# Patient Record
Sex: Female | Born: 1965
Health system: Southern US, Community
[De-identification: ages and names within clinical notes are randomized; demographics above are authoritative.]

## PROBLEM LIST (undated history)

## (undated) DIAGNOSIS — R0902 Hypoxemia: Secondary | ICD-10-CM

## (undated) DIAGNOSIS — J449 Chronic obstructive pulmonary disease, unspecified: Secondary | ICD-10-CM

## (undated) DIAGNOSIS — I10 Essential (primary) hypertension: Secondary | ICD-10-CM

## (undated) DIAGNOSIS — F419 Anxiety disorder, unspecified: Secondary | ICD-10-CM

## (undated) DIAGNOSIS — F329 Major depressive disorder, single episode, unspecified: Secondary | ICD-10-CM

## (undated) DIAGNOSIS — F1021 Alcohol dependence, in remission: Secondary | ICD-10-CM

## (undated) DIAGNOSIS — F32A Depression, unspecified: Secondary | ICD-10-CM

## (undated) DIAGNOSIS — K219 Gastro-esophageal reflux disease without esophagitis: Secondary | ICD-10-CM

## (undated) HISTORY — PX: HERNIA REPAIR: SHX51

## (undated) HISTORY — DX: Hypoxemia: R09.02

## (undated) HISTORY — PX: CHOLECYSTECTOMY: SHX55

## (undated) HISTORY — DX: Essential (primary) hypertension: I10

## (undated) HISTORY — DX: Depression, unspecified: F32.A

## (undated) HISTORY — DX: Anxiety disorder, unspecified: F41.9

## (undated) HISTORY — DX: Alcohol dependence, in remission: F10.21

## (undated) HISTORY — DX: Major depressive disorder, single episode, unspecified: F32.9

## (undated) HISTORY — PX: OTHER SURGICAL HISTORY: SHX169

## (undated) HISTORY — DX: Chronic obstructive pulmonary disease, unspecified: J44.9

## (undated) HISTORY — DX: Gastro-esophageal reflux disease without esophagitis: K21.9

## (undated) HISTORY — PX: APPENDECTOMY: SHX54

## (undated) HISTORY — PX: MINOR CARPAL TUNNEL: SHX6472

---

## 2003-02-22 ENCOUNTER — Emergency Department (HOSPITAL_COMMUNITY): Admission: EM | Admit: 2003-02-22 | Discharge: 2003-02-22 | Payer: Self-pay | Admitting: Emergency Medicine

## 2003-03-13 ENCOUNTER — Encounter: Admission: RE | Admit: 2003-03-13 | Discharge: 2003-03-13 | Payer: Self-pay | Admitting: Family Medicine

## 2003-05-18 ENCOUNTER — Encounter: Admission: RE | Admit: 2003-05-18 | Discharge: 2003-05-18 | Payer: Self-pay | Admitting: Family Medicine

## 2003-06-20 ENCOUNTER — Encounter: Admission: RE | Admit: 2003-06-20 | Discharge: 2003-06-20 | Payer: Self-pay | Admitting: Sports Medicine

## 2003-06-29 ENCOUNTER — Encounter: Admission: RE | Admit: 2003-06-29 | Discharge: 2003-06-29 | Payer: Self-pay | Admitting: Family Medicine

## 2003-07-07 ENCOUNTER — Encounter: Admission: RE | Admit: 2003-07-07 | Discharge: 2003-07-07 | Payer: Self-pay | Admitting: Sports Medicine

## 2003-08-02 ENCOUNTER — Encounter: Admission: RE | Admit: 2003-08-02 | Discharge: 2003-08-02 | Payer: Self-pay | Admitting: Family Medicine

## 2003-08-02 ENCOUNTER — Ambulatory Visit (HOSPITAL_COMMUNITY): Admission: RE | Admit: 2003-08-02 | Discharge: 2003-08-02 | Payer: Self-pay | Admitting: Family Medicine

## 2003-08-30 ENCOUNTER — Encounter: Admission: RE | Admit: 2003-08-30 | Discharge: 2003-08-30 | Payer: Self-pay | Admitting: Family Medicine

## 2003-09-05 ENCOUNTER — Encounter: Admission: RE | Admit: 2003-09-05 | Discharge: 2003-09-05 | Payer: Self-pay | Admitting: Family Medicine

## 2003-10-03 ENCOUNTER — Encounter: Admission: RE | Admit: 2003-10-03 | Discharge: 2003-10-03 | Payer: Self-pay | Admitting: Sports Medicine

## 2003-10-17 ENCOUNTER — Encounter: Admission: RE | Admit: 2003-10-17 | Discharge: 2003-10-17 | Payer: Self-pay | Admitting: Family Medicine

## 2003-11-01 ENCOUNTER — Encounter: Admission: RE | Admit: 2003-11-01 | Discharge: 2003-11-01 | Payer: Self-pay | Admitting: Family Medicine

## 2003-11-01 ENCOUNTER — Encounter
Admission: RE | Admit: 2003-11-01 | Discharge: 2003-12-05 | Payer: Self-pay | Admitting: Physical Medicine and Rehabilitation

## 2003-12-05 ENCOUNTER — Encounter
Admission: RE | Admit: 2003-12-05 | Discharge: 2004-03-04 | Payer: Self-pay | Admitting: Physical Medicine and Rehabilitation

## 2003-12-06 ENCOUNTER — Ambulatory Visit: Payer: Self-pay | Admitting: Physical Medicine and Rehabilitation

## 2003-12-13 ENCOUNTER — Ambulatory Visit: Payer: Self-pay | Admitting: Family Medicine

## 2003-12-28 ENCOUNTER — Ambulatory Visit: Payer: Self-pay | Admitting: Family Medicine

## 2004-01-03 ENCOUNTER — Ambulatory Visit: Payer: Self-pay | Admitting: Sports Medicine

## 2004-01-28 ENCOUNTER — Emergency Department (HOSPITAL_COMMUNITY): Admission: EM | Admit: 2004-01-28 | Discharge: 2004-01-28 | Payer: Self-pay | Admitting: *Deleted

## 2004-02-07 ENCOUNTER — Ambulatory Visit: Payer: Self-pay | Admitting: Family Medicine

## 2004-03-08 ENCOUNTER — Ambulatory Visit: Payer: Self-pay | Admitting: Family Medicine

## 2004-03-12 ENCOUNTER — Encounter: Admission: RE | Admit: 2004-03-12 | Discharge: 2004-04-22 | Payer: Self-pay | Admitting: Sports Medicine

## 2004-03-13 ENCOUNTER — Ambulatory Visit: Payer: Self-pay | Admitting: Internal Medicine

## 2004-03-27 ENCOUNTER — Encounter
Admission: RE | Admit: 2004-03-27 | Discharge: 2004-06-20 | Payer: Self-pay | Admitting: Physical Medicine and Rehabilitation

## 2004-03-29 ENCOUNTER — Ambulatory Visit: Payer: Self-pay | Admitting: Physical Medicine and Rehabilitation

## 2004-04-01 ENCOUNTER — Ambulatory Visit: Payer: Self-pay | Admitting: Internal Medicine

## 2004-04-09 ENCOUNTER — Ambulatory Visit: Payer: Self-pay | Admitting: Family Medicine

## 2004-04-16 ENCOUNTER — Ambulatory Visit: Payer: Self-pay | Admitting: Family Medicine

## 2004-04-19 ENCOUNTER — Ambulatory Visit: Payer: Self-pay | Admitting: Family Medicine

## 2004-04-24 ENCOUNTER — Ambulatory Visit: Payer: Self-pay | Admitting: Internal Medicine

## 2004-05-08 ENCOUNTER — Ambulatory Visit: Payer: Self-pay | Admitting: Family Medicine

## 2004-05-27 ENCOUNTER — Ambulatory Visit (HOSPITAL_COMMUNITY): Admission: RE | Admit: 2004-05-27 | Discharge: 2004-05-27 | Payer: Self-pay | Admitting: Obstetrics and Gynecology

## 2004-05-28 ENCOUNTER — Ambulatory Visit: Payer: Self-pay | Admitting: Family Medicine

## 2004-05-29 ENCOUNTER — Ambulatory Visit: Payer: Self-pay | Admitting: Physical Medicine and Rehabilitation

## 2004-05-29 ENCOUNTER — Encounter: Admission: RE | Admit: 2004-05-29 | Discharge: 2004-05-29 | Payer: Self-pay | Admitting: Obstetrics and Gynecology

## 2004-06-11 ENCOUNTER — Ambulatory Visit: Payer: Self-pay | Admitting: Family Medicine

## 2004-06-17 ENCOUNTER — Ambulatory Visit: Payer: Self-pay | Admitting: Sports Medicine

## 2004-06-20 ENCOUNTER — Encounter
Admission: RE | Admit: 2004-06-20 | Discharge: 2004-09-18 | Payer: Self-pay | Admitting: Physical Medicine and Rehabilitation

## 2004-06-25 ENCOUNTER — Ambulatory Visit (HOSPITAL_COMMUNITY): Admission: RE | Admit: 2004-06-25 | Discharge: 2004-06-25 | Payer: Self-pay | Admitting: Obstetrics and Gynecology

## 2004-07-11 ENCOUNTER — Encounter: Admission: RE | Admit: 2004-07-11 | Discharge: 2004-07-11 | Payer: Self-pay | Admitting: Obstetrics and Gynecology

## 2004-07-14 ENCOUNTER — Encounter: Admission: RE | Admit: 2004-07-14 | Discharge: 2004-07-14 | Payer: Self-pay | Admitting: Diagnostic Radiology

## 2004-07-17 ENCOUNTER — Ambulatory Visit: Payer: Self-pay | Admitting: Family Medicine

## 2004-07-26 ENCOUNTER — Ambulatory Visit: Payer: Self-pay | Admitting: Internal Medicine

## 2004-08-08 ENCOUNTER — Ambulatory Visit: Payer: Self-pay | Admitting: Family Medicine

## 2004-08-20 ENCOUNTER — Ambulatory Visit: Payer: Self-pay | Admitting: Sports Medicine

## 2004-09-02 ENCOUNTER — Ambulatory Visit (HOSPITAL_COMMUNITY): Admission: RE | Admit: 2004-09-02 | Discharge: 2004-09-02 | Payer: Self-pay | Admitting: General Surgery

## 2004-09-23 ENCOUNTER — Ambulatory Visit: Payer: Self-pay | Admitting: Sports Medicine

## 2004-09-24 ENCOUNTER — Ambulatory Visit (HOSPITAL_COMMUNITY): Admission: RE | Admit: 2004-09-24 | Discharge: 2004-09-24 | Payer: Self-pay | Admitting: Family Medicine

## 2004-10-22 ENCOUNTER — Ambulatory Visit: Payer: Self-pay | Admitting: Family Medicine

## 2004-10-29 ENCOUNTER — Ambulatory Visit: Payer: Self-pay | Admitting: Physical Medicine and Rehabilitation

## 2004-10-29 ENCOUNTER — Encounter
Admission: RE | Admit: 2004-10-29 | Discharge: 2005-01-27 | Payer: Self-pay | Admitting: Physical Medicine and Rehabilitation

## 2004-11-12 ENCOUNTER — Encounter
Admission: RE | Admit: 2004-11-12 | Discharge: 2005-02-10 | Payer: Self-pay | Admitting: Physical Medicine and Rehabilitation

## 2004-11-12 ENCOUNTER — Ambulatory Visit: Payer: Self-pay | Admitting: Psychology

## 2004-11-13 ENCOUNTER — Ambulatory Visit: Payer: Self-pay | Admitting: Internal Medicine

## 2004-11-14 ENCOUNTER — Encounter: Admission: RE | Admit: 2004-11-14 | Discharge: 2004-11-14 | Payer: Self-pay | Admitting: Surgery

## 2004-11-21 ENCOUNTER — Ambulatory Visit: Payer: Self-pay | Admitting: Family Medicine

## 2004-12-26 ENCOUNTER — Ambulatory Visit: Payer: Self-pay | Admitting: Family Medicine

## 2004-12-26 ENCOUNTER — Encounter: Admission: RE | Admit: 2004-12-26 | Discharge: 2004-12-26 | Payer: Self-pay | Admitting: Surgery

## 2005-01-16 ENCOUNTER — Ambulatory Visit (HOSPITAL_BASED_OUTPATIENT_CLINIC_OR_DEPARTMENT_OTHER): Admission: RE | Admit: 2005-01-16 | Discharge: 2005-01-16 | Payer: Self-pay | Admitting: Sports Medicine

## 2005-01-19 ENCOUNTER — Ambulatory Visit: Payer: Self-pay | Admitting: Internal Medicine

## 2005-01-27 ENCOUNTER — Ambulatory Visit: Payer: Self-pay | Admitting: Family Medicine

## 2005-02-07 ENCOUNTER — Encounter
Admission: RE | Admit: 2005-02-07 | Discharge: 2005-05-08 | Payer: Self-pay | Admitting: Physical Medicine and Rehabilitation

## 2005-02-21 ENCOUNTER — Ambulatory Visit: Payer: Self-pay | Admitting: Physical Medicine and Rehabilitation

## 2005-02-24 ENCOUNTER — Ambulatory Visit: Payer: Self-pay | Admitting: Family Medicine

## 2005-02-24 ENCOUNTER — Other Ambulatory Visit: Admission: RE | Admit: 2005-02-24 | Discharge: 2005-02-24 | Payer: Self-pay | Admitting: Family Medicine

## 2005-03-24 ENCOUNTER — Ambulatory Visit: Payer: Self-pay | Admitting: Internal Medicine

## 2005-03-25 ENCOUNTER — Ambulatory Visit: Payer: Self-pay | Admitting: Family Medicine

## 2005-04-11 ENCOUNTER — Ambulatory Visit: Payer: Self-pay | Admitting: Physical Medicine and Rehabilitation

## 2005-04-15 ENCOUNTER — Ambulatory Visit (HOSPITAL_COMMUNITY)
Admission: RE | Admit: 2005-04-15 | Discharge: 2005-04-15 | Payer: Self-pay | Admitting: Physical Medicine and Rehabilitation

## 2005-04-30 ENCOUNTER — Ambulatory Visit: Payer: Self-pay | Admitting: Family Medicine

## 2005-05-09 ENCOUNTER — Ambulatory Visit: Payer: Self-pay | Admitting: Physical Medicine and Rehabilitation

## 2005-05-09 ENCOUNTER — Encounter
Admission: RE | Admit: 2005-05-09 | Discharge: 2005-08-07 | Payer: Self-pay | Admitting: Physical Medicine and Rehabilitation

## 2005-05-30 ENCOUNTER — Ambulatory Visit: Payer: Self-pay | Admitting: Family Medicine

## 2005-06-15 ENCOUNTER — Emergency Department (HOSPITAL_COMMUNITY): Admission: AD | Admit: 2005-06-15 | Discharge: 2005-06-15 | Payer: Self-pay | Admitting: Family Medicine

## 2005-06-30 ENCOUNTER — Ambulatory Visit: Payer: Self-pay | Admitting: Family Medicine

## 2005-07-30 ENCOUNTER — Ambulatory Visit: Payer: Self-pay | Admitting: Family Medicine

## 2005-09-01 ENCOUNTER — Ambulatory Visit: Payer: Self-pay | Admitting: Family Medicine

## 2005-09-18 ENCOUNTER — Ambulatory Visit: Payer: Self-pay | Admitting: Internal Medicine

## 2005-10-31 ENCOUNTER — Ambulatory Visit: Payer: Self-pay | Admitting: Family Medicine

## 2005-11-01 ENCOUNTER — Encounter: Admission: RE | Admit: 2005-11-01 | Discharge: 2005-11-01 | Payer: Self-pay | Admitting: Sports Medicine

## 2005-11-28 ENCOUNTER — Ambulatory Visit: Payer: Self-pay | Admitting: Family Medicine

## 2005-12-05 ENCOUNTER — Encounter: Admission: RE | Admit: 2005-12-05 | Discharge: 2005-12-05 | Payer: Self-pay | Admitting: Family Medicine

## 2006-01-14 ENCOUNTER — Ambulatory Visit: Payer: Self-pay | Admitting: Family Medicine

## 2006-02-03 ENCOUNTER — Ambulatory Visit: Payer: Self-pay | Admitting: Family Medicine

## 2006-02-18 ENCOUNTER — Ambulatory Visit (HOSPITAL_COMMUNITY): Admission: RE | Admit: 2006-02-18 | Discharge: 2006-02-18 | Payer: Self-pay | Admitting: Family Medicine

## 2006-02-18 ENCOUNTER — Ambulatory Visit: Payer: Self-pay | Admitting: Sports Medicine

## 2006-02-25 ENCOUNTER — Inpatient Hospital Stay (HOSPITAL_COMMUNITY): Admission: AD | Admit: 2006-02-25 | Discharge: 2006-02-27 | Payer: Self-pay

## 2006-03-12 ENCOUNTER — Encounter: Admission: RE | Admit: 2006-03-12 | Discharge: 2006-03-12 | Payer: Self-pay | Admitting: Surgery

## 2006-04-23 ENCOUNTER — Encounter (HOSPITAL_BASED_OUTPATIENT_CLINIC_OR_DEPARTMENT_OTHER): Admission: RE | Admit: 2006-04-23 | Discharge: 2006-07-22 | Payer: Self-pay | Admitting: Surgery

## 2006-04-24 ENCOUNTER — Ambulatory Visit: Payer: Self-pay | Admitting: Family Medicine

## 2006-06-03 ENCOUNTER — Ambulatory Visit: Payer: Self-pay | Admitting: Sports Medicine

## 2006-06-03 ENCOUNTER — Other Ambulatory Visit: Admission: RE | Admit: 2006-06-03 | Discharge: 2006-06-03 | Payer: Self-pay | Admitting: Family Medicine

## 2006-06-03 ENCOUNTER — Encounter: Payer: Self-pay | Admitting: Family Medicine

## 2006-06-09 ENCOUNTER — Telehealth: Payer: Self-pay | Admitting: Family Medicine

## 2006-07-01 ENCOUNTER — Ambulatory Visit: Payer: Self-pay | Admitting: Family Medicine

## 2006-07-01 DIAGNOSIS — F339 Major depressive disorder, recurrent, unspecified: Secondary | ICD-10-CM | POA: Insufficient documentation

## 2006-07-01 DIAGNOSIS — F411 Generalized anxiety disorder: Secondary | ICD-10-CM

## 2006-07-01 DIAGNOSIS — I10 Essential (primary) hypertension: Secondary | ICD-10-CM | POA: Insufficient documentation

## 2006-07-01 DIAGNOSIS — Z720 Tobacco use: Secondary | ICD-10-CM

## 2006-07-07 ENCOUNTER — Telehealth: Payer: Self-pay | Admitting: Family Medicine

## 2006-07-17 ENCOUNTER — Ambulatory Visit: Payer: Self-pay | Admitting: Internal Medicine

## 2006-07-20 ENCOUNTER — Telehealth: Payer: Self-pay | Admitting: *Deleted

## 2006-08-11 ENCOUNTER — Ambulatory Visit: Payer: Self-pay | Admitting: Family Medicine

## 2006-08-25 ENCOUNTER — Ambulatory Visit: Payer: Self-pay | Admitting: Internal Medicine

## 2006-09-08 ENCOUNTER — Telehealth: Payer: Self-pay | Admitting: Family Medicine

## 2006-10-14 ENCOUNTER — Telehealth: Payer: Self-pay | Admitting: *Deleted

## 2006-10-14 ENCOUNTER — Ambulatory Visit: Payer: Self-pay | Admitting: Family Medicine

## 2006-10-20 ENCOUNTER — Telehealth (INDEPENDENT_AMBULATORY_CARE_PROVIDER_SITE_OTHER): Payer: Self-pay | Admitting: *Deleted

## 2006-11-04 ENCOUNTER — Encounter: Payer: Self-pay | Admitting: *Deleted

## 2006-12-21 ENCOUNTER — Telehealth: Payer: Self-pay | Admitting: Family Medicine

## 2006-12-28 ENCOUNTER — Ambulatory Visit: Payer: Self-pay | Admitting: Family Medicine

## 2006-12-28 ENCOUNTER — Encounter: Payer: Self-pay | Admitting: Family Medicine

## 2007-01-26 ENCOUNTER — Ambulatory Visit: Payer: Self-pay | Admitting: Family Medicine

## 2007-02-09 ENCOUNTER — Ambulatory Visit: Payer: Self-pay | Admitting: Family Medicine

## 2007-02-09 ENCOUNTER — Telehealth: Payer: Self-pay | Admitting: *Deleted

## 2007-02-09 ENCOUNTER — Telehealth: Payer: Self-pay | Admitting: Family Medicine

## 2007-02-24 ENCOUNTER — Ambulatory Visit: Payer: Self-pay | Admitting: Sports Medicine

## 2007-02-24 DIAGNOSIS — J449 Chronic obstructive pulmonary disease, unspecified: Secondary | ICD-10-CM | POA: Insufficient documentation

## 2007-04-01 ENCOUNTER — Ambulatory Visit: Payer: Self-pay | Admitting: Family Medicine

## 2007-04-02 ENCOUNTER — Encounter: Payer: Self-pay | Admitting: Family Medicine

## 2007-04-15 ENCOUNTER — Telehealth: Payer: Self-pay | Admitting: Family Medicine

## 2007-04-16 ENCOUNTER — Ambulatory Visit: Payer: Self-pay | Admitting: Family Medicine

## 2007-04-16 ENCOUNTER — Encounter: Payer: Self-pay | Admitting: Family Medicine

## 2007-04-19 ENCOUNTER — Ambulatory Visit: Payer: Self-pay | Admitting: Internal Medicine

## 2007-05-07 ENCOUNTER — Ambulatory Visit: Payer: Self-pay | Admitting: Family Medicine

## 2007-05-13 ENCOUNTER — Telehealth: Payer: Self-pay | Admitting: *Deleted

## 2007-05-17 ENCOUNTER — Telehealth: Payer: Self-pay | Admitting: Family Medicine

## 2007-05-18 ENCOUNTER — Encounter: Payer: Self-pay | Admitting: Family Medicine

## 2007-05-21 ENCOUNTER — Telehealth: Payer: Self-pay | Admitting: Family Medicine

## 2007-06-01 ENCOUNTER — Telehealth: Payer: Self-pay | Admitting: Family Medicine

## 2007-06-04 ENCOUNTER — Telehealth: Payer: Self-pay | Admitting: Internal Medicine

## 2007-06-04 ENCOUNTER — Telehealth: Payer: Self-pay | Admitting: *Deleted

## 2007-06-25 ENCOUNTER — Ambulatory Visit: Payer: Self-pay | Admitting: Family Medicine

## 2007-07-21 ENCOUNTER — Ambulatory Visit: Payer: Self-pay | Admitting: Family Medicine

## 2007-07-23 ENCOUNTER — Encounter: Admission: RE | Admit: 2007-07-23 | Discharge: 2007-07-23 | Payer: Self-pay | Admitting: Family Medicine

## 2007-08-03 ENCOUNTER — Telehealth: Payer: Self-pay | Admitting: *Deleted

## 2007-08-04 ENCOUNTER — Ambulatory Visit: Payer: Self-pay | Admitting: Family Medicine

## 2007-08-04 ENCOUNTER — Inpatient Hospital Stay (HOSPITAL_COMMUNITY): Admission: EM | Admit: 2007-08-04 | Discharge: 2007-08-07 | Payer: Self-pay | Admitting: Emergency Medicine

## 2007-08-05 ENCOUNTER — Telehealth: Payer: Self-pay | Admitting: *Deleted

## 2007-08-11 ENCOUNTER — Telehealth: Payer: Self-pay | Admitting: *Deleted

## 2007-08-13 ENCOUNTER — Encounter: Payer: Self-pay | Admitting: Family Medicine

## 2007-08-18 ENCOUNTER — Encounter: Admission: RE | Admit: 2007-08-18 | Discharge: 2007-08-18 | Payer: Self-pay | Admitting: Surgery

## 2007-08-19 ENCOUNTER — Encounter: Payer: Self-pay | Admitting: Family Medicine

## 2007-08-23 DIAGNOSIS — K219 Gastro-esophageal reflux disease without esophagitis: Secondary | ICD-10-CM

## 2007-08-24 ENCOUNTER — Ambulatory Visit: Payer: Self-pay | Admitting: Internal Medicine

## 2007-08-25 ENCOUNTER — Ambulatory Visit: Payer: Self-pay | Admitting: Family Medicine

## 2007-08-27 ENCOUNTER — Telehealth (INDEPENDENT_AMBULATORY_CARE_PROVIDER_SITE_OTHER): Payer: Self-pay | Admitting: Family Medicine

## 2007-09-01 ENCOUNTER — Ambulatory Visit: Payer: Self-pay | Admitting: Family Medicine

## 2007-10-01 ENCOUNTER — Encounter: Payer: Self-pay | Admitting: Family Medicine

## 2007-10-01 ENCOUNTER — Ambulatory Visit: Payer: Self-pay | Admitting: Family Medicine

## 2007-10-01 LAB — CONVERTED CEMR LAB
Cholesterol: 169 mg/dL (ref 0–200)
LDL Cholesterol: 106 mg/dL — ABNORMAL HIGH (ref 0–99)
Triglycerides: 118 mg/dL (ref <150)

## 2007-10-12 ENCOUNTER — Encounter: Payer: Self-pay | Admitting: Family Medicine

## 2007-11-03 ENCOUNTER — Ambulatory Visit: Payer: Self-pay | Admitting: Family Medicine

## 2007-11-19 ENCOUNTER — Ambulatory Visit: Payer: Self-pay | Admitting: Family Medicine

## 2007-11-19 ENCOUNTER — Inpatient Hospital Stay (HOSPITAL_COMMUNITY): Admission: AD | Admit: 2007-11-19 | Discharge: 2007-11-22 | Payer: Self-pay | Admitting: Emergency Medicine

## 2007-11-23 ENCOUNTER — Telehealth: Payer: Self-pay | Admitting: Family Medicine

## 2007-12-22 ENCOUNTER — Telehealth: Payer: Self-pay | Admitting: *Deleted

## 2007-12-22 ENCOUNTER — Ambulatory Visit (HOSPITAL_COMMUNITY): Admission: RE | Admit: 2007-12-22 | Discharge: 2007-12-22 | Payer: Self-pay | Admitting: Anesthesiology

## 2007-12-24 ENCOUNTER — Ambulatory Visit: Payer: Self-pay | Admitting: Family Medicine

## 2008-01-07 ENCOUNTER — Ambulatory Visit (HOSPITAL_COMMUNITY): Admission: RE | Admit: 2008-01-07 | Discharge: 2008-01-07 | Payer: Self-pay | Admitting: Emergency Medicine

## 2008-01-19 ENCOUNTER — Telehealth: Payer: Self-pay | Admitting: Family Medicine

## 2008-01-19 ENCOUNTER — Ambulatory Visit: Payer: Self-pay | Admitting: Family Medicine

## 2008-01-26 ENCOUNTER — Telehealth: Payer: Self-pay | Admitting: *Deleted

## 2008-02-15 ENCOUNTER — Encounter: Admission: RE | Admit: 2008-02-15 | Discharge: 2008-02-15 | Payer: Self-pay | Admitting: Family Medicine

## 2008-02-15 ENCOUNTER — Ambulatory Visit: Payer: Self-pay | Admitting: Family Medicine

## 2008-02-15 DIAGNOSIS — M25569 Pain in unspecified knee: Secondary | ICD-10-CM | POA: Insufficient documentation

## 2008-02-24 ENCOUNTER — Encounter: Admission: RE | Admit: 2008-02-24 | Discharge: 2008-02-24 | Payer: Self-pay | Admitting: Anesthesiology

## 2008-03-06 ENCOUNTER — Encounter: Payer: Self-pay | Admitting: Family Medicine

## 2008-03-06 ENCOUNTER — Ambulatory Visit: Payer: Self-pay | Admitting: Family Medicine

## 2008-03-06 LAB — CONVERTED CEMR LAB
ALT: 11 units/L (ref 0–35)
Albumin: 4 g/dL (ref 3.5–5.2)
BUN: 13 mg/dL (ref 6–23)
CO2: 29 meq/L (ref 19–32)
Calcium: 9.1 mg/dL (ref 8.4–10.5)
Chloride: 99 meq/L (ref 96–112)
Creatinine, Ser: 0.77 mg/dL (ref 0.40–1.20)
Potassium: 4.5 meq/L (ref 3.5–5.3)

## 2008-03-14 ENCOUNTER — Ambulatory Visit: Payer: Self-pay | Admitting: Internal Medicine

## 2008-03-15 ENCOUNTER — Ambulatory Visit: Payer: Self-pay | Admitting: Internal Medicine

## 2008-03-15 ENCOUNTER — Encounter: Admission: RE | Admit: 2008-03-15 | Discharge: 2008-03-15 | Payer: Self-pay | Admitting: Orthopedic Surgery

## 2008-03-16 ENCOUNTER — Telehealth: Payer: Self-pay | Admitting: Internal Medicine

## 2008-03-21 ENCOUNTER — Inpatient Hospital Stay (HOSPITAL_COMMUNITY): Admission: RE | Admit: 2008-03-21 | Discharge: 2008-03-22 | Payer: Self-pay | Admitting: Orthopedic Surgery

## 2008-03-21 ENCOUNTER — Encounter (INDEPENDENT_AMBULATORY_CARE_PROVIDER_SITE_OTHER): Payer: Self-pay | Admitting: Orthopedic Surgery

## 2008-03-27 ENCOUNTER — Ambulatory Visit: Payer: Self-pay | Admitting: Family Medicine

## 2008-03-29 ENCOUNTER — Encounter: Admission: RE | Admit: 2008-03-29 | Discharge: 2008-03-29 | Payer: Self-pay | Admitting: Orthopedic Surgery

## 2008-04-13 ENCOUNTER — Encounter: Payer: Self-pay | Admitting: Family Medicine

## 2008-05-24 ENCOUNTER — Ambulatory Visit: Payer: Self-pay | Admitting: Family Medicine

## 2008-05-25 ENCOUNTER — Encounter: Admission: RE | Admit: 2008-05-25 | Discharge: 2008-05-25 | Payer: Self-pay | Admitting: Orthopedic Surgery

## 2008-05-25 ENCOUNTER — Telehealth: Payer: Self-pay | Admitting: Family Medicine

## 2008-06-06 ENCOUNTER — Encounter: Payer: Self-pay | Admitting: Family Medicine

## 2008-06-07 ENCOUNTER — Encounter: Admission: RE | Admit: 2008-06-07 | Discharge: 2008-06-07 | Payer: Self-pay | Admitting: Surgery

## 2008-06-27 ENCOUNTER — Ambulatory Visit: Payer: Self-pay | Admitting: Family Medicine

## 2008-08-02 ENCOUNTER — Encounter: Payer: Self-pay | Admitting: Family Medicine

## 2008-08-04 ENCOUNTER — Telehealth: Payer: Self-pay | Admitting: Family Medicine

## 2008-08-08 ENCOUNTER — Telehealth: Payer: Self-pay | Admitting: *Deleted

## 2008-08-18 ENCOUNTER — Ambulatory Visit: Payer: Self-pay | Admitting: Family Medicine

## 2008-08-18 ENCOUNTER — Encounter: Payer: Self-pay | Admitting: Family Medicine

## 2008-08-23 ENCOUNTER — Encounter: Payer: Self-pay | Admitting: Family Medicine

## 2008-08-25 ENCOUNTER — Inpatient Hospital Stay (HOSPITAL_COMMUNITY): Admission: RE | Admit: 2008-08-25 | Discharge: 2008-08-30 | Payer: Self-pay | Admitting: Surgery

## 2008-08-25 ENCOUNTER — Ambulatory Visit: Payer: Self-pay | Admitting: Internal Medicine

## 2008-08-26 ENCOUNTER — Ambulatory Visit: Payer: Self-pay | Admitting: Internal Medicine

## 2008-08-28 ENCOUNTER — Encounter: Payer: Self-pay | Admitting: Internal Medicine

## 2008-09-22 ENCOUNTER — Ambulatory Visit: Payer: Self-pay | Admitting: Family Medicine

## 2008-09-26 ENCOUNTER — Telehealth: Payer: Self-pay | Admitting: Family Medicine

## 2008-09-28 ENCOUNTER — Encounter: Payer: Self-pay | Admitting: Family Medicine

## 2008-09-28 ENCOUNTER — Ambulatory Visit: Payer: Self-pay | Admitting: Family Medicine

## 2008-10-03 ENCOUNTER — Encounter: Payer: Self-pay | Admitting: Family Medicine

## 2008-10-03 ENCOUNTER — Encounter: Payer: Self-pay | Admitting: Internal Medicine

## 2008-10-03 LAB — CONVERTED CEMR LAB
ALT: 18 units/L (ref 0–35)
AST: 20 units/L (ref 0–37)
CO2: 26 meq/L (ref 19–32)
Creatinine, Ser: 0.7 mg/dL (ref 0.40–1.20)
Direct LDL: 126 mg/dL — ABNORMAL HIGH
Sodium: 140 meq/L (ref 135–145)
Total Bilirubin: 0.4 mg/dL (ref 0.3–1.2)
Total Protein: 6.2 g/dL (ref 6.0–8.3)

## 2008-10-09 ENCOUNTER — Ambulatory Visit: Payer: Self-pay | Admitting: Internal Medicine

## 2008-10-10 ENCOUNTER — Telehealth (INDEPENDENT_AMBULATORY_CARE_PROVIDER_SITE_OTHER): Payer: Self-pay | Admitting: *Deleted

## 2008-10-11 ENCOUNTER — Ambulatory Visit: Payer: Self-pay | Admitting: Internal Medicine

## 2008-11-03 ENCOUNTER — Ambulatory Visit: Payer: Self-pay | Admitting: Family Medicine

## 2008-11-17 ENCOUNTER — Encounter: Admission: RE | Admit: 2008-11-17 | Discharge: 2008-11-17 | Payer: Self-pay | Admitting: Anesthesiology

## 2008-12-04 ENCOUNTER — Encounter: Payer: Self-pay | Admitting: Family Medicine

## 2008-12-04 ENCOUNTER — Encounter: Payer: Self-pay | Admitting: Internal Medicine

## 2009-01-09 ENCOUNTER — Encounter: Payer: Self-pay | Admitting: Family Medicine

## 2009-01-22 ENCOUNTER — Encounter: Admission: RE | Admit: 2009-01-22 | Discharge: 2009-01-22 | Payer: Self-pay | Admitting: Anesthesiology

## 2009-01-22 ENCOUNTER — Ambulatory Visit: Payer: Self-pay | Admitting: Family Medicine

## 2009-01-25 ENCOUNTER — Encounter: Payer: Self-pay | Admitting: Family Medicine

## 2009-03-14 ENCOUNTER — Encounter: Payer: Self-pay | Admitting: Family Medicine

## 2009-03-17 ENCOUNTER — Telehealth: Payer: Self-pay | Admitting: Family Medicine

## 2009-03-29 ENCOUNTER — Telehealth: Payer: Self-pay | Admitting: Family Medicine

## 2009-06-01 ENCOUNTER — Ambulatory Visit: Payer: Self-pay | Admitting: Family Medicine

## 2009-06-08 ENCOUNTER — Encounter: Admission: RE | Admit: 2009-06-08 | Discharge: 2009-06-08 | Payer: Self-pay | Admitting: Anesthesiology

## 2009-06-12 ENCOUNTER — Ambulatory Visit: Payer: Self-pay | Admitting: Family Medicine

## 2009-06-18 ENCOUNTER — Encounter: Payer: Self-pay | Admitting: Family Medicine

## 2009-06-22 ENCOUNTER — Telehealth: Payer: Self-pay | Admitting: Family Medicine

## 2009-06-28 ENCOUNTER — Ambulatory Visit: Payer: Self-pay | Admitting: Family Medicine

## 2009-06-28 IMAGING — CT CT CERVICAL SPINE W/O CM
3 of 4 series · 16 of 28 positions shown, 18 images · non-contrast
Comparison: Technically limited MRI 02/24/2008.

CLINICAL DATA: Neck pain.  Bilateral shoulder pain.  History of
fusion in  4446.

CT CERVICAL SPINE WITHOUT CONTRAST
TECHNIQUE: Multidetector CT imaging of the cervical spine was
performed. Multiplanar CT image reconstructions were also
generated.

[Series 2: cervical spine · axial · 0.23mm/px · z∈[+90,+200]mm · 5 of 132 slices shown, 7 images]
[im 22/132  soft-tissue]
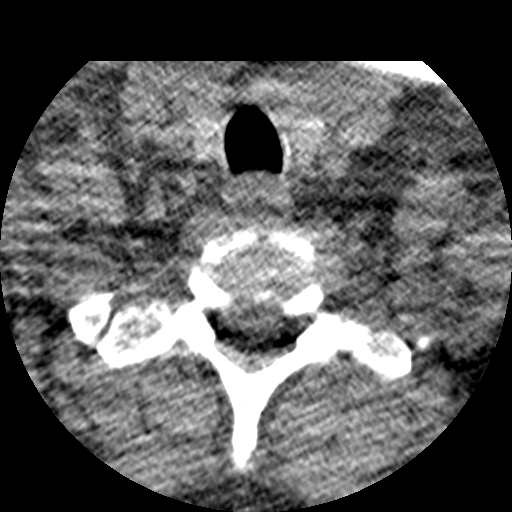
[im 22/132  bone]
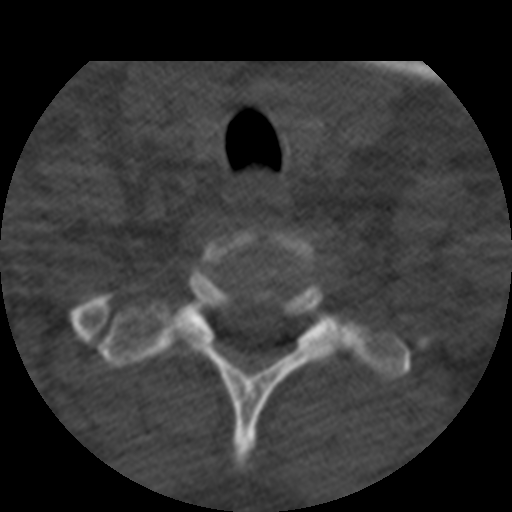
[im 44/132  bone]
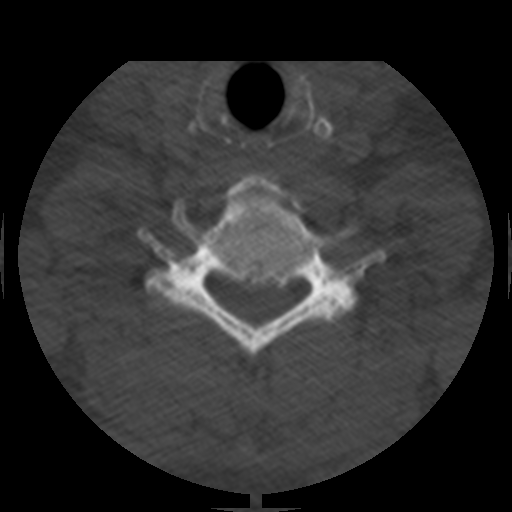
[im 66/132  bone]
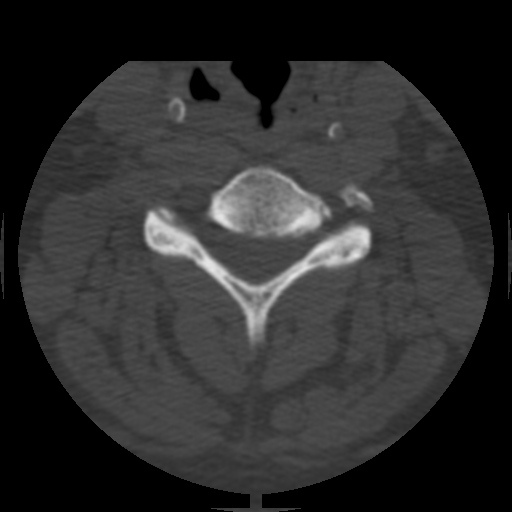
[im 88/132  bone]
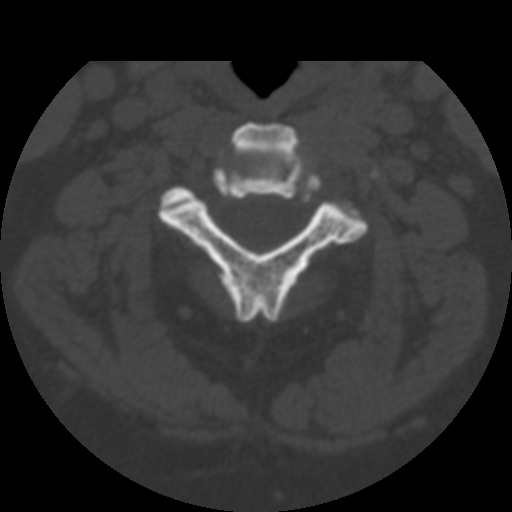
[im 110/132  soft-tissue]
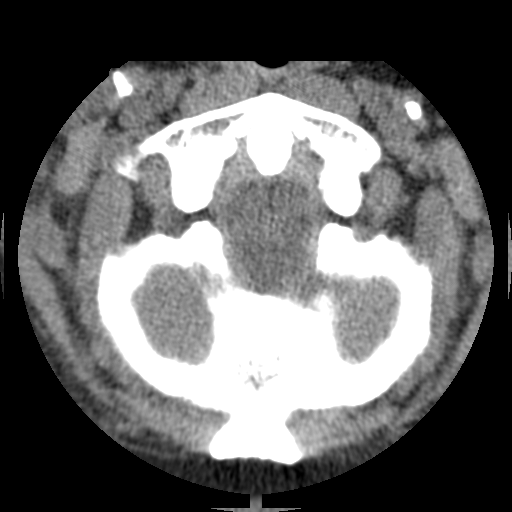
[im 110/132  bone]
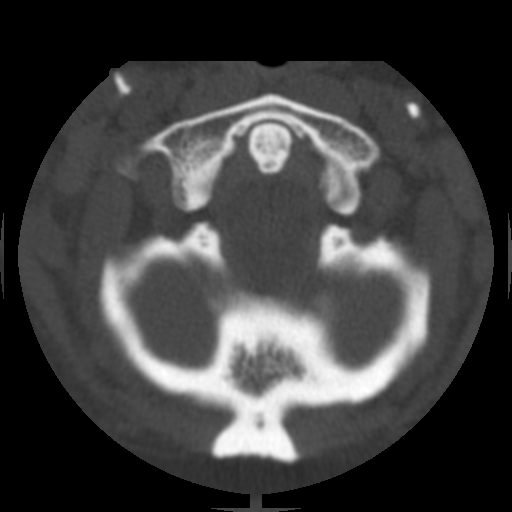

[Series 3: bone windows · axial · 0.23mm/px · z∈[+90,+200]mm · 5 of 132 slices shown]
[im 22/132  bone]
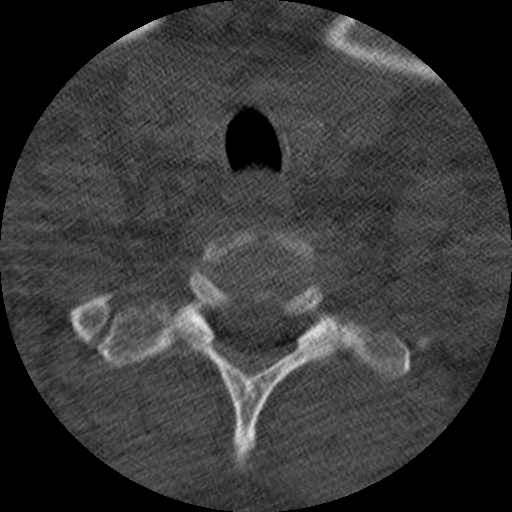
[im 44/132  bone]
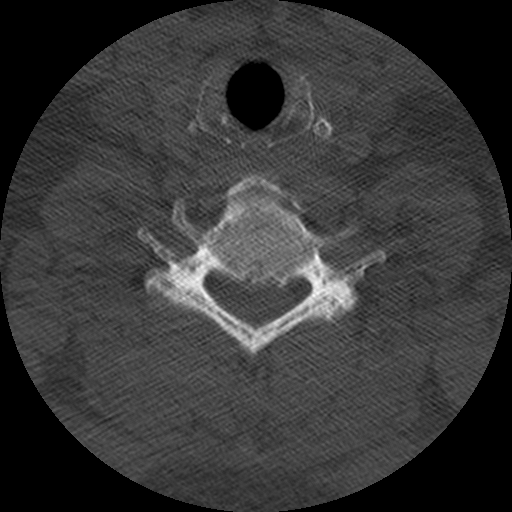
[im 66/132  bone]
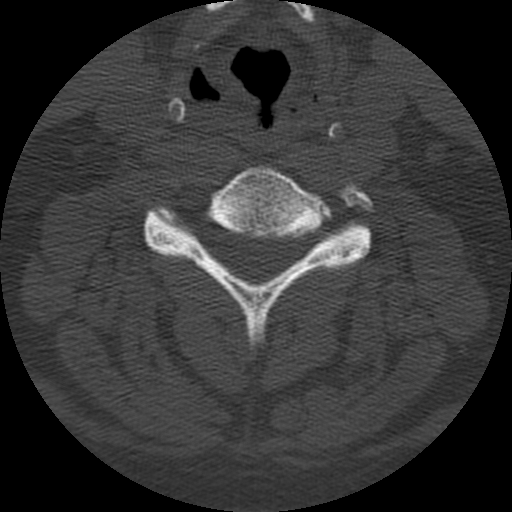
[im 88/132  bone]
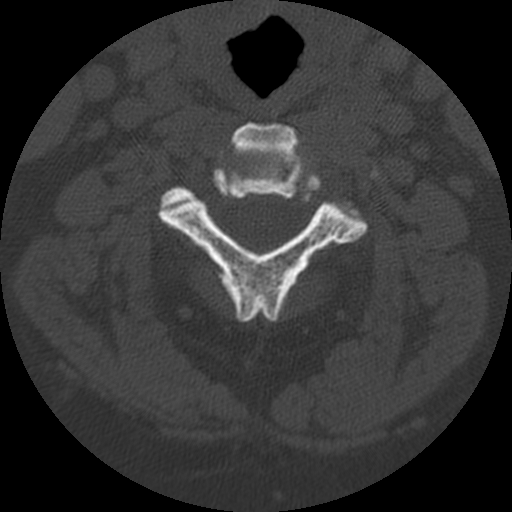
[im 110/132  bone]
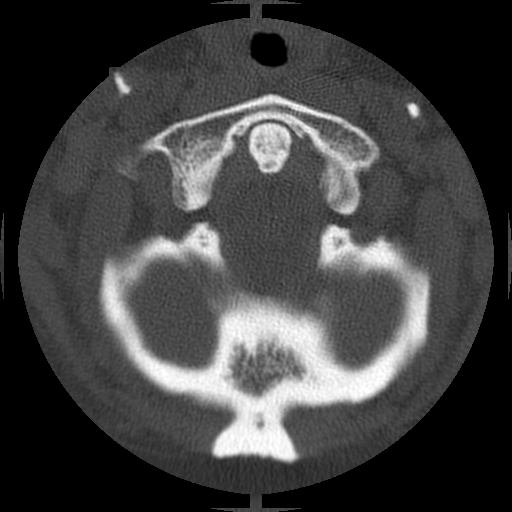

[Series 400: coronal · coronal · 0.33mm/px · 6 of 40 slices shown]
[im 2/40  soft-tissue]
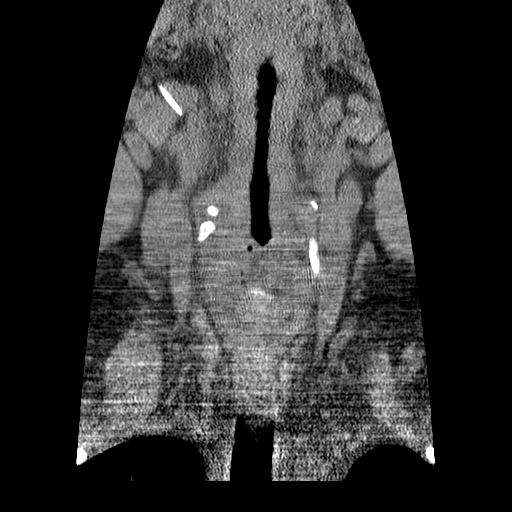
[im 7/40  bone]
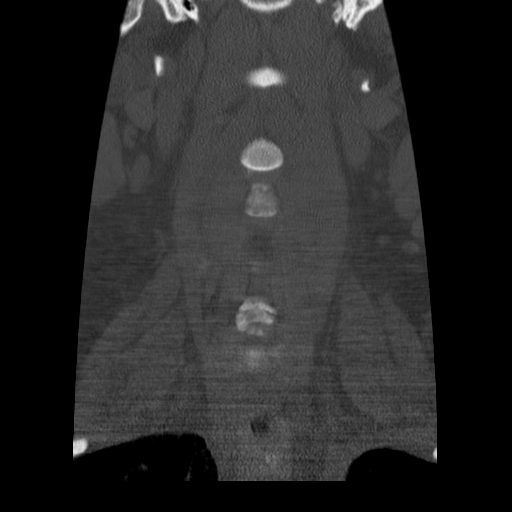
[im 14/40  bone]
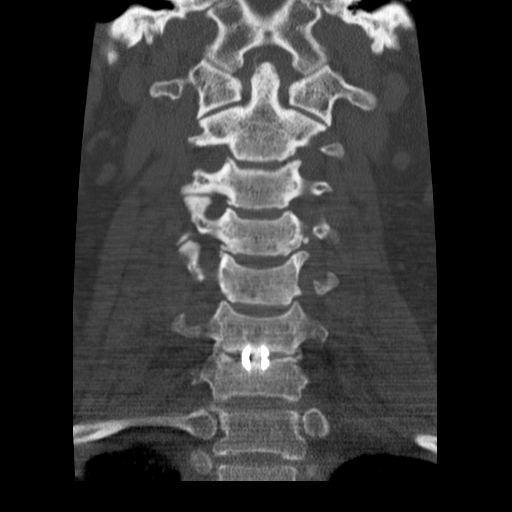
[im 20/40  bone]
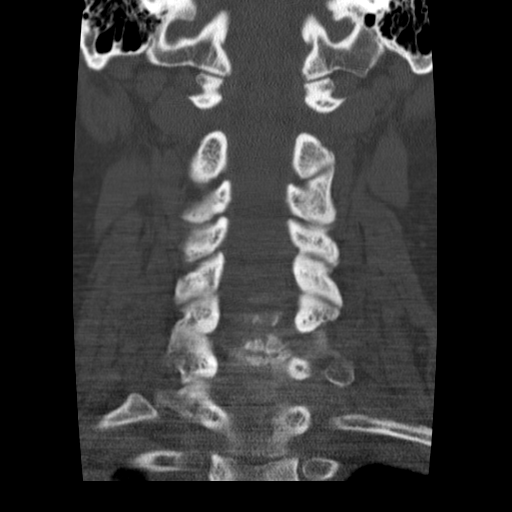
[im 27/40  bone]
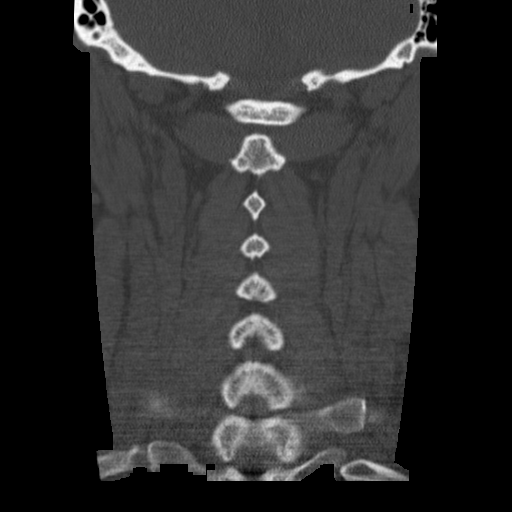
[im 33/40  bone]
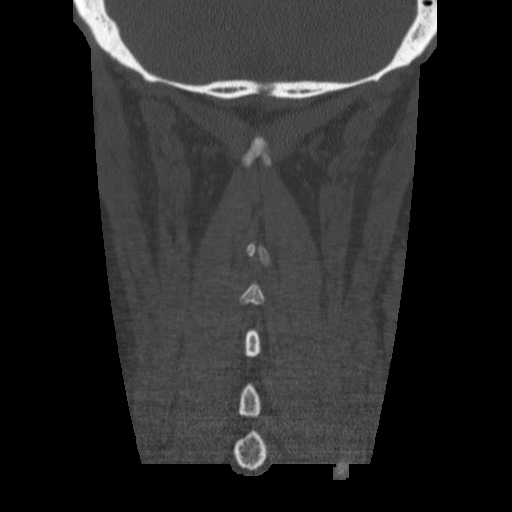

[16 of 28 positions shown; findings below may reference images not displayed]

FINDINGS: This exam has scatter artifact in the lower cervical
spine associated with the patient's body habitus.  There is
straightening of the normal cervical lordosis with slight reversal
centered from C5-C7.  Alignment in the AP plane is anatomic.
Atlantodental degenerative disease is present.  Craniocervical
alignment is normal.  Grossly, the noncontrast appearance of
posterior fossa structures is within normal limits. The AP diameter
of the central canal appears congenitally narrow, especially when
correlated with prior MRI.

C2-C3:  Left uncovertebral spur results in left foraminal
narrowing.  Mild right uncovertebral spurring is present without
significant stenosis.  Facet and central canal show no significant
bony stenosis.

C3-C4:  Mild bilateral facet degeneration.  No bony central or
foraminal stenosis.  Mild right uncovertebral spurring.

C4-C5:  Mild bilateral uncovertebral spurring without significant
foraminal stenosis identified.  No bony central stenosis.  Small
endplate osteophytes contribute to a mild bony central stenosis.

C5-C6:  Degenerated disc.  Facets appear within normal limits.
Neural foramina patent bilaterally.  Posterior ligamentum flavum
redundancy is present there is a shallow disc osteophyte complex.

C6-C7:  Discectomy with ray cage fusion.  Bridging bone is present
through the cage graft consistent with completed fusion.  Facets
are fused posteriorly.  Osteophytic spurring that has residual
spurring is present at the endplates but no definite bony central
stenosis.  Neural foramina appear patent.

C7-T1:  Negative.
IMPRESSION: 1.  Completed fusion at C6-C7 with bridging bone across ray cage
graft. Residual osteophytic spurring produces a mild bony stenosis
of the central canal.
2.  Left C2-C3 uncovertebral spur with left foraminal narrowing.
3.  Congenitally narrow AP diameter of the central canal.
4. C5-C6 spondylosis with disc space loss and shallow broad-based
posterior disc osteophyte complex.
5.  Small C4-C5 disc osteophyte complex posteriorly.

## 2009-07-19 ENCOUNTER — Telehealth: Payer: Self-pay | Admitting: Family Medicine

## 2009-07-23 ENCOUNTER — Ambulatory Visit: Payer: Self-pay | Admitting: Family Medicine

## 2009-08-23 ENCOUNTER — Encounter: Payer: Self-pay | Admitting: Family Medicine

## 2009-09-25 ENCOUNTER — Encounter: Admission: RE | Admit: 2009-09-25 | Discharge: 2009-09-25 | Payer: Self-pay | Admitting: Anesthesiology

## 2009-11-07 ENCOUNTER — Encounter: Payer: Self-pay | Admitting: Family Medicine

## 2009-12-06 ENCOUNTER — Ambulatory Visit: Payer: Self-pay | Admitting: Family Medicine

## 2009-12-06 DIAGNOSIS — M549 Dorsalgia, unspecified: Secondary | ICD-10-CM | POA: Insufficient documentation

## 2009-12-12 ENCOUNTER — Ambulatory Visit: Payer: Self-pay | Admitting: Family Medicine

## 2009-12-19 ENCOUNTER — Ambulatory Visit: Payer: Self-pay | Admitting: Family Medicine

## 2010-01-03 ENCOUNTER — Telehealth (INDEPENDENT_AMBULATORY_CARE_PROVIDER_SITE_OTHER): Payer: Self-pay | Admitting: *Deleted

## 2010-01-11 ENCOUNTER — Encounter: Payer: Self-pay | Admitting: Family Medicine

## 2010-01-15 ENCOUNTER — Telehealth: Payer: Self-pay | Admitting: Family Medicine

## 2010-01-23 ENCOUNTER — Encounter: Payer: Self-pay | Admitting: Family Medicine

## 2010-01-23 ENCOUNTER — Ambulatory Visit: Payer: Self-pay | Admitting: Family Medicine

## 2010-01-23 DIAGNOSIS — B372 Candidiasis of skin and nail: Secondary | ICD-10-CM

## 2010-01-23 DIAGNOSIS — J441 Chronic obstructive pulmonary disease with (acute) exacerbation: Secondary | ICD-10-CM | POA: Insufficient documentation

## 2010-02-13 ENCOUNTER — Telehealth: Payer: Self-pay | Admitting: Family Medicine

## 2010-02-22 ENCOUNTER — Ambulatory Visit: Payer: Self-pay | Admitting: Family Medicine

## 2010-02-22 DIAGNOSIS — S99929A Unspecified injury of unspecified foot, initial encounter: Secondary | ICD-10-CM

## 2010-02-22 DIAGNOSIS — G4700 Insomnia, unspecified: Secondary | ICD-10-CM

## 2010-02-22 DIAGNOSIS — S8990XA Unspecified injury of unspecified lower leg, initial encounter: Secondary | ICD-10-CM

## 2010-02-22 DIAGNOSIS — S99919A Unspecified injury of unspecified ankle, initial encounter: Secondary | ICD-10-CM

## 2010-03-31 ENCOUNTER — Encounter: Payer: Self-pay | Admitting: Surgery

## 2010-03-31 ENCOUNTER — Encounter: Payer: Self-pay | Admitting: Family Medicine

## 2010-04-08 ENCOUNTER — Telehealth: Payer: Self-pay | Admitting: Family Medicine

## 2010-04-09 NOTE — Progress Notes (Signed)
Summary: Puritic Rash after taking Levaquin.   Medications Added HYDROXYZINE PAMOATE 25 MG CAPS (HYDROXYZINE PAMOATE) take 1 pill every 6 hours as needed for itching.  May make you sleepy, do not drive while taking med.            New/Updated Medications: HYDROXYZINE PAMOATE 25 MG CAPS (HYDROXYZINE PAMOATE) take 1 pill every 6 hours as needed for itching.  May make you sleepy, do not drive while taking med. Prescriptions: HYDROXYZINE PAMOATE 25 MG CAPS (HYDROXYZINE PAMOATE) take 1 pill every 6 hours as needed for itching.  May make you sleepy, do not drive while taking med.  #30 x 0   Entered and Authorized by:   Jamie Brookes MD   Signed by:   Jamie Brookes MD on 03/17/2009   Method used:   Electronically to        [facility]. [phone]* (retail)       [address]       [place], [place]  [address]       Ph: [phone] or [phone]       Fax: [phone]   RxID:   [id number]   Admitted to [facility] on 03-03-09 - 03-06-09 for PNA. Put on Levaquin. Broke out on 03-12-09 with a rash after 3 days. Is a puritic rash on head, neck and chest. Still has rash. Took some Benadryl but not helping. no throat swelling, no other new meds, no new foods. is eating and drinking well. No difficulty swallowing. Pt will call ot make appt to be seen on monday or will await call whichever is first. I sent in Rx for Atarax to decrease itching and gave instructions to stop stratching the rash.
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
## 2010-04-09 NOTE — Progress Notes (Signed)
Summary: Rx Req   Phone Note Call from Patient Call back at [facility] Phone [phone]   Caller: Patient Summary of Call: Wants rx for Xanax.  Says that her psychartist will not write it. Initial call taken by: Clydell Hakim,  Jul 19, 2009 10:47 AM  Follow-up for Phone Call        States that the psyciatrist will not prescribe Xanax or Ativan.  She is about to go into a nervous breakdown and needs something.  Will call in prescription for her. Follow-up by: Angelena Sole MD,  Jul 19, 2009 11:50 AM    Prescriptions: ALPRAZOLAM 1 MG TABS (ALPRAZOLAM) 1 tablet every 6- 8 hours for anxiety.  #120 x 0   Entered and Authorized by:   Angelena Sole MD   Signed by:   Angelena Sole MD on 07/19/2009   Method used:   Telephoned to ...       [facility]. [phone]* (retail)       [address]       [place], [place]  [address]       Ph: [phone] or [phone]       Fax: [phone]   RxID:   [id number]
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
## 2010-04-09 NOTE — Miscellaneous (Signed)
Summary: alprazolam   Clinical Lists Changes pharmacist called from [facility] asking if the md wantes the new rx for alprazolam filled. she just picked up #60 on 08/21/09 writtenn by Dr. Lelon Perla.  told pharmacist to hold it as this was likely an oversight. will send to md for confirmation...Marland KitchenMarland KitchenGolden Circle RN  August 23, 2009 2:55 PM     Agree with action taken.  Paula Compton MD  August 24, 2009 9:26 AM

## 2010-04-09 NOTE — Progress Notes (Signed)
Summary: results  Medications Added OXYCODONE HCL 5 MG TABS (OXYCODONE HCL) 1 tablet every 6 hours if needed for pain       Phone Note Call from Patient Call back at Home Phone [phone]   Caller: Patient Summary of Call: wants to know results of xray of hip Initial call taken by: De Nurse,  June 22, 2009 11:21 AM  Follow-up for Phone Call        told her nothing acute, no fx. she has narcotic meds she is taking. made appt for f/u with pcp next Thursday Follow-up by: Golden Circle RN,  June 22, 2009 11:24 AM  Additional Follow-up for Phone Call Additional follow up Details #1::        Pt came in today with son, wants to know if she can have a reill on her pain med to last until her appt with PCP. Additional Follow-up by: Garen Grams LPN,  June 22, 2009 1:57 PM    Additional Follow-up for Phone Call Additional follow up Details #2::    Patient given handwritten Rx for Oxycodone 5 mg tab, 1 tab every 6 hours as needed. disp: 12 tab.  Any further refills must come from patient's primary MD.   Follow-up by: Tawanna Cooler McDiarmid MD,  June 22, 2009 2:11 PM  New/Updated Medications: OXYCODONE HCL 5 MG TABS (OXYCODONE HCL) 1 tablet every 6 hours if needed for pain Prescriptions: OXYCODONE HCL 5 MG TABS (OXYCODONE HCL) 1 tablet every 6 hours if needed for pain  #12 x 0   Entered by:   Tawanna Cooler McDiarmid MD   Authorized by:   Angelena Sole MD   Signed by:   Tawanna Cooler McDiarmid MD on 06/22/2009   Method used:   Handwritten   RxID:   [id number]
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
## 2010-04-09 NOTE — Progress Notes (Signed)
Summary: refill   Phone Note Refill Request Call back at Home Phone [phone] Message from:  Patient  Refills Requested: Medication #1:  ALPRAZOLAM 1 MG TABS 1 tablet every 6- 8 hours for anxiety. Initial call taken by: De Nurse,  February 13, 2010 8:56 AM    Prescriptions: ALPRAZOLAM 1 MG TABS (ALPRAZOLAM) 1 tablet every 6- 8 hours for anxiety.  #90 x 0   Entered and Authorized by:   Angelena Sole MD   Signed by:   Angelena Sole MD on 02/13/2010   Method used:   Print then Give to Patient   RxID:   [id number]  rx called to pharmacy at patient's request . printed rx torn up and placed in shred box. Theresia Lo RN  February 14, 2010 10:36 AM

## 2010-04-09 NOTE — Miscellaneous (Signed)
Summary: Injection Orders / [facility]    Injection Orders / [facility]    Imported By: Lennie Odor 08/07/2009 15:35:37  _____________________________________________________________________  External Attachment:    Type:   Image     Comment:   External Document

## 2010-04-09 NOTE — Progress Notes (Signed)
Summary: Rx Req   Phone Note Refill Request Call back at Work Phone [phone] Message from:  Patient  Refills Requested: Medication #1:  ALPRAZOLAM 1 MG TABS 1 tablet every 6- 8 hours for anxiety. PT USES [facility] IN [address]  Initial call taken by: Clydell Hakim,  March 29, 2009 1:36 PM  Follow-up for Phone Call        will forward to MD. Follow-up by: Theresia Lo RN,  March 29, 2009 2:24 PM  Additional Follow-up for Phone Call Additional follow up Details #1::        Refill was already sent a couple of weeks ago Additional Follow-up by: Angelena Sole MD,  March 30, 2009 10:09 AM

## 2010-04-09 NOTE — Miscellaneous (Signed)
   Clinical Lists Changes  Problems: Removed problem of CONJUNCTIVITIS, ACUTE, BILATERAL (ICD-372.00) Removed problem of HYPOTENSION (ICD-458.9) Removed problem of DRY SKIN (ICD-701.1) Removed problem of SCREENING FOR MALIGNANT NEOPLASM OF THE CERVIX (ICD-V76.2) Removed problem of ABDOMINAL PAIN, CHRONIC (ICD-789.00) Removed problem of ASTHMA, PERSISTENT (ICD-493.90) Removed problem of COUGH (ICD-786.2) Removed problem of INSOMNIA (ICD-780.52)

## 2010-04-09 NOTE — Assessment & Plan Note (Signed)
Summary: f/u   Vital Signs:  Patient profile:   45 year old female Height:      61 inches Weight:      249.5 pounds BMI:     47.31 Temp:     98.1 degrees F oral Pulse rate:   73 / minute BP sitting:   138 / 89  (left arm) Cuff size:   regular  Vitals Entered By: Garen Grams LPN (January 23, 2010 10:49 AM) CC: f/u Is Patient Diabetic? No Pain Assessment Patient in pain? yes        Primary Care Provider:  Bobby Rumpf  MD  CC:  f/u.  History of Present Illness: 1. Bronchitis:  Pt has frequent episodes of bronchitis.  She is a long term smoker with O2 dependent COPD.  She frequently gets infections when the weather changes.  She has had increased cough, sputum production for the past week.  She has also had some left lung pain.  She thinks that she is fighting a pneumonia.    ROS: endorses low grade fever, denies chest pain, LE swelling  2. Skin yeast infection:  Pt has had some skin yeast infections before.  She used to take some cream and that helped.  The infection in under her skin folds under her breast.  3. Obesity:  She has been trying to lose some weight.  She is watching what she eats.  Has lost 10 lbs in the past month.  4. Tobacco use:  she has been trying to cut down on her cigarettes.  Is getting to the point where she is thinking about quiting.  Habits & Providers  Alcohol-Tobacco-Diet     Tobacco Status: current     Tobacco Counseling: to quit use of tobacco products     Cigarette Packs/Day: 0.25     Year Started: 1982     Year Quit: 5/09     Pack years: 27  Current Medications (verified): 1)  Advair Diskus 500-50 Mcg/dose Misc (Fluticasone-Salmeterol) .... Inhale 1 Puff As Directed Twice A Day 2)  Proair Hfa 108 (90 Base) Mcg/act Aers (Albuterol Sulfate) .... 2 Puffs 4 Times A Day 3)  Albuterol Sulfate (2.5 Mg/9ml) 0.083% Nebu (Albuterol Sulfate) .... Inhale 1 Vial Using Nebulizer Every Four Hours 4)  Singulair 10 Mg Tabs (Montelukast Sodium) ....  Take 1 Tablet By Mouth Once A Day 5)  Oxygen 2l/m Advanced 6)  Epipen 0.3 Mg/0.73ml (1:1000)  Devi (Epinephrine Hcl (Anaphylaxis)) .... For Severe Allergic Reaction 7)  Flonase 50 Mcg/act  Susp (Fluticasone Propionate) .Marland Kitchen.. 1-2 Sprays in Each Nostril Daily 8)  Alprazolam 1 Mg Tabs (Alprazolam) .Marland Kitchen.. 1 Tablet Every 6- 8 Hours For Anxiety. 9)  Prilosec Otc 20 Mg Tbec (Omeprazole Magnesium) .... Take 1 Tablet By Mouth As Directed 10)  Budeprion Xl 150 Mg  Tb24 (Bupropion Hcl) .... 3 Tablets Daily 11)  Lamotrigine 100 Mg  Tabs (Lamotrigine) .Marland Kitchen.. 1 Tablet Twice Daily 12)  Hydrochlorothiazide 25 Mg Tabs (Hydrochlorothiazide) .Marland Kitchen.. 1 Tab By Mouth Daily. 13)  Spiriva Handihaler 18 Mcg Caps (Tiotropium Bromide Monohydrate) .... Inhale Contents of One Capsule By Mouth Every Day. 14)  Lisinopril 20 Mg Tabs (Lisinopril) .... Take 1 Tablet By Mouth Once A Day 15)  Allergy Vaccine  1:10 G.o. .... Once A Week 16)  Systane 0.4-0.3 % Soln (Polyethyl Glycol-Propyl Glycol) .Marland Kitchen.. 1 Drop Each Eye Once Daily 17)  Geri-Hydrolac 12 12 % Crea (Ammonium Lactate) .... Apply 3 Times A Day Qs: For 1 Month 18)  Roxicodone 15 Mg Tabs (Oxycodone Hcl) .Marland Kitchen.. 1 Tab Q 4hrs As Needed For Pain 19)  Doxycycline Hyclate 100 Mg Tabs (Doxycycline Hyclate) .Marland Kitchen.. 1 Tab By Mouth Twice A Day For 5 Days 20)  Ambien 5 Mg Tabs (Zolpidem Tartrate) .Marland Kitchen.. 1 Tab By Mouth At Night At Bedtime For Insomnia 21)  Prednisone 20 Mg Tabs (Prednisone) .... 2 Tabs By Mouth Daily For 5 Days 22)  Miconazole Nitrate 2 % Crea (Miconazole Nitrate) .... Apply Twice A Day Until Rash Resolves  Allergies: 1)  ! Vicodin  Physical Exam  General:  obese. vitals reviewed.  no acute distress  Head:  normocephalic and atraumatic.   Eyes:  Normal appearing conjunctiva.  PERRL.  No redness or swelling Mouth:  pharynx pink and moist.   Neck:  supple, full ROM, and no masses.   Lungs:  slightly increased respiratory effort, no intercostal retractions. Bilateral wheezes, no  crackles  poor air movement at bases.  No definite consolidation. Heart:  Distant but RRR without gallop, murmur, click, rub or other extra sounds. Abdomen:  obese, S/NT/ND Extremities:  1+ LEE   Impression & Recommendations:  Problem # 1:  CHRONIC OBSTRUCTIVE PULMONARY DISEASE, ACUTE EXACERBATION (ICD-491.21) Assessment New  Pt with frequent exacerbations.  Has required hospitalization before.  Will treat with Doxy and Prednisone.    Orders: [facility]- Est  Level 4 (99214)  Problem # 2:  CANDIDIASIS, SKIN (ICD-112.3) Assessment: New  Will treat with Miconazole cream.  Orders: [facility]- Est  Level 4 (86578)  Problem # 3:  OBESITY NOS (ICD-278.00) Assessment: Improved  Encouraged her to continue making the lifestyle changes necessary for weight loss.  Orders: [facility]- Est  Level 4 (46962)  Problem # 4:  TOBACCO ABUSE (ICD-305.1) Assessment: Improved  Encouraged her to continue to try and cut down on the cigarettes.  She was not interested in any medicines to help.  Orders: [facility]- Est  Level 4 (99214)  Complete Medication List: 1)  Advair Diskus 500-50 Mcg/dose Misc (Fluticasone-salmeterol) .... Inhale 1 puff as directed twice a day 2)  Proair Hfa 108 (90 Base) Mcg/act Aers (Albuterol sulfate) .... 2 puffs 4 times a day 3)  Albuterol Sulfate (2.5 Mg/74ml) 0.083% Nebu (Albuterol sulfate) .... Inhale 1 vial using nebulizer every four hours 4)  Singulair 10 Mg Tabs (Montelukast sodium) .... Take 1 tablet by mouth once a day 5)  Oxygen 2l/m Advanced  6)  Epipen 0.3 Mg/0.88ml (1:1000) Devi (Epinephrine hcl (anaphylaxis)) .... For severe allergic reaction 7)  Flonase 50 Mcg/act Susp (Fluticasone propionate) .Marland Kitchen.. 1-2 sprays in each nostril daily 8)  Alprazolam 1 Mg Tabs (Alprazolam) .Marland Kitchen.. 1 tablet every 6- 8 hours for anxiety. 9)  Prilosec Otc 20 Mg Tbec (Omeprazole magnesium) .... Take 1 tablet by mouth as directed 10)  Budeprion Xl 150 Mg Tb24 (Bupropion hcl) .... 3 tablets daily 11)   Lamotrigine 100 Mg Tabs (Lamotrigine) .Marland Kitchen.. 1 tablet twice daily 12)  Hydrochlorothiazide 25 Mg Tabs (Hydrochlorothiazide) .Marland Kitchen.. 1 tab by mouth daily. 13)  Spiriva Handihaler 18 Mcg Caps (Tiotropium bromide monohydrate) .... Inhale contents of one capsule by mouth every day. 14)  Lisinopril 20 Mg Tabs (Lisinopril) .... Take 1 tablet by mouth once a day 15)  Allergy Vaccine 1:10 G.o.  .... Once a week 16)  Systane 0.4-0.3 % Soln (Polyethyl glycol-propyl glycol) .Marland Kitchen.. 1 drop each eye once daily 17)  Geri-hydrolac 12 12 % Crea (Ammonium lactate) .... Apply 3 times a day qs: for 1 month 18)  Roxicodone  15 Mg Tabs (Oxycodone hcl) .Marland Kitchen.. 1 tab q 4hrs as needed for pain 19)  Doxycycline Hyclate 100 Mg Tabs (Doxycycline hyclate) .Marland Kitchen.. 1 tab by mouth twice a day for 5 days 20)  Ambien 5 Mg Tabs (Zolpidem tartrate) .Marland Kitchen.. 1 tab by mouth at night at bedtime for insomnia 21)  Prednisone 20 Mg Tabs (Prednisone) .... 2 tabs by mouth daily for 5 days 22)  Miconazole Nitrate 2 % Crea (Miconazole nitrate) .... Apply twice a day until rash resolves  Patient Instructions: 1)  We will treat your respiratory infection with Doxy and Prednisone 2)  We will treat the skin yeast infection with Miconazole 3)  If your breathing gets worse then please return to clinic or go to the ED. Prescriptions: SINGULAIR 10 MG TABS (MONTELUKAST SODIUM) Take 1 tablet by mouth once a day  #30 x 6   Entered and Authorized by:   Angelena Sole MD   Signed by:   Angelena Sole MD on 01/23/2010   Method used:   Print then Give to Patient   RxID:   [id number] MICONAZOLE NITRATE 2 % CREA (MICONAZOLE NITRATE) Apply twice a day until rash resolves  #1 x 1   Entered and Authorized by:   Angelena Sole MD   Signed by:   Angelena Sole MD on 01/23/2010   Method used:   Print then Give to Patient   RxID:   [id number] PREDNISONE 20 MG TABS (PREDNISONE) 2 tabs by mouth daily for 5 days  #10 x 0   Entered and Authorized by:   Angelena Sole MD   Signed by:   Angelena Sole MD on 01/23/2010   Method used:   Print then Give to Patient   RxID:   [id number] DOXYCYCLINE HYCLATE 100 MG TABS (DOXYCYCLINE HYCLATE) 1 tab by mouth twice a day for 5 days  #10 x 0   Entered and Authorized by:   Angelena Sole MD   Signed by:   Angelena Sole MD on 01/23/2010   Method used:   Print then Give to Patient   RxID:   [id number]    Orders Added: 1)  [facility]- Est  Level 4 [41660]

## 2010-04-09 NOTE — Progress Notes (Signed)
Summary: triage   Phone Note Call from Patient Call back at Home Phone [phone]   Caller: Patient Summary of Call: COPD is acting up b/c of weather change - wants to know if she can get some prednisone called in  coughing up green and yellow mucus Initial call taken by: De Nurse,  January 03, 2010 1:56 PM  Follow-up for Phone Call        called patient and  [name] that due to symptoms she is having she really needs to be seen to be evaluated. she sounds short of breath over the phone. states she cannot come in because she has to get her kids from school.  she only wants prednisone called in. paged Dr. Lelon Perla and he advises patient has to be seen in order to prscribe prednisone. patient notified. advised if she can't come here today she needs to go to urgent care to be checked.  she doesn't know how she can do this. strongly encouraged her to be seen today and offer appointment but she declines.. Follow-up by: Theresia Lo RN,  January 03, 2010 2:41 PM

## 2010-04-09 NOTE — Miscellaneous (Signed)
  Clinical Lists Changes  Problems: Changed problem from ASTHMA (ICD-493.90) to ASTHMA, PERSISTENT (ICD-493.90) 

## 2010-04-09 NOTE — Progress Notes (Signed)
  Medications Added ALPRAZOLAM 1 MG TABS (ALPRAZOLAM) 1 tablet every 6- 8 hours for anxiety.       Phone Note Refill Request Call back at [phone]   Refills Requested: Medication #1:  ALPRAZOLAM 1 MG TABS 1 tab every 8 hours as needed for anxiety. Do not fill prior to 12/27/09 Need authorization from physician to fill rx .  [facility] is the place order need to go to .  [phone]  Initial call taken by: Abundio Miu,  January 15, 2010 4:23 PM    New/Updated Medications: ALPRAZOLAM 1 MG TABS (ALPRAZOLAM) 1 tablet every 6- 8 hours for anxiety. Prescriptions: ALPRAZOLAM 1 MG TABS (ALPRAZOLAM) 1 tablet every 6- 8 hours for anxiety.  #90 x 0   Entered and Authorized by:   Angelena Sole MD   Signed by:   Angelena Sole MD on 01/17/2010   Method used:   Print then Give to Patient   RxID:   [id number]
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
## 2010-04-17 NOTE — Progress Notes (Signed)
Summary: refill   Phone Note Refill Request Call back at Home Phone [phone] Message from:  Patient  Refills Requested: Medication #1:  ALPRAZOLAM 1 MG TABS 1 tablet every 6- 8 hours for anxiety. [facility] [place]  pt completely out and needs asap   Initial call taken by: De Nurse,  April 08, 2010 9:05 AM    Prescriptions: ALPRAZOLAM 1 MG TABS (ALPRAZOLAM) 1 tablet every 6- 8 hours for anxiety.  #90 x 0   Entered and Authorized by:   Angelena Sole MD   Signed by:   Angelena Sole MD on 04/08/2010   Method used:   Print then Give to Patient   RxID:   [id number]

## 2010-04-25 ENCOUNTER — Ambulatory Visit (INDEPENDENT_AMBULATORY_CARE_PROVIDER_SITE_OTHER): Payer: Medicare Other | Admitting: Family Medicine

## 2010-04-25 ENCOUNTER — Encounter: Payer: Self-pay | Admitting: Family Medicine

## 2010-04-25 DIAGNOSIS — J441 Chronic obstructive pulmonary disease with (acute) exacerbation: Secondary | ICD-10-CM

## 2010-04-25 DIAGNOSIS — E669 Obesity, unspecified: Secondary | ICD-10-CM

## 2010-04-25 DIAGNOSIS — F411 Generalized anxiety disorder: Secondary | ICD-10-CM

## 2010-04-25 MED ORDER — LORAZEPAM 0.5 MG PO TABS: 0.5000 mg | ORAL_TABLET | Freq: Three times a day (TID) | ORAL | Status: DC | PRN

## 2010-04-25 MED ORDER — PREDNISONE 20 MG PO TABS
40.0000 mg | ORAL_TABLET | Freq: Every day | ORAL | Status: DC
Start: 2010-04-25 — End: 2010-06-28

## 2010-04-25 NOTE — Assessment & Plan Note (Signed)
No acute injuries.  Negative studies per patient in ED.  Pain management per pain doctor.

## 2010-04-25 NOTE — Patient Instructions (Signed)
I have sent in a prescription for Prednisone for the shortness of breath We will try Ativan instead of Xanax Please schedule a follow up appointment in 1 month to see how the Ativan is working

## 2010-04-25 NOTE — Assessment & Plan Note (Signed)
Pt with frequent COPD exacerbations requiring hospitalization.  Have had to treat with Prednisone in order to keep her out of the hospital.
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
## 2010-04-25 NOTE — Assessment & Plan Note (Signed)
Trying to lose weight.  Has lost 75 lbs from heaviest

## 2010-04-25 NOTE — Assessment & Plan Note (Signed)
Unchanged with Xanax.  She would like to switch to Ativan to see if that is better.  Will switch and have her come back in 1 month to check on anxiety

## 2010-05-21 ENCOUNTER — Encounter: Payer: Self-pay | Admitting: Family Medicine

## 2010-05-21 ENCOUNTER — Ambulatory Visit (INDEPENDENT_AMBULATORY_CARE_PROVIDER_SITE_OTHER): Payer: Medicare Other | Admitting: Family Medicine

## 2010-05-21 DIAGNOSIS — J441 Chronic obstructive pulmonary disease with (acute) exacerbation: Secondary | ICD-10-CM

## 2010-05-21 DIAGNOSIS — F411 Generalized anxiety disorder: Secondary | ICD-10-CM

## 2010-05-21 MED ORDER — ALPRAZOLAM 1 MG PO TABS: 1.0000 mg | ORAL_TABLET | Freq: Three times a day (TID) | ORAL | Status: DC | PRN

## 2010-05-21 MED ORDER — ALBUTEROL SULFATE (2.5 MG/3ML) 0.083% IN NEBU: 2.5000 mg | INHALATION_SOLUTION | RESPIRATORY_TRACT | Status: DC | PRN

## 2010-05-21 MED ORDER — DOXYCYCLINE HYCLATE 50 MG PO CAPS: 50.0000 mg | ORAL_CAPSULE | Freq: Two times a day (BID) | ORAL | Status: AC

## 2010-05-21 MED ORDER — FLUTICASONE-SALMETEROL 500-50 MCG/DOSE IN AEPB: 1.0000 | INHALATION_SPRAY | Freq: Two times a day (BID) | RESPIRATORY_TRACT | Status: DC

## 2010-05-21 MED ORDER — OMEPRAZOLE 20 MG PO CPDR: 20.0000 mg | DELAYED_RELEASE_CAPSULE | Freq: Every day | ORAL | Status: DC

## 2010-05-21 NOTE — Patient Instructions (Signed)
I am going to treat you with an antibiotic for your fever.  It may be an early COPD exacerbation. We will switch you back to Xanax Please schedule a follow up appointment in the next couple of days to check on your breathing

## 2010-05-21 NOTE — Progress Notes (Signed)
  Subjective:    Patient ID: [name], female    DOB: April 24, 1965, 45 y.o.   MRN: [id number]  HPI 1. Fever:  She has had a fever for the past 2 days.  Fever up to 102F at home.  Here it is better.  Overall she is feeling about the same.  She hasn't been taking anything for it.  She has had a productive cough but no shortness of breath.  No other symptoms.  See ROS.  2. Anxiety:  Pt was switched from Xanax to Ativan last visit to see if that would help with her anxiety.  It hasn't helped and she would like to go back on the [place]  3. COPD:  She is back on her home O2.  Her breathing has been a little bit worse over the past couple of days.  She has been taking her medicines and inhalers as prescribed.  4. Obesity:  She continues to lose weight   Review of Systems  Constitutional: Positive for fever and fatigue. Negative for chills.  HENT: Negative for ear pain, congestion, facial swelling, neck pain and postnasal drip.   Eyes: Negative for pain and discharge.  Respiratory: Positive for cough and wheezing. Negative for choking, shortness of breath and stridor.   Cardiovascular: Negative for chest pain and leg swelling.  Gastrointestinal: Negative for abdominal distention.  Genitourinary: Negative for dysuria, flank pain, difficulty urinating and pelvic pain.  Musculoskeletal: Negative for arthralgias.  Psychiatric/Behavioral: Negative for hallucinations, behavioral problems and agitation.       Objective:   Physical Exam  Constitutional: She is oriented to person, place, and time. She appears well-developed. No distress.  Eyes: Conjunctivae are normal.  Neck: Normal range of motion. Neck supple.  Cardiovascular: Normal rate and regular rhythm.   Pulmonary/Chest: Effort normal. No respiratory distress. She has wheezes. She has no rales. She exhibits no tenderness.       Decreased breath sounds throughout.  Poor air movement.  Abdominal: Soft. Bowel sounds are normal.  Neurological:  She is alert and oriented to person, place, and time. No cranial nerve deficit. Coordination normal.  Skin: Skin is warm and dry. She is not diaphoretic.  Psychiatric: She has a normal mood and affect. Her behavior is normal.          Assessment & Plan:

## 2010-05-21 NOTE — Assessment & Plan Note (Signed)
Not improved with Ativan.  Will switch back to Xanax.

## 2010-05-21 NOTE — Assessment & Plan Note (Signed)
She is having a low grade fever and productive cough.  She has required frequent hospitalization for this.  We have done a good job keeping her out of the hospital with liberal use of Prednisone and Doxycycline.  She just finished a course of Prednisone last month.  She is not more wheezy than usual.  Will hold off on Prednisone today.  Will start a course of Doxycycline.  F/U in a couple days to recheck.

## 2010-06-17 ENCOUNTER — Other Ambulatory Visit: Payer: Self-pay | Admitting: Family Medicine

## 2010-06-17 LAB — BASIC METABOLIC PANEL
BUN: 44 mg/dL — ABNORMAL HIGH (ref 6–23)
BUN: 12 mg/dL (ref 6–23)
CO2: 21 mEq/L (ref 19–32)
CO2: 24 mEq/L (ref 19–32)
Calcium: 7.9 mg/dL — ABNORMAL LOW (ref 8.4–10.5)
Calcium: 9.7 mg/dL (ref 8.4–10.5)
Chloride: 99 mEq/L (ref 96–112)
Chloride: 97 mEq/L (ref 96–112)
Creatinine, Ser: 2.53 mg/dL — ABNORMAL HIGH (ref 0.4–1.2)
Creatinine, Ser: 0.83 mg/dL (ref 0.4–1.2)
GFR calc Af Amer: 11 mL/min — ABNORMAL LOW (ref >60)
GFR calc Af Amer: 13 mL/min — ABNORMAL LOW (ref >60)
GFR calc Af Amer: 17 mL/min — ABNORMAL LOW (ref >60)
GFR calc Af Amer: 25 mL/min — ABNORMAL LOW (ref >60)
GFR calc Af Amer: >60 mL/min (ref >60)
GFR calc non Af Amer: 11 mL/min — ABNORMAL LOW (ref >60)
GFR calc non Af Amer: 14 mL/min — ABNORMAL LOW (ref >60)
GFR calc non Af Amer: 21 mL/min — ABNORMAL LOW (ref >60)
GFR calc non Af Amer: 9 mL/min — ABNORMAL LOW (ref >60)
Glucose, Bld: 104 mg/dL — ABNORMAL HIGH (ref 70–99)
Glucose, Bld: 146 mg/dL — ABNORMAL HIGH (ref 70–99)
Glucose, Bld: 861 mg/dL (ref 70–99)
Potassium: 3.4 mEq/L — ABNORMAL LOW (ref 3.5–5.1)
Potassium: 5.1 mEq/L (ref 3.5–5.1)
Potassium: 5.1 mEq/L (ref 3.5–5.1)
Potassium: 5.1 mEq/L (ref 3.5–5.1)
Sodium: 127 mEq/L — ABNORMAL LOW (ref 135–145)
Sodium: 128 mEq/L — ABNORMAL LOW (ref 135–145)
Sodium: 129 mEq/L — ABNORMAL LOW (ref 135–145)
Sodium: 133 mEq/L — ABNORMAL LOW (ref 135–145)
Sodium: 133 mEq/L — ABNORMAL LOW (ref 135–145)

## 2010-06-17 LAB — RENAL FUNCTION PANEL
Albumin: 2.6 g/dL — ABNORMAL LOW (ref 3.5–5.2)
Albumin: 2.8 g/dL — ABNORMAL LOW (ref 3.5–5.2)
BUN: 48 mg/dL — ABNORMAL HIGH (ref 6–23)
Calcium: 7.8 mg/dL — ABNORMAL LOW (ref 8.4–10.5)
Calcium: 8.5 mg/dL (ref 8.4–10.5)
GFR calc Af Amer: >60 mL/min (ref >60)
GFR calc non Af Amer: >60 mL/min (ref >60)
Glucose, Bld: 113 mg/dL — ABNORMAL HIGH (ref 70–99)
Phosphorus: 2.7 mg/dL (ref 2.3–4.6)
Phosphorus: 5 mg/dL — ABNORMAL HIGH (ref 2.3–4.6)
Potassium: 5.2 mEq/L — ABNORMAL HIGH (ref 3.5–5.1)
Potassium: 5.6 mEq/L — ABNORMAL HIGH (ref 3.5–5.1)
Sodium: 131 mEq/L — ABNORMAL LOW (ref 135–145)
Sodium: 138 mEq/L (ref 135–145)

## 2010-06-17 LAB — CBC
HCT: 30.6 % — ABNORMAL LOW (ref 36.0–46.0)
HCT: 33 % — ABNORMAL LOW (ref 36.0–46.0)
Hemoglobin: 10.4 g/dL — ABNORMAL LOW (ref 12.0–15.0)
Hemoglobin: 9.9 g/dL — ABNORMAL LOW (ref 12.0–15.0)
MCHC: 33.7 g/dL (ref 30.0–36.0)
MCHC: 34 g/dL (ref 30.0–36.0)
MCHC: 34.3 g/dL (ref 30.0–36.0)
Platelets: 304 10*3/uL (ref 150–400)
Platelets: 307 10*3/uL (ref 150–400)
Platelets: 348 10*3/uL (ref 150–400)
RBC: 3.53 MIL/uL — ABNORMAL LOW (ref 3.87–5.11)
RBC: 3.93 MIL/uL (ref 3.87–5.11)
RDW: 14 % (ref 11.5–15.5)
RDW: 14.5 % (ref 11.5–15.5)
RDW: 14.7 % (ref 11.5–15.5)
WBC: 15.2 10*3/uL — ABNORMAL HIGH (ref 4.0–10.5)
WBC: 18.4 10*3/uL — ABNORMAL HIGH (ref 4.0–10.5)

## 2010-06-17 LAB — BLOOD GAS, ARTERIAL
Bicarbonate: 20.1 mEq/L (ref 20.0–24.0)
TCO2: 19.1 mmol/L (ref 0–100)
pCO2 arterial: 45.8 mmHg — ABNORMAL HIGH (ref 35.0–45.0)
pH, Arterial: 7.265 — ABNORMAL LOW (ref 7.350–7.400)
pO2, Arterial: 83.6 mmHg (ref 80.0–100.0)

## 2010-06-17 LAB — CK TOTAL AND CKMB (NOT AT ARMC)
CK, MB: 120.9 ng/mL — ABNORMAL HIGH (ref 0.3–4.0)
Total CK: 4178 U/L — ABNORMAL HIGH (ref 7–177)

## 2010-06-17 LAB — URINE MICROSCOPIC-ADD ON

## 2010-06-17 LAB — URINALYSIS, ROUTINE W REFLEX MICROSCOPIC
Glucose, UA: NEGATIVE mg/dL
Ketones, ur: 40 mg/dL — AB
Protein, ur: 100 mg/dL — AB

## 2010-06-17 LAB — BODY FLUID CULTURE: Culture: NO GROWTH

## 2010-06-17 LAB — URINE CULTURE: Culture: NO GROWTH

## 2010-06-17 LAB — HEMOGLOBIN AND HEMATOCRIT, BLOOD
HCT: 41.6 % (ref 36.0–46.0)
Hemoglobin: 13.7 g/dL (ref 12.0–15.0)

## 2010-06-17 LAB — SODIUM, URINE, RANDOM: Sodium, Ur: 39 mEq/L

## 2010-06-17 LAB — TROPONIN I: Troponin I: 0.02 ng/mL (ref 0.00–0.06)

## 2010-06-17 LAB — CREATININE, SERUM
Creatinine, Ser: 0.75 mg/dL (ref 0.4–1.2)
GFR calc Af Amer: >60 mL/min (ref >60)
GFR calc non Af Amer: >60 mL/min (ref >60)

## 2010-06-17 LAB — DIFFERENTIAL
Eosinophils Relative: 1 % (ref 0–5)
Lymphocytes Relative: 12 % (ref 12–46)
Lymphs Abs: 2.1 10*3/uL (ref 0.7–4.0)

## 2010-06-17 LAB — POTASSIUM: Potassium: 3.7 mEq/L (ref 3.5–5.1)

## 2010-06-17 LAB — MAGNESIUM: Magnesium: 1.9 mg/dL (ref 1.5–2.5)

## 2010-06-17 LAB — GLUCOSE, CAPILLARY: Glucose-Capillary: 112 mg/dL — ABNORMAL HIGH (ref 70–99)

## 2010-06-17 LAB — PHOSPHORUS: Phosphorus: 4.7 mg/dL — ABNORMAL HIGH (ref 2.3–4.6)

## 2010-06-17 NOTE — Telephone Encounter (Signed)
Refill request

## 2010-06-24 LAB — DIFFERENTIAL
Lymphocytes Relative: 29 % (ref 12–46)
Lymphs Abs: 2.6 10*3/uL (ref 0.7–4.0)
Monocytes Relative: 5 % (ref 3–12)
Neutrophils Relative %: 64 % (ref 43–77)

## 2010-06-24 LAB — BASIC METABOLIC PANEL
BUN: 16 mg/dL (ref 6–23)
CO2: 26 mEq/L (ref 19–32)
Chloride: 102 mEq/L (ref 96–112)
Glucose, Bld: 114 mg/dL — ABNORMAL HIGH (ref 70–99)
Potassium: 4.4 mEq/L (ref 3.5–5.1)

## 2010-06-24 LAB — URINALYSIS, ROUTINE W REFLEX MICROSCOPIC
Ketones, ur: NEGATIVE mg/dL
Nitrite: NEGATIVE
Protein, ur: NEGATIVE mg/dL
Urobilinogen, UA: 1 mg/dL (ref 0.0–1.0)

## 2010-06-24 LAB — COMPREHENSIVE METABOLIC PANEL
AST: 18 U/L (ref 0–37)
CO2: 28 mEq/L (ref 19–32)
Calcium: 9.2 mg/dL (ref 8.4–10.5)
Creatinine, Ser: 0.77 mg/dL (ref 0.4–1.2)
GFR calc Af Amer: >60 mL/min (ref >60)
GFR calc non Af Amer: >60 mL/min (ref >60)
Glucose, Bld: 91 mg/dL (ref 70–99)
Total Protein: 7.3 g/dL (ref 6.0–8.3)

## 2010-06-24 LAB — CBC
HCT: 34.5 % — ABNORMAL LOW (ref 36.0–46.0)
MCHC: 33.8 g/dL (ref 30.0–36.0)
MCV: 80.5 fL (ref 78.0–100.0)
MCV: 81.8 fL (ref 78.0–100.0)
Platelets: 303 10*3/uL (ref 150–400)
RBC: 4.62 MIL/uL (ref 3.87–5.11)
RDW: 15.2 % (ref 11.5–15.5)
RDW: 15.5 % (ref 11.5–15.5)
WBC: 14.1 10*3/uL — ABNORMAL HIGH (ref 4.0–10.5)

## 2010-06-24 LAB — VITAMIN D 25 HYDROXY (VIT D DEFICIENCY, FRACTURES): Vit D, 25-Hydroxy: 26 ng/mL — ABNORMAL LOW (ref 30–89)

## 2010-06-24 LAB — PROTIME-INR
INR: 1 (ref 0.00–1.49)
Prothrombin Time: 13.1 seconds (ref 11.6–15.2)

## 2010-06-28 ENCOUNTER — Other Ambulatory Visit: Payer: Self-pay | Admitting: Family Medicine

## 2010-06-28 NOTE — Telephone Encounter (Signed)
Refill request

## 2010-07-11 ENCOUNTER — Ambulatory Visit: Payer: Medicare Other | Admitting: Family Medicine

## 2010-07-12 ENCOUNTER — Ambulatory Visit: Payer: Medicare Other

## 2010-07-18 ENCOUNTER — Encounter: Payer: Self-pay | Admitting: Family Medicine

## 2010-07-18 ENCOUNTER — Other Ambulatory Visit: Payer: Self-pay | Admitting: Family Medicine

## 2010-07-18 NOTE — Telephone Encounter (Signed)
Refill request

## 2010-07-23 ENCOUNTER — Other Ambulatory Visit: Payer: Self-pay | Admitting: Family Medicine

## 2010-07-23 NOTE — Op Note (Signed)
[name], APGAR                  ACCOUNT NO.:  [id number]   MEDICAL RECORD NO.:  [id number]          PATIENT TYPE:  OIB   LOCATION:  0098                         FACILITY:  [facility]   PHYSICIAN:  Thornton Park. Daphine Deutscher, MD  DATE OF BIRTH:  [birth year]   DATE OF PROCEDURE:  08/24/2008  DATE OF DISCHARGE:                               OPERATIVE REPORT   PREOPERATIVE DIAGNOSES:  Thrice recurrent ventral incisional hernia and  severe chronic obstructive pulmonary disease.   PROCEDURE:  Laparoscopic takedown of incarcerated of ventral incisional  hernia and repair of multiple anterior abdominal herniae with 20 x 25 cm  Parietex mesh.   Date: 08/24/2008   SURGEON:  Thornton Park. Daphine Deutscher, MD.   ASSISTANT:  Ardeth Sportsman, MD.   ANESTHESIA:  General endotracheal.   DESCRIPTION OF PROCEDURE:  [name] was taken to room one on Thursday,  August 24, 2008, and given general anesthesia.  Preoperatively, she  received Mefoxin and heparin.  The abdomen was prepped with a Techni-  Care equivalent and draped sterilely.  Access to the abdomen was gained  through the left upper quadrant with a 0 degree, 5 mm OptiView without  difficulty.  The abdomen was insufflated and two more 5 mm were placed  in the left side.  Ultimately, a 5 was placed over on the right side and  through these I went ahead and found small intestines incarcerated up  into a hernia about the level of her umbilicus.  There was a second  hernia above that, that was not incarcerated, but it was a very large  defect with a big sac. Up on the top of that I could see mesh.  With  careful suture dissection I took down the incarceration freeing this up  without creating any enterotomies.  Once this was done, we measured the  defect and felt that it should be covered with a 20 x 25 cm piece of  mesh.  I used the Parietex that has a coating and I marked off the  longitudinal axis.  It was rolled and inserted through a 15 Ethicon  after I placed  four sutures of #1 Novofil along the axes of the mesh.  It was then unrolled and then attached superiorly and inferiorly and  then laterally and pulled up through the small incisions that we made  and using the Storz suture passer to pull it up and tie it.  We then got  under the mesh and tacked it around the perimeter using a 1.5 tackers  and then within the insides of the mesh  I placed four separate sutures  of Gore-Tex placing those and tying those down, securing the mesh with a  total of eight sutures through the fascia and multiple tacks.  It  appeared to cover the defect and keep the defect well adherent.  Also  the mesh covered my 15 mm trocar site and all of my 5mm trocar sites in  the lower abdomen.  I also placed an extra 5 in the upper midline.  When  completed, we deflated the  abdomen and closed wounds with 4-0 Vicryl and  with Dermabond.  The patient seemed to tolerate the procedure well and  was taken to the recovery room in satisfactory condition.      Thornton Park Daphine Deutscher, MD  Electronically Signed     MBM/MEDQ  D:  08/24/2008  T:  08/24/2008  Job:  [id number]   cc:   [facility]   Ruthe Mannan, M.D.

## 2010-07-23 NOTE — Consult Note (Signed)
[name], [name]                  ACCOUNT NO.:  [id number]   MEDICAL RECORD NO.:  [id number]          PATIENT TYPE:  INP   LOCATION:  1234                         FACILITY:  [facility]   PHYSICIAN:  Maree Krabbe, M.D.DATE OF BIRTH:  09-17-1965   DATE OF CONSULTATION:  08/26/2008  DATE OF DISCHARGE:                                 CONSULTATION   REASON FOR CONSULTATION:  Acute renal failure.   HISTORY:  This is a 45 year old white female with a history of severe  COPD on home oxygen, hypertension, former heavy smoker, obese, with  previous abdominal surgeries including an abdominal hernia repair in  2007.  The patient had developed an incarcerated incisional ventral  hernia.  She was admitted electively on June 17 after undergoing  surgical repair with laparoscopic takedown of the ventral incarcerated  hernia and repair with mesh.  The preoperative creatinine done 2 days  earlier on June 15 was 0.8.  The second postoperative day, on June 19,  the labs were checked and her creatinine was up to 4.5.  Of note, the  patient's urine output had reportedly dropped off as early as the first  24 hours postop.  Bladder scan showed no urine in the bladder, and urine  output was about 10 cc an hour.  The patient did develop a small fluid  collection in the superficial abdominal wall, and this was aspirated by  the surgeons and gram stain was negative for any organisms.  CPK was  checked.  It was 2780.  Urine output continues to be marginal in spite  of aggressive IV fluid adminstration.   PHYSICAL EXAMINATION:  GENERAL:  The patient is awake, alert, oriented.  She responds appropriately.  She has kind of a wet cough.  She is not  in any respiratory difficulty or distress.  VITAL SIGNS:  Blood pressure by the automated cuff is 92/50.  Manual  blood pressure in the right upper arm with a large cuff was 102/45.  However, the patient did have a quite large pulsus paradoxus of 40-50  mm.  SKIN:   Without cyanosis.  There is 2+ pitting edema throughout the lower  legs and thighs bilaterally and symmetrically.  NECK:  Consistent with JVD.  CHEST:  The chest has  mostly cleared with a couple scattered  inspiratory crackles at the lung bases.  HEART:  Tachycardic.  Difficult to auscultate.  Normal S1-S2.  ABDOMEN:  Obese, nontender.  There are multiple stab wounds from the  laparoscopic procedure.  EXTREMITIES:  2+ edema is noted above bilateral legs and thighs.  NEUROLOGIC:  Alert and oriented x3.  No focal deficits.  No asterixis.   LABORATORY DATA:  Sodium 133, potassium 3.6, CO2 of 16, BUN 33,  creatinine 3.5.  Hemoglobin 10, white blood count 16,000, platelets  300,000.  Calcium 6.2.  CPK 2782.  Chest x-ray from today - stable mild  bibasilar atelectasis.  Urinalysis from today showed moderate bilirubin,  large blood, negative nitrate, large leukocyte esterase.  Microscopic  showed 20-50 white blood cells, too numerous to count red blood cells.  Abdominal fluid aspirate from the ventral hernia repair wound showed  white blood cells present, predominately PMNs, no organisms.  BNP was  less than 30.  Troponin 0.02, glucose was 861.  I suspect this was an  error in the way that the blood was drawn.   IMPRESSION:  1. Acute renal failure, oliguric in the acute postoperative setting in      a patient who was morbidly obese with chronic severe chronic      obstructive pulmonary disease who was on an ACE inhibitor and a      diuretic preoperatively and perioperatively.  Although the patient      was not distinctly hypotensive, the most likely explanation is      hypovolemic hypoperfusion.  Rhabdomyolysis could be implicated, but      the CPK levels are not dramatically high.  The patient certainly is      at risk for having the physiology of right heart failure with her      chronic obstructive pulmonary disease, and there may be a component      of right ventricular dysfunction  contributing to her current      hypotension and pulsus paradoxus.  A CVP was checked tonight and is      28, and the patient has been getting a lot of fluid in the last 24      hours.  She is beginning to look clinically possibly volume      overloaded, so we will discontinue the fluids for now and follow      CVP.  In addition, would recommend stopping morphine, and also      would consider addition of IV steroids, since the patient was      recently on a oral steroid taper within the past few weeks.  This      may help her hypotension.  Would stop her fluids, check a blood      gas.  The patient also may have a urinary tract infection with      leukocytosis and pyuria, and white blood count is up to 18,000.      Recommend IV antibiotics.  Will discuss with primary team.  The      patient does not require acute dialytic intervention currently, but      she may progress in the next 48 hours.  She is at high risk for      respiratory failure with her obesity and chronic obstructive      pulmonary disease and will need to watch her fluid volume status      closely also.  The ACE inhibitor and HCTZ have already been stopped      by the pulmonary doctors.  2. Severe chronic obstructive pulmonary disease.  3. History of hypertension on ACE and a diuretic at home.  4. Status post laparoscopic incisional ventral hernia repair on August 24, 2008.   RECOMMENDATIONS:  See above and orders.      Maree Krabbe, M.D.  Electronically Signed     RDS/MEDQ  D:  08/26/2008  T:  08/27/2008  Job:  [id number]

## 2010-07-23 NOTE — Telephone Encounter (Signed)
Refill request

## 2010-07-23 NOTE — Assessment & Plan Note (Signed)
[facility]                             PULMONARY OFFICE NOTE   NAME:BEALGraceyn, Fodor                         MRN:          [id number]  DATE:08/25/2006                            DOB:          [birth year]    PROBLEM LIST:  1. Asthma, chronic obstructive pulmonary disease.  2. Allergic rhinitis.  3. Anxiety and depression.  4. Esophageal reflux.  5. Obesity.  6. Tobacco abuse.   HISTORY:  Last here in 2006.  She still smokes, blaming stress.  She is  followed now medically by the [facility].  She had a  difficult hospital stay with repair of an abdominal wall hernia  complicated by pseudomonas.  This is only now completely closing.  She  failed a trial of Chantix.  Notices some wheeze and nasal congestion  blamed on allergies.  Uses her nebulizing machine every morning.  She  has continued allergy vaccine at 1:10, giving her own injections without  problems but did not get a vaccine refill that she requested in May, so  she has been out for about 3 weeks.   MEDICATIONS:  1. Advair 500/50.  2. Singulair 10 mg.  3. Flonase.  4. Loratadine.  5. Prilosec.  6. Zoloft 200 mg.  7. Xanax.  8. Furosemide 20 mg x2.  9. Wellbutrin 300 mg.  10.Rescue albuterol.  11.Home nebulizer machine with albuterol.  12.She has an Epipen.   OBJECTIVE:  VITAL SIGNS:  Weight 256 pounds.  Blood pressure 122/82,  pulse regular at 79.  Room air saturation 99%.  LUNGS:  There is mild nasal congestion and a congested cough with mild  bibasilar rhonchi. No dullness or rub.  Work of breathing is not  increased.  CARDIAC:  Heart sounds are regular and normal.  SKIN:  I do not find adenopathy or peripheral edema.   IMPRESSION:  1. Rhinitis.  2. Bronchitis.  3. Tobacco abuse.  4. Allergic component.   PLAN:  1. We will resend allergy vaccine, and I have discussed how to rebuild      that.  2. We are scheduling return in one year since she is followed  by the      [facility], but earlier p.r.n.  3. Smoking cessation was heavily reinforced.     Clinton D. Maple Hudson, MD, Tonny Bollman, FACP  Electronically Signed    CDY/MedQ  DD: 08/25/2006  DT: 08/26/2006  Job #: [id number]

## 2010-07-23 NOTE — Discharge Summary (Signed)
NAMEBRITTAIN, SMITHEY                  ACCOUNT NO.:  [id number]   MEDICAL RECORD NO.:  [id number]          PATIENT TYPE:  INP   LOCATION:  2040                         FACILITY:  [facility]   PHYSICIAN:  Nestor Ramp, MD        DATE OF BIRTH:  Jul 17, 1965   DATE OF ADMISSION:  08/04/2007  DATE OF DISCHARGE:  08/07/2007                               DISCHARGE SUMMARY   PRIMARY CARE PHYSICIAN:  Ruthe Mannan, MD at [facility].   DISCHARGE DIAGNOSES:  1. Pneumonia.  2. Chronic obstructive pulmonary disease exacerbation.  3. History of asthma.  4. History of hypertension.  5. History of depression and anxiety.  6. History of obesity.  7. History of bipolar disorder.   DISCHARGE MEDICATIONS:  1. Flonase 2 sprays each nostril daily.  2. Travatan eye drops per home regimen.  3. Singulair 10 mg p.o. daily.  4. Claritin 10 mg p.o. daily.  5. Advair 500/50 1 puff twice a day.  6. Xanax 1 mg.  7. Lasix 20 mg p.o. b.i.d.  8. Wellbutrin 450 mg p.o. daily.  9. Prednisone 60 mg p.o. daily x8 more days.  10.Avelox 400 mg p.o. daily x3 more days.  11.Celexa 40 mg p.o. daily.  12.Lamictal 100 mg p.o. b.i.d.  13.Oxygen while walking.  14.Tussionex 5 mL p.o. b.i.d. p.r.n. for cough.   CONSULTS:  None.   PROCEDURES:  None.   LABS:  Upon admission, the patient was noted to have white blood cell  count of 14.1, 85% neutrophils, hemoglobin 11.9, and ABG that showed a  pH of 7.43, CO2 of 38.2, and O2 of 66.  CMET was all within normal  limits.  A chest x-ray showed a 2-cm density in the lingula not present  on prior study, pneumonia versus a mass.  During her hospital stay, she  had an EKG that was normal.  On the day of discharge, her white blood  cell count was 18.8 with 80% neutrophils.   BRIEF HOSPITAL COURSE:  This is a 45 year old female with history of  asthma and COPD and tobacco abuse that was admitted for severe dyspnea,  noted to be tachypneic and having respiratory retractions  on exam.  Her  initial admission, she was placed into the stepdown unit for close  observation.  ABG was drawn as stated above.  Chest x-ray indicated that  she had a pneumonia located in the lingula.  She was treated  appropriately, initially placed on IV Avelox, and transitioned over to  p.o. once she was moved out to stepdown unit.  She was placed on  Atrovent and albuterol nebs q. 2 p.r.n.  The patient was also started on  Solu-Medrol IV for 1 day and transitioned to high-dose prednisone 60 mg  for a total of 10-day burst.  The patient did require some O2 during  hospital stay.  She did sat above 92% on day of discharge while sitting.  When she did ambulate, it was noted that her O2 dropped down to 87%.  Therefore, she was discharged with home O2 per  ambulation.  She was also  discharged with antibiotics to fulfill course of 7 days.  It is  recommended that she had PFTs done as an outpatient to differentiate  between COPD and asthma, as this was difficult to evaluate during this  hospital stay during this acute exacerbation.  The patient also received  some smoking cessation counseling while she was here.   Hypertension.  It was stable throughout her hospital stay.   Depression and anxiety.  She was continued on her home regimen and did  not have any issues.   FOLLOWUP APPOINTMENTS:  She is scheduled for followup with Dr. Pearletha Forge  at [facility] at August 25, 2007, at 11:30 a.m.  She is  discharged in stable condition.      Marisue Ivan, MD  Electronically Signed      Nestor Ramp, MD  Electronically Signed    KL/MEDQ  D:  08/07/2007  T:  08/07/2007  Job:  [id number]   cc:   Ruthe Mannan, M.D.

## 2010-07-23 NOTE — Discharge Summary (Signed)
[name], [name]                  ACCOUNT NO.:  [id number]   MEDICAL RECORD NO.:  [id number]          PATIENT TYPE:  INP   LOCATION:  1317                         FACILITY:  [facility]   PHYSICIAN:  Ardeth Sportsman, MD     DATE OF BIRTH:  08-01-1965   DATE OF ADMISSION:  08/24/2008  DATE OF DISCHARGE:  08/30/2008                               DISCHARGE SUMMARY   PRIMARY CARE PHYSICIAN:  Ruthe Mannan, M.D. at [facility].   PULMONOLOGIST:  Rennis Chris. Young, M.D. with [facility],  Pulmonary.   NEPHROLOGIST:  Maree Krabbe, M.D.   SURGEON:  Thornton Park. Daphine Deutscher, M.D.   FINAL DISCHARGE DIAGNOSES:  1. Recurrent ventral incisional hernia.  2. Acute renal failure, resolved.   OTHER FINAL DISCHARGE DIAGNOSES:  1. Severe chronic obstructive pulmonary disease, oxygen requiring.  2. Hypertension on ACE inhibitor and diuretic at home.  3. Chronic pain.  4. Depression.  5. Morbid obesity.  6. Tobacco abuse in the past, quit.  7. Allergic rhinitis.  8. Prior pneumonia in the past.  9. Severe anxiety.  10.Enterococcus urinary tract infection (pansensitive).  11.Gastroesophageal reflux disease.   PRINCIPAL PROCEDURE:  Laparoscopic lysis of adhesions with incarcerated  ventral hernia with repair on August 24, 2008.   HOSPITAL COURSE:  [name] is a 45 year old morbidly obese female who  has severe COPD and is oxygen requiring.  She has had an incisional  hernia that had been repaired in an open fashion in the past with  evidence of recurrence.  Dr. Daphine Deutscher had consulted and after pulmonary  clearance she underwent laparoscopic repair on August 25, 2008.  She had  decreasing urinary output to the point of oliguria. She had gross fluid  resuscitation and pulmonary critical care as well as nephrology  consultations were made.  After holding her ACE inhibitor and more  aggressive fluid resuscitation her creatinine, which had risen as high  as 5.28, gradually improved back to a  more normal range.  Her urine  output improved as well.  She did have an ileus but around postoperative  day #4 began to have flatus and was advanced on her diet.  By the time  of discharge she was tolerating a solid diet relatively well.  She was  transitioned over from parenteral to oral pain medications and at the  time of discharge had adequate enough pain control to be walking in the  hallways on oral pain control.  Her white count did get as high as 16.  She had a culture evaluation which showed evidence of a postoperative  seroma that was sterile.  She did have an Enterococcus urinary tract  infection. She was on IV Zosyn for 24 hours and was transitioned over to  Levaquin based on sensitivities. Based on her improvement, it was felt  it would be reasonable for her to be discharged home with the following  instructions.   DISCHARGE INSTRUCTIONS:  1. She is to return to see Dr. Daphine Deutscher in about 1 to 2 weeks.  2. She should hold her lisinopril, ACE  inhibitor with her recent      episode of renal failure.  3. She can follow up with her primary care physician, Dr. Dayton Martes in the      next few weeks.  4. It is probably reasonable to continue her hydrochlorothiazide      guardedly.  5. She should call if she has any fevers, chills, sweats, nausea,      vomiting, worsening abdominal pain, worsening drainage or redness      of her incisions or other concerns.  6. She should complete her antibiotic course with Levaquin 500 mg p.o.      daily for 3 more days for her urinary tract infection.  7. She should use oxycodone 5 to 15 mg p.o. q.4h. p.r.n. pain.  8. She can use Tylenol p.r.n. pain.   1. She should resume her home medications which include:      a.     Advair 500/50, one puff twice a day.      b.     Singulair 10 mg q.a.m.      c.     Loratadine 10 mg q.a.m.      d.     Hydrochlorothiazide 25 mg q.a.m.      e.     Omeprazole 20 mg q.a.m.      f.     Alprazolam 1 mg q.4h. p.r.n.  anxiety.      g.     Spiriva caps 30 inhaler q.a.m.      h.     Albuterol 1 to 2 puffs q.4h. p.r.n. versus inhaler.      i.     Fluticasone 50 mg b.i.d.      j.     Mupirocin ointment apply underneath breasts and skin folds       p.r.n. redness and rash.      k.     Vitamin D 5000 international units q.a.m.      l.     Allergy vaccine weekly.      m.     Urea cream to feet b.i.d., 40%.      n.     Locoid 0.1 mg b.i.d. under breasts.      o.     Cyclobenzaprine 10 mg b.i.d.      p.     Bupropion 450 mg q.a.m.      q.     Lamotrigine 100 mg b.i.d.      r.     Citalopram 80 mg q.a.m.      s.     Oxygen 2L at home.      t.     Systane 1 drop each eye q.a.m.      u.     Travatan solution, 0.004 both eyes at night.      Ardeth Sportsman, MD  Electronically Signed     SCG/MEDQ  D:  08/30/2008  T:  08/30/2008  Job:  [id number]   cc:   Joni Fears D. Maple Hudson, MD, FCCP, FACP  [facility]  [address]  [place]  [place] [address]   Ruthe Mannan, M.D.   Maree Krabbe, M.D.  Fax: [phone]

## 2010-07-23 NOTE — Op Note (Signed)
[name], RINKE                  ACCOUNT NO.:  [id number]   MEDICAL RECORD NO.:  [id number]          PATIENT TYPE:  INP   LOCATION:  5029                         FACILITY:  [facility]   PHYSICIAN:  Nelda Severe, MD      DATE OF BIRTH:  28-Nov-1965   DATE OF PROCEDURE:  03/21/2008  DATE OF DISCHARGE:                               OPERATIVE REPORT   SURGEON:  Nelda Severe, MD   ASSISTANT:  Lianne Cure, PA-C   PREOPERATIVE DIAGNOSIS:  C5-6 disk herniation/spondylosis, spinal  stenosis with myelopathy, status post C6-7 fusion (remote).   POSTOPERATIVE DIAGNOSIS:  C5-6 disk herniation/spondylosis, spinal  stenosis with myelopathy, status post C6-7 fusion (remote).   OPERATIVE PROCEDURE:  Anterior decompression at C5-6 and fusion using  interbody cage with Bioactive Vitoss and anterior titanium plate and  screws.   The patient was placed under general endotracheal anesthesia.  Foley  catheter was not inserted.  She was given prophylactic intravenous  antibiotics.  Sequential compression devices were placed on both lower  extremities.  She was positioned on a Jackson flat top table in the  supine position with a bump under her shoulder blades and her head/neck  extended and supported on foam donut.  Her arms were tucked at the sides  and the wrapping of the arm secured with broad adhesive tape.  We  painted the skin on the superior aspect of both shoulders with tincture  of Benzoin and used 4-inch adhesive tape to depress her shoulders,  anchored to the distal portion of the table.  A mark was made in one of  the skin creases on the neck of the side and a spinal needle taped in  place.  A crosstable lateral radiograph was taken, which revealed that  the level of the skin marking was at C4-5 and we could see the C4-5  disk, but everything below that was poorly visualized secondary to this  patient's morbid obesity and difficulty getting a good crosstable  lateral of the cervical  spine, something which was anticipated  preoperatively.   The cervical area was then prepped with DuraPrep and draped in  rectangular fashion.  The drapes were secured with Ioban.   A time-out was then held, at which point, the patient was identified  along with the preoperative diagnosis, the intended procedure, the  anticipated time of blood loss, etc. etc.   We then made a slightly oblique skin incision from midline to the left,  anterior border of sternocleidomastoid, about 1 cm below the previously  made skin marking.  After incision of the epidermis, the subcutaneous  tissue was injected with 0.5% Marcaine with epinephrine.  The incision  was then deepened and the platysma cut in line with the incision.  One  small jugular tributary was bipolar coagulated and divided.  We then  bluntly dissected the deep cervical fascia using the anterior border of  sternocleidomastoid as our landmark.  The omohyoid muscle was identified  and retracted medially.  A blunt dissection was carried down onto the  anterior spinal column and the longus coli muscle  was identified.  We  then retracted the trachea and esophagus to the right side.  We  identified what was in all likelihood the C5-6 level, but because of a  bridging, but incomplete anterior spondylophyte, we could not place a  marker there.  Therefore, a marker was placed at C4-5 and a crosstable  lateral taken which revealed that the marker was at the C4-5 level, and  anticipated level of operation, C5-6 was the immediately subjacent  level.   We then mobilized the longus coli muscle bilaterally using cutting  current.  The shadow line retractor was then placed.  We then placed  Caspar distraction pins in the body of C5, in the body of C6, and  distracted the disk space a little, always possible because of the  fairly severe spondylitic changes at this level.  We then incised the  disk anteriorly and enucleated the disk using combination  of rongeurs,  and curettes, etc.  I then used a high-speed bur to bur away some of the  posterior endplates/spondylophyte on the C6 vertebra, back to the  posterior longitudinal ligament.  This bur on the C5 vertebra do not  seem to be as big.  As I used an nerve hook to probe the outer layers of  the disk at their junction with the posterior longitudinal ligament, a  moderate quantity of extruded nucleus pulposus was identified and  delivered into the wound.  Ultimately, I had cleared all the annulus  away from the width of the disk space and there did not appear to be any  rent in the posterior longitudinal ligament.  I then performed  foraminotomies bilaterally.  Ultimately, I had lysed the posterior  longitudinal ligament and was able to directly inspect the dura from  right to left.  There were no evidence of any extradural fragments which  had escaped through the posterior longitudinal ligament.  At this point  having probed the neural foramina bilaterally and the central canal and  visualized the dura, I felt that decompression was complete.  We used  the curettes and high-speed bur to remove any remaining endplate  cartilage above and below.  We sized the disk space to take a 7-mm thick  interbody cage.  We used Bioactive Vitoss soaked with blood as the graft  material in the cage.  The cage was then inserted, countersunk very  slightly and the distraction let off the Caspar pins, so that the  endplates gripped the cage.  The distraction device pins were then  removed.   Next, we placed a one-level titanium plate with four screws.  Crosstable  lateral radiograph showed satisfactory position of the plate and screws  and of the interbody cage.   I packed a little more Vitoss BA anteriorly under the plate on right and  left sides.  The wound was then irrigated and a 7-gauge Silastic drain  left in the prevertebral area and brought out through the skin to the  right side of the  incision.  This was secured with a 3-0 nylon in basket  weave fashion.  The platysma was closed using inverted 0 Vicryl sutures.  The subcutaneous tissue was closed using inverted 2-0 Vicryl sutures and  the skin was closed using a subcuticular 3-0 undyed Vicryl in running  fashion.  The wound was dressed with an antibiotic ointment dressing and  the dressing secured with a surgical towel around the neck.   The blood loss estimated less than 50 mL.  There was no intraoperative  complication.  At the time of dictation, the patient has been  transferred to the recovery room and has not yet been examined.  Sponge  and needle counts were correct.      Nelda Severe, MD  Electronically Signed     MT/MEDQ  D:  03/21/2008  T:  03/21/2008  Job:  [id number]

## 2010-07-23 NOTE — H&P (Signed)
[name], [name]                  ACCOUNT NO.:  [id number]   MEDICAL RECORD NO.:  [id number]          PATIENT TYPE:  INP   LOCATION:  3307                         FACILITY:  [facility]   PHYSICIAN:  Leighton Roach McDiarmid, M.D.DATE OF BIRTH:  01/07/1966   DATE OF ADMISSION:  08/04/2007  DATE OF DISCHARGE:                              HISTORY & PHYSICAL   CHIEF COMPLAINT:  Shortness of breath.   HISTORY OF PRESENT ILLNESS:  This is a 45 year old white female with a  history of both asthma and COPD who presents with severe dyspnea that  has progressed over the last week.  She is to the point where she is  using her nebulizer almost back-to-back with little to no improvement.  She has also had some subjective fevers and similar URTI symptoms.  She  is a previous smoker and has quit in the last few months.  She has been  admitted for dyspnea and COPD exacerbation before.  She is currently  having retractions and tachypnea.  She was unable to walk to the exam  room because of the severe dyspnea today.   REVIEW OF SYSTEMS:  Otherwise negative.   PAST MEDICAL HISTORY:  Asthma, COPD, anxiety, obesity, depression,  hypertension, tobacco, and allergic rhinitis.   PAST SURGICAL HISTORY:  Abdominal hernia in 2007, appendectomy in 1993,  tubal ligation in 2002, C-section in 2002, and another previous C-  section, the patient do not know the date of, carpal tunnel release in  2003, neck surgery in 2004, and right knee surgery in 1995.   FAMILY HISTORY:  No medical problems with brother and father.  Mother  has diabetes and colitis.  Sister had no medical problems and 2 sons  with ADHD.  One 60 year old son had suicide attempt in 2008.   SOCIAL HISTORY:  The patient is married, with 2 sons, lives at home, and  disabled secondary to asthma.  She has quit drinking and had problems  with DWIs in the past.  She does not smoking anymore.   PHYSICAL EXAMINATION:  General:  Obese, tachypneic, subjective  air  hunger.  Head:  No obvious external abnormalities at the ears, nose, and  lips.  Mouth shows normal-appearing oropharynx.  Neck shows no  deformities, masses, or tenderness.  Chest shows no deformities, masses,  or tenderness.  Lungs shows bilateral wheezing but restricted air  movement.  Also tachypneic and has retractions.  Heart is tachycardiac,  no obvious murmurs, appears regular.  Abdomen.  The patient has a large  surgical scar, has well-healed.  She is obese, is nontender to  palpation.  Extremities:  No edema/cyanosis.  Skin:  Intact without  suspicious lesions or rash.  Insight, cognition, and judgment appears  intact.  The patient is alert and cooperative.  Her vital signs:  Sat  98% on room air, pulse 110, respirations 22, temperature 99.1, and blood  pressure 149/91.   ASSESSMENT AND PLAN:  1. Asthma:  I think this is mainly an asthma exacerbation and less      COPD because it does not seem like secretions  are a big issue at      this time. We will treat both with Avelox 400 mg and IM Solu-Medrol      125 mg given now and continue this every 4-6 hours for the next 24      hours with IV Solu-Medrol.  She is to continue nebulizers every 4      hours to every 2 hours as needed.  We will get a chest x-ray.  We      will continue her Advair and consider starting Spiriva when she      leaves the hospital.  Right now, her vital signs are stable, but I      am worried about her tiring, so we will admit to the step-down      unit.  2. Anxiety.  We will continue with Xanax for the anxiety, which may be      at least a partial component of her air hunger.  3. Chronic medical issues, no changes in these at this time.      Angeline Slim, M.D.  Electronically Signed      Leighton Roach McDiarmid, M.D.  Electronically Signed    AL/MEDQ  D:  08/04/2007  T:  08/05/2007  Job:  [id number]

## 2010-07-26 NOTE — Assessment & Plan Note (Signed)
MEDICAL RECORD NUMBER:  [id number]   INTERVAL HISTORY:  [name] is a 46 year old married white female who is a  patient of Dr. Emilia Beck.   She is back in today for a recheck and refill of her medications.   She is being seen in our clinic predominantly for cervicalgia and thoracic  back pain.   At her last visit she was encouraged to follow up with physical therapy for  a TENS unit trial.   She reports overall her pain is improved, averages a 5 on a scale of 10,  pain is predominantly located between the shoulder blades described as a  sharp feeling.  Her functional status is overall improved.  She is able to  clean her house again.  She is able to walk about a 15 minutes at a time.  She is pacing her activities much better.   She reports that she finds the TENS unit quite helpful.  She is also using  other modalities such as heat and ice to help with her flareups.  She  reports physical therapy has helped her out a lot.   Pain is made worse typically with walking, bending, working, improves with  rest, heat, ice, therapy and a TENS unit.   She had recently been started on Topamax.  She reports that this seems to  help as well.   Changes in her past medical history are remarkable for some uterine  bleeding, she is being followed by her gynecologist for this, she is  undergoing workup for endometriosis,  possibly considering hysterectomy.   No new changes otherwise in social history other than she is no longer  living with the 63 year old cousin.  She is currently not working.  No  changes in family history.   EXAMINATION:  [name] is an obese white female who does not appear in any  distress this morning.  Her blood pressure is 131/77, pulse 84, respirations  20, 97% saturated on room air.  She is alert, bright, cooperative.  She is  able to stand independently from a seated position.  Her gait is normal.  She has no pain behaviors during range of motion testing of her  lumbar and  cervical area.  She does however, have limitations mildly in all planes in  her lumbar mobility and she has mild limitations in cervical range of motion  especially with extension.  Seated her reflexes are symmetric and intact in  upper and lower extremities.  Her motor strength is 5/5 in the lower  extremities at hip flexors, knee extensors, dorsiflexors, plantar flexors,  EHL.  She has normal sensation to light touch in the lower extremities.  Gait is normal.   IMPRESSION:  1.  Cervical stenosis at C5-6.  2.  Status post cervical fusion with anterior plating at C6-7.  3.  History of cord flattening at C5-6 with spinal stenosis.  4.  History of nicotine addiction.  5.  Recent onset uterine bleeding being followed by gynecologist.  6.  Anxiety/depression.   PLAN:  We will refill the following medications for her today:  Topamax 75  mg one p.o. b.i.d. (#60); amitriptyline 10 mg one p.o. at bedtime (#30);  Effexor XR 75 one p.o. q.a.m. (#30); she has 52 Ultracet left, she is only  taking  about 2-3 a day, does not feel she needs more than that, we will not refill  that one today, if she does call in when she runs out we will give her  Ultracet one p.o. t.i.d. (#90).  We will see her back then in a month.      DMK/MedQ  D:  04/24/2004 11:56:09  T:  04/24/2004 13:07:55  Job #:  [id number]

## 2010-07-26 NOTE — Assessment & Plan Note (Signed)
INTERVAL HISTORY:  [name] is a 45 year old married white female who is  being seen in our pain and rehabilitative clinic predominantly for  cervicalgia/bilateral upper extremity pain.   She is status post history of C5-6 stenosis and has a history of cervical  fusion with anterior plating at C6-7.  Known to have cord flattening at C5-6  with spinal stenosis.   She is back in today for a recheck and recently had her medications filled  call in through the pharmacy.  Her amitriptyline was refilled at 10 mg at  bedtime.  These were filled on February 18, 2005.  Her Topamax was also  refilled 25 mg twice daily, Effexor XR 75 one p.o. every morning and  Ultracet one to two p.o. q.8 h (#180).   She has been doing well on these medications.   She does report she had a flareup of her neck and arm pain approximately 10  days ago, denies any kind of trauma, basically woke up with it.  She cannot  really think of anything she did to exacerbate her pain.  She reports her  average pain is about a 6 on a scale of 10, today her pain is about a 7  described as constant, tingling, aching, stabbing, sharp.  Sleep has been  poor.  She gets fair relief with the current meds she is on.  She can walk  about 5-10 minutes at a time, climbs stairs, able to drive.  She is  independent with feeding, bathing, requires some assistance with dressing,  toileting, meal prep, household duties, shopping.  Her husband helps her out  sometimes although he has been ill as well, has had some kidney problems.   Denies bowel or bladder control problems, denies suicidal ideation, does  admit to depression, anxiety, numbness, tingling, weakness.  Also review of  systems positive for weight gain, respiratory infections, limb swelling,  coughing, shortness of breath, breathing problems and wheezing.  I asked her  to follow up with PCP for these problems.  Reports no new changes in past  medical history, social history or  family history since last visit.  She  does report she has decreased her smoking.  She is down to less than a pack  a day.  She had been smoking almost double that a couple of months ago.   EXAMINATION TODAY:  She is an obese white female who does not appear in any  distress.  She is oriented x3.  Her affect is bright, alert, cooperative and  pleasant.  Blood pressure is 106/66, pulse 59, respirations 16, 99%  saturated on room air.  She stands up and displays a lot of pain behavior,  difficulty with rotation of the head in either direction, limited in  extension as well.  Left upper extremity decreased range of motion limited  to about 90 degrees of abduction on the left, full abduction on the right is  noted.  Romberg's test is negative.  Balance is good.  Tandem gait is  adequate.  Seated reflexes are symmetric and intact in the upper and lower  extremities.  No abnormal tone is noted.  Her motor strength is 5/5 in the  upper extremities as well however this is tested isometrically.   IMPRESSION:  1.  History of cervical stenosis C5-6.  2.  Status post fusion with anterior plating C6-7.  3.  Cord flattening at C5-6 with spinal stenosis.  4.  Nicotine addiction.  5.  History of positive  drug screen for marijuana.  6.  Asthma.  7.  Hypertension.  8.  History of anxiety/depression.   PLAN:  Since the patient's meds were recently refilled including Ultram,  amitriptyline, Topamax, Effexor, Ultracet, I will not need to do that today.  We will give her a trial of Celebrex 200 mg one p.o. daily for 10-12 days.  She will discontinue it if she has any stomach problems, also cautioned  about blood pressure elevation with this medication.  We will also give her a prescription for a cervical collar which she can  wear 3-4 hours a day while she is having a flareup.  We will having nursing  follow her up in 1 month and I will see her back in 6-8 weeks.            ______________________________  Brantley Stage, M.D.     DMK/MedQ  D:  02/24/2005 12:58:34  T:  02/25/2005 18:29:31  Job #:  [id number]

## 2010-07-26 NOTE — Group Therapy Note (Signed)
HISTORY:  [name] is a 45 year old married white female referred by Dr.  Emmit Alexanders for evaluation for neck pain.   [name] has a several-year history of neck pain.  She states it became  gradually worse over the years.  She subsequently underwent an anterior  plating at C6-7 by Dr. Elvera Maria approximately 2 years ago.   There are no operative notes regarding this particular surgery.  I am  working from the patient's history and an MRI which was dated July 07, 2003.   She recently has been having increased pain in her neck over the last 6  months.  She subsequently saw a neurosurgeon in [place], again I have no  notes regarding this particular visit, apparently no interventions were  recommended at that particular visit.  She does have another opinion set up  with a neurosurgeon in [facility] set up for December 05, 2003.   She is in our pain clinic for evaluation for her cervical and thoracic back  pain.  As I have stated earlier it has been worse over the last several  months.  She has a history of persistent left index finger numbness,  occasionally becoming entire hand numbness but this is not as persistent as  the index finger numbness.  She also gets occasional bilateral foot  tingling.  Her pain in her back her cervical area and her thoracic area she  reports as fairly constant she describes as an 8 on a scale of 10, there is  really no variation in the intensity in the pain, it stays fairly constant  at an 8, it seems to be worsened for her by standing any length of time, any  kind of activity is difficult for her to perform much longer than about 10  minutes.  Regarding her bowel and bladder function she reports that she has  control, she does have some problems with coughing, she may have some  leakage of urine.  She has not had any new problems with her gait.  Denies  any new problems in her arms or legs as far as weakness is concerned.   She is independent with her  activities of daily living including feeding,  dressing, bathing, orofacial hygiene and meal prep.  She is independent with  some domestic tasks including dishes, cleaning and laundry, however, she  does break her tasks up into about 10 minutes at a time.  She is able to  walk about 10 minutes maximum.  She is independent with driving.   She reports her sleep is poor.   She denies any harm to self or others.   SOCIAL HISTORY:  Patient is married x1 year.  She has two children a 31 year  old and a 61-1/2 year old.  She works in the home.  She smokes about a pack  of cigarettes a day.  She has a 26 pack-year history.  Reports occasional  alcohol use however, has been ticketed for driving under the influence in  the mid 1990s.   She denies any illicit drug use.  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   FAMILY HISTORY:  Family history is remarkable for mother who is alive age 48  has diabetes, has a brother who is manic depressive and a sister who she  reports is hypochondriac.  She has no history regarding her father.   ALLERGIES:  Allergies are to just environmental; no drug  allergies are  reported by the patient.   MEDICATIONS:  Medications at this time include Advair 500/50, Singulair 10  mg, Flonase 50 mg, Xanax 0.5, Prilosec 20 mg, Zoloft 100 mg, loratadine 10  mg, albuterol inhaler and allergy shot twice a week.   PHYSICAL EXAMINATION:  On exam she is alert, cooperative, affect is overall  upbeat interspersed with some periods of depression.  She is a well-  developed obese female in no apparent distress during our interview.  She  has a blood pressure of 125/75, pulse 75, respirations 16, 97% saturated on  room air.  She was able to get off of the exam table, she was somewhat slow  doing this, her gait was slow however, appeared quite stable, she had short  stride length, no foot drop or ataxia was noted, the gait was not  particularly wide  based or antalgic.   Deep tendon reflexes were as follows:  2+ at the right biceps and right  brachioradialis, 1+ at the right triceps, 1+ at the left biceps and right  brachioradialis, 2+ at the left triceps, 2+ at the patella tendons  bilaterally, 2+ at the Achilles bilaterally, toes are downgoing.  There were  a couple of clonus in both lower extremities noted.   Motor strength 5/5 at shoulder abductors, biceps, triceps, wrist extensors  on the right, wrist extensors on the left were 4-/5, intrinsics and finger  flexors were 5/5 bilaterally.   Motor strength in the hip flexors was 5/5 bilaterally, 5/5 at knee  extensors, dorsiflexors, plantar flexors and EHL as well as knee flexors.   Coordination was grossly intact.  Sensory exam revealed deficits in the left  upper extremity at left C5 and left C7 and patchy throughout the left lower  extremity.   MRI done July 07, 2003 showed residual stenosis at C6-7 status post fusion,  cord flattening at C5-6 8 mm central canal noted, L5 root compression,  questionable abnormal cord signal and anterior plating noted at C6-7 and  observation of left C7 root encroachment as well.   IMPRESSION:  Cervical stenosis C5-6 at 8 mm status post C6-7 anterior  plating Dr. Elvera Maria approximately 2 years ago, left C7 root encroachment,  left C5 root encroachment, questionable abnormal cord signal, history of  asthma, positive nicotine addiction, insomnia.   PLAN:  We will start patient on Lidoderm patch 5% one to three patches 2  hours on, 2 hours off (#90), nursing staff will spend time educating patient  on how to use these; Elavil 10 mg one p.o. at bedtime (#31); Ultracet one to  two tablets p.o. q.8h. (#180).  Patient has a followup appointment with  neurosurgeon in [facility] December 05, 2003.  Patient will provide  prior neurosurgical notes from earlier evaluation this Spring.  Patient will provide MRI which was done July 07, 2003 for  review.  We will see patient  back in 1 month.     Brantley Stage, M.D.   DMK/MedQ  D:  11/03/2003 11:29:02  T:  11/04/2003 12:27:59  Job #:  [id number]   cc:   Emmit Alexanders, M.D.  [address] [place] [address]  Fax: [phone]

## 2010-07-26 NOTE — Op Note (Signed)
NAMECHIEKO, NEISES                  ACCOUNT NO.:  [id number]   MEDICAL RECORD NO.:  [id number]          PATIENT TYPE:  INP   LOCATION:  5729                         FACILITY:  [facility]   PHYSICIAN:  Lebron Conners, M.D.   DATE OF BIRTH:  [birth year]   DATE OF PROCEDURE:  02/25/2006  DATE OF DISCHARGE:  02/27/2006                               OPERATIVE REPORT   PREOPERATIVE DIAGNOSIS:  Recurrent incisional hernia.   POSTOPERATIVE DIAGNOSIS:  Incarcerated recurrent incisional hernia with  partial small-bowel obstruction.   OPERATION:  1. Laparoscopy.  2. Lysis of adhesions.  3. Open repair of incarcerated incisional hernia.   SURGEON:  Lebron Conners, M.D.   ANESTHESIA:  General.   PROCEDURE:  After the patient was monitored and asleep and had routine  preparation and draping of the abdomen, I made a short transverse  incision in the left upper quadrant, cut down through the fat, incised  the external oblique in the direction of the fibers and then split the  internal oblique and transversus and pulled up the transversalis fascia  and peritoneum and cut that and bluntly entered the peritoneal cavity.  There were no adhesions in that region.  I put in a 0 Vicryl pursestring  suture in the deeper layers and secured a Hassan cannula and inflated  the abdomen with CO2.  I then viewed the lower part of the abdomen and  saw a good deal of adhesion of omentum to the anterior abdominal wall  and in the hernia sac and there also appeared to be some small bowel in  the hernia as well.  Under direct view I put in two 5 mm ports in the  left side of the abdomen, noting no visceral injuries.  I then worked  with blunt dissection, cautery and scissors to try to reduce the hernia.  After working quite some time, I would estimate about an hour, I had  determined that there was a good deal of adherence of the small  intestine to the mesh and that the dissection was very difficult and  that even  with pressure on the bowel above and traction below and a good  deal of dissection, I could not reduce the hernia.  I therefore  abandoned the laparoscopic technique.  I removed the ports, tied the  pursestring suture and then closed the fascia of the larger incision  utilizing figure-of-eight suture of 0 Vicryl.  I made a midline incision  discarding the old skin scar and dissected down through the fat to the  hernia and then dissected the normal subcutaneous tissues away from it.  I opened the hernia sac and found that there was a good deal of  incarcerated small bowel which was quite thickened and partially  obstructed.  I opened the edge of the hernia a little bit and then was  able to reduce the incarcerated small bowel.  I took down adhesions  further being very careful not to injure the small bowel and until I had  the entire incarcerated part of the small bowel freed up and the edges  of the fascia cleared quite nicely.  I tested the small bowel and made  sure that there was no injury and that its integrity was good.  I then  reduced it and further cleared the hernia, dissecting skin and  subcutaneous flaps laterally on each side to the edge of the rectus  muscles.  I found a couple of additional areas of hernia farther up in  the wound.  After I had debrided the attenuated fascia and useless mesh  and old suture, I closed the midline incision with running 0-0 Prolene  stitch and it came together with minimal amount of tension.  I had  nicely dissected areas in all directions including cephalad and down to  the pubis.  I trimmed a piece of polypropylene mesh to be about 15 cm in  length and 10 cm wide and sewed that in with running basting 2-0 Prolene  suture in the superficial part of the fascia.  I felt that the hernia  repair was strong.  I got hemostasis and irrigated the wound thoroughly.  I placed two suction drains through the previous port sites laterally on  the left and  secured them  with Prolene suture.  I then closed the skin with staples closing all  incisions.  Drains held suction nicely.  I applied bandages to all the  incisions.  Blood loss was minimal.  There were no complications.  There  was no specimen sent to lab.  Sponge, needle and instrument counts were  correct.  The patient went to PACU in good condition.      Lebron Conners, M.D.  Electronically Signed     WB/MEDQ  D:  02/28/2006  T:  03/02/2006  Job:  [id number]

## 2010-07-26 NOTE — Procedures (Signed)
[name], [name]                  ACCOUNT NO.:  [id number]   MEDICAL RECORD NO.:  [id number]          PATIENT TYPE:  OUT   LOCATION:  SLEEP CENTER                 FACILITY:  [facility]   PHYSICIAN:  Clinton D. Maple Hudson, M.D. DATE OF BIRTH:  Nov 28, 1965   DATE OF STUDY:  01/16/2005                              NOCTURNAL POLYSOMNOGRAM   INDICATION FOR STUDY:  Insomnia with sleep apnea.   REFERRING PHYSICIAN:  Dianna Rossetti, MD   EPWORTH SLEEPINESS SCORE:  22/24.   BMI:  49.5.   WEIGHT:  263 pounds.   HISTORY:  The patient describes waking at night and smoking through the rest  of the night.   MEDICATIONS:  1.  Advair.  2.  Flonase.  3.  Albuterol.  4.  Lorazepam.  5.  Zoloft.  6.  Furosemide.  7.  Alprazolam.  8.  Singulair.  9.  Amitriptyline.  10. Tramadol.  11. Topamax.  12. Lupron.   SLEEP ARCHITECTURE:  Total Sleep Time:  359 min.  Sleep Efficiency:  92%.  Stage I:  1%  Stage II:  79%.  Stages III and IV:  Absent  REM:  12% of total sleep time.  Sleep Latency:  25 min.  REM Latency:  258 min.  Awake after sleep Onset:  5 min.  Arousal Index:  12.5.   She took amitriptyline at 10 p.m. and had smoked before arrival at the Sleep  Center.  She indicated she slept better at the Sleep Center than is usual at  home.   RESPIRATORY DATA:  Apnea Hypopnea Index (AHI, RDI):  10.5 obstructive events  per hr, indicating mild obstructive sleep apnea/hypopnea syndrome.  There  were 7 obstructive apneas and 56 hypopneas.  Most events were associated  with supine sleep position (AHI 28.9) and REM (AHI 43.3); but obstructive  events were noted in all sleep positions.  She did not have enough events  early enough to permit use of CPAP titration by split protocol on the study  night.   OXYGEN DATA:  Mild to moderate snoring with oxygen desaturation to an 80-  85%.  Mean oxygen saturation through the study was 95% on room air.   CARDIAC DATA:  Normal sinus rhythm.   MOVEMENT-PARASOMNIA:  No significant movement disturbance noted.   IMPRESSIONS-RECOMMENDATIONS:  1.  Mild obstructive sleep apnea/hypopnea syndrome, AHI 10.5/hr with mild to      moderate snoring and oxygen desaturation to 85%.  2.  Consider return for CPAP titration if more conservative measures such as      weight loss are not effective.  3.  Attention to good sleep habits and smoking cessation would be helpful.      Clinton D. Maple Hudson, M.D.  Diplomate, Biomedical engineer of Sleep Medicine  Electronically Signed     CDY/MEDQ  D:  01/19/2005 11:45:05  T:  01/20/2005 17:22:48  Job:  [id number]   cc:   Sibyl Parr. Darrick Penna, M.D.  Fax: [phone]

## 2010-07-26 NOTE — Assessment & Plan Note (Signed)
[facility]   [name], [name]                  ACCOUNT NO.:  [id number]   MEDICAL RECORD NO.:  [id number]      DATE OF BIRTH:  1965/04/08   PHYSICIAN:  Theresia Majors. Tanda Rockers, M.D.      VISIT DATE:                                   OFFICE VISIT   VITAL SIGNS:  Blood pressure is 145/75, respirations 22, pulse rate 64,  temperature 97.6.   PURPOSE OF TODAY'S VISIT:  [name] is a 45 year old lady who we follow  for a wound dehiscence following abdominal ventral herniorrhaphy.  She  has been treated with a wound VAC per [facility].  Since her  last visit, she denies fever, her pain has been controlled with the  addition of Tylox per Dr. Jamey Ripa.  She did fill the prescription for 25  mcg Duragesic patches which were ineffective.  There has been no  malodorous drainage, there has been no redness.   WOUND EXAM:  Inspection of the wound shows that there is no evidence of  hyperemia or cellulitis, the wound cavity itself has decreased  significantly, there has been no undermining.  The wound was  photographed, measured and entered into the wound expert.  There is no  evidence of active infection, there is no tenderness on palpation.   DIAGNOSIS:  Clinical improvement responding to [facility] therapy.   MANAGEMENT PLAN & GOAL:  We will place the patient in a moist-moist  dressing, her wound VAC will be reapplied within 24 hours per the  [facility] and we will reevaluate the patient in one month.  She will continue the analgesia as ordered per Dr. Jamey Ripa.           ______________________________  Theresia Majors. Tanda Rockers, M.D.     Cephus Slater  D:  05/14/2006  T:  05/14/2006  Job:  [id number]

## 2010-07-26 NOTE — Discharge Summary (Signed)
[name], [name]                  ACCOUNT NO.:  [id number]   MEDICAL RECORD NO.:  [id number]          PATIENT TYPE:  INP   LOCATION:  5004                         FACILITY:  [facility]   PHYSICIAN:  Paula Compton, MD        DATE OF BIRTH:  Jul 01, 1965   DATE OF ADMISSION:  11/19/2007  DATE OF DISCHARGE:  11/22/2007                               DISCHARGE SUMMARY   DISCHARGE DIAGNOSES:  1. Chronic obstructive pulmonary disease exacerbation.  2. Gastroenteritis.  3. Dehydration.  4. Anxiety.  5. Depression.  6. Hypertension.  7. Chronic back pain.  8. Tobacco dependence.  9. Gastroesophageal reflux disease.   CONSULTS:  None.   PROCEDURES/STUDIES:  1. Chest x-ray on November 19, 2007.  Impression:  Chronic interstitial changes.  No acute cardiopulmonary  process.  1. Chest x-ray on November 22, 2007, left base segmental atelectasis,      pulmonary hyperaeration.  No focal consolidation.   DISCHARGE LABORATORY DATA:  BMET, sodium 140, potassium 4.1, increased  from admission potassium of 3.4, chloride 104, bicarb 31, BUN 11,  creatinine 0.78, glucose of 128, and calcium of 9.2.   BRIEF HISTORY AND PHYSICAL:  A 45 year old female with history of asthma  and COPD presented to clinic with history of shortness of breath and  increased O2 requirement at home from normal 1 to 1-1/2 L to 2 to 3 L  over the past 2 days as well as nonproductive cough.  The patient was  directly admitted from PCP's clinic for COPD exacerbation.  The patient  also complains of diarrhea x2 days and admitted to a 15-pound weight  loss in 2 weeks with decreased appetite.  Denied any nausea, bloody  stool, or abdominal pain.   HOSPITAL COURSE:  1. COPD exacerbation.  The patient was initially seen in clinic with      diagnosis of asthma exacerbation versus COPD exacerbation.  The      patient had increased O2 requirement, however, did not have      increased sputum production with shortness of breath and  cough.      The patient was admitted and started on albuterol and Atrovent      nebulizers as well as Advair, Flonase, Singulair, and oral      prednisone.  Chest x-ray did not show any concern for pneumonia,      however, the patient did receive 48 hours of doxycycline.  During      the admission,  patient was able to maintain saturations on O2 of 2      L with improvement in shortness of breath with ambulation over a      course of admission.  Prior to discharge, the patient was      ambulating with 2 L of O2 maintaining saturations of greater than      96%.  The patient was given guaifenesin for cough p.r.n. and was      sent home on prednisone 60 mg daily to complete 10-day course as      well as Spiriva for COPD maintenance.  2. Diarrhea.  The patient had a history of diarrhea prior to      presentation to hospital, thought to be possibly viral etiology.      There were no stool studies performed during admission.  Diarrhea      improved as well as improvement in appetite in p.o. intake.      Further evaluation of reported 15-pound weight loss by the patient      will be done as outpatient by PCP.  There was no evident of GI      bleed during the admission.  3. Dehydration, most likely secondary to gastrointestinal symptoms.      The patient was rehydrated with IV fluids.  4. Anxiety.  The patient was continued on alprazolam per home      medications.  5. Depression.  No change in depression or psychiatric medications.      Continued on Celexa, Wellbutrin, and lamotrigine.  6. Hypertension.  The patient's BP was transiently elevated during      admission.  Lasix was initially held due to need for IV fluid      rehydration as patient was very dry on admission.  After P.O.      intake improved, IV fluids were decreased and Lasix was restarted      prior to discharge per home dose.  7. Chronic back pain.  Continued on Voltaren and MS Contin p.r.n.  8. Tobacco dependence.  The patient  was given nicotine 7 mg patch      transdermal.  The patient was currently trying to quit smoking, as      had best improvement over the past months.  9. GERD.  Continued on Protonix 40 mg daily.   DISCHARGE MEDICATIONS:  1. Prednisone 60 mg 1 tablet p.o. daily x6 days.  2. Lasix 20 mg 1 tablet p.o. b.i.d.  3. Spiriva inhaler 1 puff daily.  4. Singulair 10 mg 1 tablet p.o. nightly.  5. Flonase 1-2 sprays daily.  6. Celexa 40 mg 1 tablet p.o. daily.  7. Wellbutrin 450 mg 1 tablet p.o. daily.  8. Lamictal 100 mg 1 tablet p.o. b.i.d.  9. Protonix 40 mg 1-2 tablets p.o. daily.  10.Trusopt eye drops as prescribed.  11.Advair inhaler 1 puff b.i.d.  12.Albuterol inhaler 2 puffs every 4 hours p.r.n.  13.Albuterol nebulizer every 4 hours, if inhaler not used p.r.n.  14.Claritin-D 10/240 mg 1 tablet p.o. daily.  15.Voltaren 75 mg 1 tablet p.o. b.i.d. with food.  16.MS Contin twice daily as needed for pain.  17.Home O2 2 L.  18.Lyrica 75 mg p.o. daily.   <DISCHARGE CONDITION > Stable/Improved  <ISSUES FOR FOLLOW-UP>  Any necessary evaluation for history of non-  intentional weight loss.   FOLLOWUP:  The patient is to follow up with primary care physician, Dr.  Ruthe Mannan, [facility].  Phone number is [phone].      Milinda Antis, MD  Electronically Signed      Paula Compton, MD  Electronically Signed    KD/MEDQ  D:  11/23/2007  T:  11/24/2007  Job:  [id number]   cc:   Ruthe Mannan, M.D.

## 2010-07-26 NOTE — Op Note (Signed)
[name], [name]                  ACCOUNT NO.:  [id number]   MEDICAL RECORD NO.:  [id number]          PATIENT TYPE:  AMB   LOCATION:  SDC                           FACILITY:  [facility]   PHYSICIAN:  Osborn Coho, M.D.   DATE OF BIRTH:  01/07/1966   DATE OF PROCEDURE:  06/25/2004  DATE OF DISCHARGE:                                 OPERATIVE REPORT   PREOPERATIVE DIAGNOSES:  1.  Menorrhagia.  2.  Dysmenorrhea.  3.  Questionable endometriosis.  4.  Questionable polyp.   POSTOPERATIVE DIAGNOSES:  1.  Menorrhagia.  2.  Dysmenorrhea.  3.  Questionable endometriosis.  4.  Questionable polyp.   PROCEDURES:  Hysteroscopy, dilatation and curettage, and aborted endometrial  ablation.   ATTENDING:  Osborn Coho, M.D.   ANESTHESIA:  General via LMA.   SPECIMENS:  Endometrial curettings.   ESTIMATED BLOOD LOSS:  approx. 100 mL. (anesthesia overestimate)   COMPLICATIONS:  Unable to dilate internal cervical os to allow passage of  Novasure instrument without tenaculum pulling through cervical tissue.   FINDINGS:  Polypoid endometrium.   PROCEDURE:  The patient was taken to the operating room after the risks,  benefits, and alternatives were discussed with the patient, the patient  verbalized understanding and a consent signed and witnessed.  The patient  was placed under general anesthesia and prepped and draped in the normal  sterile fashion.  A bivalve speculum was placed in the patient's vagina and  the anterior lip of the cervix grasped with a single-tooth tenaculum.  The  cervix was then dilated for passage of the hysteroscope.  The uterus was  sounded to 10.5 cm and the cervix was noted to be approximately 4 cm in  length.  The hysteroscope was introduced and polypoid endometrium noted.  Curettage was performed and curettings sent to pathology.  The cervix was  thin and was attempted to be dilated for passage of the NovaSure instrument.  Upon trying to dilate the internal os  greater than dilator #23, the  tenaculum continuously pulled through the cervical tissue.  Finally the  Novasure instrument passed into the uterine cavity but would not open to a  width greater than zero cm.  The decision was made to abort the ablative  procedure inorder to avoid uterine perforation.  The anterior lip of the  cervix was then repaired with 0 Vicryl as well as the posterior lip of the  cervix.  There was significant difficulty with keeping the speculum in the  patient's vagina secondary to body habitus throughout the case. Monsel's  solution was placed on the cervix for hemostasis, and there was minimal  bleeding at  the end of the procedure.  Vaginal packing was placed to assure hemostasis  and will be removed in the PACU in two hours.  Sponge, lap and needle count  was correct.  The patient tolerated the procedure well and is currently  being transferred to the recovery room.      AR/MEDQ  D:  06/25/2004  T:  06/25/2004  Job:  [id number]

## 2010-07-26 NOTE — Assessment & Plan Note (Signed)
MEDICAL RECORD NUMBER:  [id number].   [name] is a married 45 year old white female who is being seen in our pain  and rehabilitative clinic for chronic neck and arm pain.   She was last seen by me on February 24, 2005.   She is back in today and reports an overall worsening of her neck and arm  pain over the last six weeks or so. She describes her average pain as about  an 8 on a scale of 10, interferes a great deal with general activity and  enjoyment of her life. Pain is fairly constant. Sleep has been poor. Little  relief with current medications is noted.   Pain is described as sharp, stabbing, aching and constant in nature.   Able to walk five minutes at a time. She can climb stairs. She drives.   Independent with feeding, bathing and toileting. Requires assistance with  dressing, meal prep, household duties, shopping.   Admit to some bladder problems. Some bowel control problems. No suicidal  ideation. Does admit to anxiety and depression.   No changes in past medical, social or family history since last visit.   PHYSICAL EXAMINATION:  VITAL SIGNS:  Blood pressure is 120/48, pulse 84,  respirations 16, 99% saturated on room air.  GENERAL:  She is a well-developed, obese female who appears her stated age.  She does not appear in any distress during our interview. She is able to  stand. Her gait in the room is nonantalgic, is notable for good balance. She  has a normal base of support, normal heel/toe mechanics. Limitations in  cervical range of motion are noted in all planes, especially with rotation  to the left and right. She has limited shoulder motion on the right  secondary to pain, full shoulder motion on the left.   Tandem gait is normal. Romberg's test is negative. Seated reflexes are 1 to  2+ throughout. No obvious focal differences side to side with respect to  reflexes. Motor strength is 5/5 in the upper extremities. Her intrinsics on  the right are, however,  about a half a grade weaker than on the left.   Tenderness to palpation noted in the upper trapezius musculature  bilaterally. No abnormal tone is noted in the lower extremities. No clonus  is noted.   IMPRESSION:  1.  History of cervical stenosis at C5-6, status post fusion anterior      plating at C6-7.  2.  Nicotine addiction.  3.  History of positive drug screen for marijuana.  4.  Asthma.  5.  Hypertension.  6.  History of anxiety/depression.  7.  Worsening of right arm pain over last six to eight weeks.   PLAN:  Will obtain cervical flexion/extension films. Will increase her  Topamax to 25 mg 1 p.o. q.8h. for 1 week, then 2 p.o. b.i.d. #120 given.  Will switch her to Ultram 50 mg 1 p.o. q.6h. #120. We will refill her  Effexor 75 mg 1 p.o. daily #30 with 3 refills and amitriptyline 10 mg 1 p.o.  nightly #30.   Will review cervical spine films. Today we discussed possibly sending her  back to John P. Ollen Barges, neurosurgeon in [facility], who evaluated her back  in November of 2005. At that time, a cervical myelogram was done that showed  canal stenosis from C5 to the level of C6-7. Her central stenosis effaced  the ventral cerebral spinal fluid space but had no significant cord  displacement or compression. There  did appear to be good bone formation  within surrounding the patient's interbody graft at C6-7. She also was noted  to have some residual arthritic disease but none of this in his opinion at  that time warranted any surgical intervention.   Also discussed with Ms. Hines possibly treating her cervicalgia with medial  branch blocks. Will think about this over the next two weeks, and we may or  may not refer her back to Dr. Ollen Barges.           ______________________________  Brantley Stage, M.D.     DMK/MedQ  D:  04/14/2005 13:46:12  T:  04/14/2005 15:00:43  Job #:  [id number]

## 2010-07-26 NOTE — Assessment & Plan Note (Signed)
DATE OF VISIT:  May 29, 2004.   MEDICAL RECORD NUMBER:  [id number].   DATE OF BIRTH:  1965/07/10.   [name] is back in today for a recheck and a refill of her medications.   She is a 45 year old, married, white female who is back in for recheck.  She  was last seen on March 29, 2004.   She reports she has had multiple medical problems over the last several  weeks.  She is being followed by her OB/GYN for workup for endometriosis and  vaginal bleeding which she has had since January of 2006.  She also has an  apparent problem with her left breast, which is being looked into as well.   She is being seen here for cervicalgia and lumbago.  Her average pain in her  neck and back area is about a 5 on a scale of 10.  She describes it as  sharp.  Her sleep is fair.  Her pain is worse with walking, bending and  standing and improves with rest and TENS unit.  She is getting between fair  and good relief with the medications at this time.  She is able to walk  without assistance.  She can walk about 10 minutes at a time.  She is able  to climb stairs and drive.  She needs some assistance with household duties  and shopping.  She admits to bladder control problems, depression and  anxiety.   Admits to night sweats, shortness of breath, coughing and wheezing.   Denies any new changes in her past medical, social or family history since  our last visit.   PHYSICAL EXAMINATION:  Blood pressure 121/65, pulse 77, respirations 20, 98%  saturated on room air.  She is a well-developed, obese, white female.  She  is oriented x 3.  Affect is overall tired, but appropriate.  She is able to  stand independently from a seated position.  Her gait is normal.  She has  limitations in range of motion in her cervical range.  Her motor strength is  5/5 in upper and lower extremities.   She has tenderness in the left upper trapezius.   IMPRESSION:  1.  Cervical stenosis at C5-6.  2.  Status post  fusion with anterior plating of C6-7.  3.  Cord flattening at C5-6 with spinal stenosis.  4.  Nicotine addiction.  5.  Positive urine drug screen for marijuana.  6.  Asthma.  7.  Hypertension.  8.  Depression and anxiety.   PLAN:  Will refill the following medications for Ms. Herold Harms today:  Ultracet  one to two p.o. q.8h. p.r.n. #180, Effexor XR 75 mg one p.o. q.a.m. (samples  were given today for this, a month's worth), amitriptyline 10 mg one p.o.  q.h.s. #30 and Topamax 25 mg one p.o. b.i.d.  Will see her back in a month.      DMK/MedQ  D:  05/29/2004 17:51:02  T:  05/30/2004 09:56:12  Job #:  [id number]

## 2010-07-26 NOTE — Consult Note (Signed)
Kelly, [name]                  ACCOUNT NO.:  [id number]   MEDICAL RECORD NO.:  [id number]          PATIENT TYPE:  REC   LOCATION:  FOOT                         FACILITY:  [facility]   PHYSICIAN:  Theresia Majors. Tanda Rockers, M.D.DATE OF BIRTH:  Apr 07, 1965   DATE OF CONSULTATION:  04/28/2006  DATE OF DISCHARGE:                                 CONSULTATION   REASON FOR CONSULTATION:  [name] is a 45 year old female who is  referred by Dr. Jamey Ripa for evaluation and management of an abdominal  dehisced wound.   IMPRESSION:  Wound dehiscence following ventral herniorrhaphy.   PLAN:  We will initiate Wound-V.A.C. therapy and monitor the patient in  consultation with the home health nurse to effect closure.   SUBJECTIVE:  [name] is a 45 year old female who underwent a ventral  herniorrhaphy in December 2007.  Her postoperative course was  complicated by a seroma and subsequently a wound infection with  dehiscence.  The patient was initially evaluated and was treated with a  moist-moist dressings and made a transition to a Wound-V.A.C., which has  been placed by [facility].  In the interim she has denied  fevers.  There has been no excessive malodorous drainage.  There has  been no interim trauma.   PAST MEDICAL HISTORY:  Significant for chronic obstructive pulmonary  disease  and asthma.  She is a patient in the [facility] at  [facility].   Her current medication list includes:  1. Advair 500/50 mcg b.i.d.  2. Lasix 20 mg daily.  3. Singulair 10 mg daily.  4. Loratadine 10 mg daily.  5. Prilosec 20 mg daily.  6. Topamax 25 mg t.i.d.  7. Alprazolam 1 mg 3-4 times a day.  8. Sertraline 100 mg b.i.d.  9. Albuterol 0.83 mg/mL nebulizer q 4-6h.  10.Albuterol inhaler one to two puffs q.6h.  11.Flonase one two times a day.  12.Oxycodone 5/500 mg q.4h. p.r.n. for pain.   The patient denies allergies.   Her past surgery has included a laminectomy, two C-sections and  multiple  abdominal herniorrhaphies.   FAMILY HISTORY:  Positive for diabetes, hypertension, cardiovascular  disease and cancer.   SOCIAL HISTORY:  She is married.  She is disabled.  She lives in  [place].  She is a smoker and has been diagnosed with depression in the  past.  She is also a recovered alcoholic.  The remainder of the review  of systems is negative.   PHYSICAL EXAMINATION:  GENERAL:  She is an alert, oriented, quite  anxious female complaining of pain in her abdomen.  She is oriented to  time, place and person and appears to be in good contact with reality,  responding appropriately to questioning.  VITAL SIGNS:  She is 265 pounds, 5 feet 1 inch tall.  Blood pressure is  126/92, respirations 24, pulse rate 86, and temperature 97.7.  HEENT:  Clear.  NECK:  Supple.  Trachea is midline.  CHEST:  There are inspiratory as well as pan-expiratory wheezes and  rhonchi throughout both lung fields.  HEART:  The heart sounds are distant.  ABDOMEN:  Protuberant with a midline incision which is open in the  distal portion.  There is a large granulating sinus approximately 4 cm  in diameter with tunneling deep to the fascia.  There is no active  drainage at present.  There are no loculations discernible.  There is  moderate hyperemia.  EXTREMITIES:  Remarkable for 1-2+ edema but she has a readily palpable  dorsalis pedis pulse.  NEUROLOGIC:  She is grossly intact.   DISCUSSION:  This 45 year old lady has a dehisced wound.  Her current  complaints of pain but no fever are consistent with the physical exam.  We have explained our approach to her.  Our approach to her will involve  continuation of the Wound-V.A.C. with serial follow-up exams in the  wound center, initially at 2-week intervals and, hopefully, extending to  monthly intervals.  We we treat her with a Duragesic patch starting at  25 mcg for control of the discomfort that she is having.  In addition,  we will start her  on Cipro 250 mg p.o. b.i.d. as treatment for positive  culture of Pseudomonas taken at [facility] on February  18.   We have given the patient an opportunity to ask questions.  She seems to  understand the treatment as outlined above and indicates that she will  be compliant.  In the interim we have packed the wound with a moist-  moist dressing and the home health nurse will resume the Wound-V.A.C.  within 24 hours.           ______________________________  Theresia Majors Tanda Rockers, M.D.     [name]  D:  04/28/2006  T:  04/28/2006  Job:  [id number]   cc:   Currie Paris, M.D.  [facility]

## 2010-07-26 NOTE — Assessment & Plan Note (Signed)
DATE:  October 30, 2004.   BIRTH DATE:  [birth year].   [name] is a 45 year old married white female who is accompanied this  morning by her 61 and 83-year-old sons.   This was a very difficult assessment this morning.  The children were quite  loud and were not well behaved.  They kept climbing on furniture,  interrupting the office visit throughout.   [name] was last seen in our clinic May 29, 2004.  Apparently in the  interim, from what I gather, she has had multiple surgeries, a D&C in April  followed by an ablation which apparently was not successful.  She also had a  breast biopsy which was benign and an abdominal hernia surgery repair in  April or May as well.   She comes in.  Her main complaint today is bilateral shoulder and upper back  pain as well as low back pain.  Her  pain is described as about an 8 on a  scale to 10.  It is constant and sharp and aching in nature.  It is worse  with bending, walking, and standing.  It improved with rest, medication, and  TENS unit.  Current medications give her a little bit of relief.   She reports the last time she received medications from this clinic was back  in February.  Apparently she discontinued her medications on her own without  notifying us.  She missed her last appointment with [place] which was scheduled  for June 26, 2004.   She is currently able to walk without assistance for about 10 to 15 minutes  at a time.  She is able to climb stairs, and she drives.  She is independent  with feeding, bathing, toileting.  Reports requiring assistance with  dressing, mail, parcel duty, shopping.  She is home during the day with her  21-year-old caring for him.   She reports some bladder control problems, weakness, numbness, tingling,  depression, anxiety.  Denies suicidal ideations.  She also reports night  sweats, diarrhea, abdominal pain, poor appetite, limb swelling, cough,  shortness of breath, and wheezing.   PAST  MEDICAL AND SURGICAL HISTORY:  Changes noted in History of Present  Illness.   SOCIAL HISTORY:  Unchanged from past visit.  She continues to smoke 2 packs  of cigarettes a day.  Admits to illegal drug use and driving under the  influence of alcohol in the past.   FAMILY HISTORY:  Remarkable for lung disease, psychiatric problems, and  alcohol abuse.   PHYSICAL EXAMINATION:  VITAL SIGNS:  Blood pressure 126/83, pulse 84,  respirations 16, O2 saturation 98% on room air.  GENERAL:  Well-developed, obese, black female.  She is oriented x3.  She is  alert and cooperative.  She is quite distracted today with her children in  the room, however.  She is able to stand after being seated.  Her gait is  normal.  There is no analgia.  She has some limitations in all planes with  lumbar range of motion.  Reflexes are 2+ at the biceps, triceps, and  brachial radialis, 2+ at the patellar tendons, Achilles tendons.  There is  no clonus noted, and there is no abnormal muscle tone noted in the upper or  lower extremities.  Motor strength is good throughout in both upper and  lower extremities.  She reports some tenderness in the left shoulder region  and movement with abduction and internal and external rotation bother her  neck somewhat during passive rotation.  She has tenderness with palpation  throughout the paracervical and parascapular musculature, also some  tenderness in particularly the right lumbar paraspinal muscles.   IMPRESSION:  1.  History of cervical stenosis C5-6.  2.  Status post fusion with anterior plating C6-7.  3.  Cord flattening at C5-6 with spinal stenosis.  4.  Nicotine addiction.  5.  History of positive urine drug screen for marijuana.  6.  Asthma.  7.  Hypertension.  8.  History of anxiety and depression.   PLAN:  1.  We will go ahead and refill her medications, caution her against      stopping medications, especially Topamax without consulting [place].  Will      need  to be tapered.  I cautioned her regarding the issue of seizures.      She expresses understanding verbally.  2.  Will refill amitriptyline 10 mg 1 p.o. nightly, #30; Topamax 25 mg 1      p.o. nightly x 7 days, then b.i.d., #60; Ultracet 1 to 2 p.o. q.8h.,      #180; and Effexor XR 75 mg 1 p.o. q.a.m., #30.  3.  Will have her set up to see Dr. Leonides Cave for coping strategies regarding      anxiety and depression.  4.  Will see her back in 3 months.           ______________________________  Brantley Stage, M.D.     DMK/MedQ  D:  10/30/2004 12:51:58  T:  10/30/2004 15:08:29  Job #:  [id number]

## 2010-07-26 NOTE — Op Note (Signed)
[name], KNOCHE                  ACCOUNT NO.:  [id number]   MEDICAL RECORD NO.:  [id number]          PATIENT TYPE:  OIB   LOCATION:  2889                         FACILITY:  [facility]   PHYSICIAN:  Leonie Man, M.D.   DATE OF BIRTH:  Mar 15, 1965   DATE OF PROCEDURE:  09/02/2004  DATE OF DISCHARGE:                                 OPERATIVE REPORT   PREOPERATIVE DIAGNOSIS:  Incarcerated ventral incisional hernia.   POSTOPERATIVE DIAGNOSIS:  Incarcerated ventral incisional hernia.   PROCEDURE:  Repair of ventral hernia with mesh.   SURGEON:  Leonie Man, M.D.   ASSISTANT:  Magnus Ivan, RNFA.   ANESTHESIA:  General.   SPECIMENS:  There were no specimens sent to lab.   ESTIMATED BLOOD LOSS:  The estimated blood loss was minimal.   COMPLICATIONS:  None apparent.   DISPOSITION:  The patient was moved to the PACU in excellent condition.   HISTORY:  The patient is a 45 year old obese, asthmatic woman status post  open appendectomy through a midline incision in the remote past who now has  a hernia in that incision. The hernia is quite painful and enlarging in  size. She comes to the operating room for repair of this hernia after the  risks and potential benefits of surgery had been fully discussed. All  questions answered and consent obtained.   PROCEDURE IN DETAIL:  Following induction of satisfactory anesthesia, with  the patient positioned supinely, the abdomen was prepped and draped to be  included in a sterile operative field. The old scar cicatrix was excised in  an elliptical incision, and dissection was carried down to the hernia sac.  The sac was opened. Dissection was carried down to the base of the sac where  the surrounding fascia is opened. The sac contained incarcerated omentum,  none of which appeared to be nonviable. The omentum was then reduced back  into the peritoneal cavity. Adhesions to the abdominal wall were taken down.  There were multiple adhesions of  small bowel to the abdominal wall. These  were taken down and the small bowel was repaired with interrupted 3-0 silk  sutures. The bowel was then covered with Seprafilm and an extra large mesh  plug was then placed into the defect and sutured in place with interrupted  sutures of #1 Novofil. At the end of this, the repair appeared to be fully  intact. Sponge, instrument, and sharp counts were verified and the  subcutaneous tissues were then closed with interrupted 2-0 Vicryl sutures,  and the skin closed with  running 4-0 Monocryl suture, and then reinforced with Steri-Strips. Sterile  dressings were applied. Anesthetic was reversed. The patient was removed  from the operating room to the recovery room in stable condition. She  tolerated the procedure well.       PB/MEDQ  D:  09/02/2004  T:  09/02/2004  Job:  [id number]

## 2010-07-26 NOTE — Assessment & Plan Note (Signed)
MEDICAL RECORD NUMBER:  [id number].   Ms. Doan is a 45 year old married white female who is back in for a recheck.  She was last seen January 03, 2004. Since I have seen her, she has been able  to follow up with [facility] neurosurgery working under Dr. Tresa Endo, I believe  Dr. Harriet Masson saw her. Has recommended that she continue with conservative  pain management and to continue to work on physical therapy, weight loss  measures.   There was a CT myelogram done. Apparently, there is moderate canal stenosis  from C5 to C6-7. Central stenosis does efface the ventral cerebrospinal  fluid space, but has no significant cord displacement or compression. There  did appear to be good bone formation within and surrounding the patient's  interbody graft at C6-7.   The patient has run out of some of her medications since I saw her last. She  has still been on Effexor, however. She found the 25 mg of Elavil slightly  sedating and was wondering if she could have a lower dose of that. She  continues to take Xanax and Zoloft which are prescribed to her by Dr.  Emilia Beck over at [facility] family practice.   Her average pain is about a 7 on a scale of 10. It is located between her  scapulae and inferiorly to about T12-L1.   She reports it does interfere with activity quite a bit. Sleep is fair. Pain  is fairly constant, sharp in its nature, worse with most activities,  improves with rest sometimes, gets very little relief at this point with her  pain medications although she has been out of them.   Functionally, she is able to walk about 10 minutes. She does not need any  assistance. She is able to climb stairs and drive. She is currently not  employed but works in the house. Is able to clean her home and take care of  her two children, a 77 and a 66-year-old. She has been quite stressed lately.  Her son and husband were involved in a motor vehicle accident about 10 days  ago with some injuries.  Apparently, she has a cousin who has been living  with her recently to help out and has been using marijuana.   She admits to some bladder control problems. Denies any bowel control  problems. Admits to anxiety and depression. Has no suicidal thoughts or  ideation.   She has had no changes in her family history since last visit.   Social history is as note as above. She has been diagnosed with blood  pressure as well.   PHYSICAL EXAMINATION:  Her blood pressure is 138/94, pulse 80, respirations  18, 96% saturated on room air. She is alert and oriented. Appears overall  depressed but answers questions appropriately. She is able to get out of the  chair independently without any difficulty. Her gait in the room is normal.  She has mild limitations with forward flexion, extension, lateral flexion in  her lumbar spine. She has mild limitations in her cervical range, especially  with extension. She has actually fairly good rotation right and left and  forward flexion. Seated reflexes are 2+ in the upper extremities at biceps,  triceps, and brachial radialis. There is no numb spot with light touch. She  has normal motor strength in the upper extremities. No obvious focal  weaknesses noted. No clonus is noted in the lower extremities. Lower  extremity reflexes are 2+ throughout as well. She  has tenderness to  palpation throughout the intrascapular area down to about T10.   IMPRESSION:  1.  Cervical stenosis, C5-6.  2.  Status post fusion with anterior plating, C6-7.  3.  Cord flattening at C5-6 with spinal stenosis.  4.  Nicotine addition.  5.  Insomnia.  6.  Asthma.  7.  Hypertension.  8.  Depression/anxiety.   PLAN:  Dr. Emilia Beck has apparently placed the patient in a physical  therapy program about two to three weeks' ago. She has been seen  approximately four times since then. I do not have any notes regarding the  actual therapy program. Will have her also set up to get a TENS  trial.   We will refill the following medications for her today:  1.  Effexor XR 75 mg 1 p.o. q.d. #30.  2.  Elavil 10 mg 1 p.o. q.h.s. #30.  3.  Will also start Topamax 25 mg 1 p.o. q.a.m. for 3 days, then b.i.d. #60.      I did review side profile with her, advantages and disadvantages of      being on this medication, and she would like to proceed.  4.  We will also refill her Ultracet 1 to 2 tablets p.o. q.8h. p.r.n. pain      #180.   Would like to obtain a UDS at next visit. She has found that Lidoderm was  not particularly helpful and will not prescribe this for her. She also has  been tried on Neurontin and felt it was not that helpful, although I am not  clear on how high her doses have been in the past.       DMK/MedQ  D:  03/29/2004 10:56:01  T:  03/29/2004 11:32:20  Job #:  [id number]   cc:   Hansel Feinstein, M.D.

## 2010-08-02 ENCOUNTER — Ambulatory Visit (INDEPENDENT_AMBULATORY_CARE_PROVIDER_SITE_OTHER): Payer: Medicare Other | Admitting: Family Medicine

## 2010-08-02 ENCOUNTER — Encounter: Payer: Self-pay | Admitting: Family Medicine

## 2010-08-02 VITALS — BP 150/100 | HR 84 | Temp 98.4°F | Ht 61.25 in | Wt 213.0 lb

## 2010-08-02 DIAGNOSIS — I10 Essential (primary) hypertension: Secondary | ICD-10-CM

## 2010-08-02 MED ORDER — HYDROCHLOROTHIAZIDE 25 MG PO TABS: 25.0000 mg | ORAL_TABLET | Freq: Every day | ORAL | Status: DC

## 2010-08-02 NOTE — Patient Instructions (Signed)
Please make appt with Dr Lelon Perla in 3-4 wks for blood pressure.

## 2010-08-02 NOTE — Progress Notes (Signed)
  Subjective:    Patient ID: [name], female    DOB: Oct 21, 1965, 45 y.o.   MRN: [id number]  HPI Elevated BP Pt was seen in [facility] today and was told that her BP was 160/103 and she was not given her pain medicine.  Pt states that her blood pressure is caussing her "to be imbalance".  She has fallen twice: once was last week when her R foot "gave way" and she fell.  2 wks ago she was walking up steps and lost her balance and fell backwards and landed on her back.  Both times her foot was swollen and thinks that this contributed to her falling.  She denies syncope with the falls.    According to Dr Lelon Perla' note from 02/2010 pt stopped taking BP meds on her own and since her BP was at goal, he did not resume her meds.  She was taking HCTZ 25mg  and Lisinopril 20mg .  She thinks it has been high for 2 wks and her feet are swollen.   Pain Center wanted to a letter stating that her BP is elevated and that she is being treated for it.    Review of Systems No chest pain, +dyspnea with 2L use of oxygen she is supposed to use it all the time, but did not bring it in to the clinic.  She has numbness in her feet due to swelling.  No syncope. No palpitations. No nausea or vomiting. No AMS.        Objective:   Physical Exam GEN: nad, alert, appropriate HEENT: EOMI, PERRLA, No papilledema, MMM, no cervical nodes, no jvd CVS: RRR, no murmur RESP: scattered exp wheezing, poor resp effort ABD: obese, NT/ND, +BS EXT: b/l trace edema in LE, +2 pulses Neuro: CN II-XII grossly intact, strength 5/5 in LE and UE, reflexes +2, -babinski, gait steady with cane, finger to nose intact, sensation intact       Assessment & Plan:

## 2010-08-02 NOTE — Assessment & Plan Note (Signed)
Pt was taking Lisinopril 20, HCTZ 25 for HTN until 02/2010 when she took herself off of meds.  Since her BP were at goal, Dr Lelon Perla did not resume her meds.  Pt is here because Pain Clinic would not Rx her pain meds unless she was being treated for HTN.  Her BP is elevated today and I will resume HCTZ at this time, esp since she has trace LE edema and may benefit from diuretic effect of thiazide.  Pt is to rtc in 3-4 wks to see Dr Lelon Perla for f/u.  Will check Bmet today for K and Cr baseline.

## 2010-08-03 LAB — BASIC METABOLIC PANEL
CO2: 31 mEq/L (ref 19–32)
Calcium: 8.7 mg/dL (ref 8.4–10.5)
Creat: 0.57 mg/dL (ref 0.40–1.20)

## 2010-08-06 ENCOUNTER — Telehealth: Payer: Self-pay | Admitting: Family Medicine

## 2010-08-06 NOTE — Telephone Encounter (Signed)
Pt states that she needs a letter stating that her pain meds are not what is making her fall, not the BP.  Questioned patient about the appt needing a letter for BP, but she states that the MD misunderstood her. Advised that I would send a message to MD and see if she would be willing to write the letter ( To Cat Ta)   Question about xanex has to do with the directions and her having a court date.  Advised her to make an appt with Dr. Lelon Perla.  Has appt on June 4,12 Fleeger, Maryjo Rochester

## 2010-08-06 NOTE — Telephone Encounter (Signed)
I discussed the letter with patient when I saw her in clinic.  I wrote letter stating that pt's HTN is being treated by our clinic.  I will not write that pt's falls are from BP and not her pain medications, as I do not believe that to be the cause.  I told pt that her falls may be multifactorial, and one reason may be her pain medications.  She will need to see Dr Lelon Perla to discuss this further with him.   **Again, I will not write a letter stating that HTN is the cause of her falling.

## 2010-08-06 NOTE — Telephone Encounter (Signed)
Wants to know results of labs so that she can get them over to Pain management..  Also has a question about her Xanax

## 2010-08-07 ENCOUNTER — Telehealth: Payer: Self-pay | Admitting: Family Medicine

## 2010-08-07 NOTE — Telephone Encounter (Signed)
LMOVM (identifiable) that "Dr. Janalyn Harder was not comfortable writing the letter that pt is requesting,and the letter she wrote is the only one that she is willing to write" Advised she may call back with further questions.  Fleeger, Maryjo Rochester

## 2010-08-07 NOTE — Telephone Encounter (Signed)
Returning Jessica's call.

## 2010-08-07 NOTE — Telephone Encounter (Signed)
Pt informed of previous note with message from Dr. Janalyn Harder.  States that she will discuss with Dr. Lelon Perla at her next visit.  Will forward to MD for Berenice Primas, Maryjo Rochester

## 2010-08-11 ENCOUNTER — Other Ambulatory Visit: Payer: Self-pay | Admitting: Family Medicine

## 2010-08-12 ENCOUNTER — Encounter: Payer: Self-pay | Admitting: Family Medicine

## 2010-08-12 ENCOUNTER — Ambulatory Visit (INDEPENDENT_AMBULATORY_CARE_PROVIDER_SITE_OTHER): Payer: Medicare Other | Admitting: Family Medicine

## 2010-08-12 DIAGNOSIS — I10 Essential (primary) hypertension: Secondary | ICD-10-CM

## 2010-08-12 DIAGNOSIS — F411 Generalized anxiety disorder: Secondary | ICD-10-CM

## 2010-08-12 DIAGNOSIS — J449 Chronic obstructive pulmonary disease, unspecified: Secondary | ICD-10-CM

## 2010-08-12 MED ORDER — ALPRAZOLAM 1 MG PO TABS: ORAL_TABLET | ORAL | Status: DC

## 2010-08-12 NOTE — Progress Notes (Signed)
  Subjective:    Patient ID: [name], female    DOB: 01-06-1966, 45 y.o.   MRN: [id number]  HPI 1. Anxiety:  Pt continues to require the Xanax.  She did have an issues with getting pulled over after taking the Xanax.  Her Xanax was taken away because the instructions stated for her to take as needed for sleep.  She has been out of her medications for about 1 week and her anxiety is out of control.  2. HTN:  Pt feels much better since the HCTZ has been restarted.  She was having LE swelling and feeling off balance when her BP was elevated.  Since she has started the HCTZ her blood pressure is much better controlled and her other symptoms have resolved  3. COPD:  Breathing status is stable for now.  She was not requiring any O2 in the clinic.  The weather has been pretty stable which has helped with her breathing.   Review of Systems Denies chest pain, shortness of breath.  Denies SI/HI.  Endorses problems with sleep.    Objective:   Physical Exam  Constitutional: She is oriented to person, place, and time. She appears well-developed. No distress.  Eyes: Conjunctivae are normal.  Neck: Normal range of motion. Neck supple.  Cardiovascular: Normal rate and regular rhythm.   Pulmonary/Chest: Effort normal. No respiratory distress. She has wheezes. She has no rales. She exhibits no tenderness.       Decreased breath sounds throughout.  At baseline.  Abdominal: Soft. Bowel sounds are normal.  Musculoskeletal: She exhibits no edema.  Neurological: She is alert and oriented to person, place, and time. No cranial nerve deficit. Coordination normal.       Normal gait and balance  Skin: Skin is warm and dry. She is not diaphoretic.  Psychiatric: She has a normal mood and affect. Her behavior is normal.       Moderately anxious appearing          Assessment & Plan:

## 2010-08-12 NOTE — Assessment & Plan Note (Signed)
Improved with HCTZ.  Advised her to continue taking the medication.  She was also requesting a letter stating that her blood pressure was the reason why she was off balance.  While this doesn't usually cause these symptoms I suppose that it is possible.  She also feels back to normal since her blood pressure is better controlled.  Provided a letter stating as such.

## 2010-08-12 NOTE — Assessment & Plan Note (Signed)
Still dependent on Xanax to make it through the day.  There was a mistake in the instructions with her last prescription which stated that she was supposed to only take it as needed for sleep.  Changed the instructions to say as needed for anxiety.  Also provided a letter stating as such.

## 2010-08-12 NOTE — Assessment & Plan Note (Signed)
Stable with current medications.  No changes.

## 2010-08-23 ENCOUNTER — Other Ambulatory Visit: Payer: Self-pay | Admitting: Family Medicine

## 2010-08-26 NOTE — Telephone Encounter (Signed)
Refill request

## 2010-08-28 ENCOUNTER — Encounter: Payer: Self-pay | Admitting: Family Medicine

## 2010-08-28 ENCOUNTER — Ambulatory Visit (INDEPENDENT_AMBULATORY_CARE_PROVIDER_SITE_OTHER): Payer: Medicare Other | Admitting: Family Medicine

## 2010-08-28 VITALS — BP 127/79 | HR 85 | Temp 98.5°F | Ht 61.0 in | Wt 196.0 lb

## 2010-08-28 DIAGNOSIS — I10 Essential (primary) hypertension: Secondary | ICD-10-CM

## 2010-08-28 DIAGNOSIS — F411 Generalized anxiety disorder: Secondary | ICD-10-CM

## 2010-08-28 DIAGNOSIS — J441 Chronic obstructive pulmonary disease with (acute) exacerbation: Secondary | ICD-10-CM

## 2010-08-28 DIAGNOSIS — E669 Obesity, unspecified: Secondary | ICD-10-CM

## 2010-08-28 MED ORDER — PREDNISONE 20 MG PO TABS: 40.0000 mg | ORAL_TABLET | Freq: Every day | ORAL | Status: DC

## 2010-08-28 MED ORDER — ALPRAZOLAM 1 MG PO TABS: ORAL_TABLET | ORAL | Status: DC

## 2010-08-28 NOTE — Assessment & Plan Note (Signed)
Stable with current regimen.  Refilled Xanax.

## 2010-08-28 NOTE — Assessment & Plan Note (Signed)
Normal POx with home O2.  No respiratory distress.  She frequently requires Prednisone to keep her out of the hospital.  Will prescribe a 5 day course.

## 2010-08-28 NOTE — Progress Notes (Signed)
  Subjective:    Patient ID: [name], female    DOB: 1965-05-18, 45 y.o.   MRN: [id number]  HPI 1. Shortness of breath:  She feels like she is having a COPD exacerbation because of the hot weather.  She seems to not respond well to changes in weather.  She has frequently required the use of Prednisone in order to keep her out of the hospital.  She has been wheezing and she has required the use of O2 at home.  2. Anxiety:  This is stable with Xanax TID.  3. Obesity:  She continues to try and lose weight.  She has lost nearly 100 lbs.  Main difference has been portion control.  4. HTN:  Improved since starting HCTZ.  She doesn't check her blood pressure at home.     Review of Systems Denies chest pain, headache, vision changes.  Denies LE swelling.    Objective:   Physical Exam  Vitals reviewed. Constitutional: She is oriented to person, place, and time. She appears well-developed. No distress.       Wearing O2.  Eyes: Conjunctivae are normal.  Neck: Normal range of motion. Neck supple.  Cardiovascular: Normal rate and regular rhythm.   Pulmonary/Chest: Effort normal. No respiratory distress. She has wheezes. She has no rales. She exhibits no tenderness.       Decreased breath sounds throughout.  At baseline.  Abdominal: Soft. Bowel sounds are normal.  Musculoskeletal: She exhibits no edema.  Neurological: She is alert and oriented to person, place, and time. No cranial nerve deficit. Coordination normal.       Normal gait and balance  Skin: Skin is warm and dry. She is not diaphoretic.  Psychiatric: She has a normal mood and affect. Her behavior is normal.       Moderately anxious appearing          Assessment & Plan:

## 2010-08-28 NOTE — Assessment & Plan Note (Signed)
Well controlled. No changes. 

## 2010-08-28 NOTE — Assessment & Plan Note (Signed)
Continues to lose weight.  Congratulated her on her success.

## 2010-09-30 ENCOUNTER — Other Ambulatory Visit: Payer: Self-pay | Admitting: Family Medicine

## 2010-09-30 NOTE — Telephone Encounter (Signed)
Refill request

## 2010-10-02 ENCOUNTER — Other Ambulatory Visit: Payer: Self-pay | Admitting: Family Medicine

## 2010-10-02 NOTE — Telephone Encounter (Signed)
Refill request

## 2010-10-08 ENCOUNTER — Other Ambulatory Visit: Payer: Self-pay | Admitting: Family Medicine

## 2010-10-08 MED ORDER — HYDROCHLOROTHIAZIDE 25 MG PO TABS: 25.0000 mg | ORAL_TABLET | Freq: Every day | ORAL | Status: DC

## 2010-10-11 ENCOUNTER — Ambulatory Visit (INDEPENDENT_AMBULATORY_CARE_PROVIDER_SITE_OTHER): Payer: Medicare Other | Admitting: Family Medicine

## 2010-10-11 VITALS — BP 117/85 | HR 70 | Temp 98.0°F | Wt 193.0 lb

## 2010-10-11 DIAGNOSIS — G4700 Insomnia, unspecified: Secondary | ICD-10-CM

## 2010-10-11 DIAGNOSIS — I10 Essential (primary) hypertension: Secondary | ICD-10-CM

## 2010-10-11 DIAGNOSIS — J449 Chronic obstructive pulmonary disease, unspecified: Secondary | ICD-10-CM

## 2010-10-11 DIAGNOSIS — F411 Generalized anxiety disorder: Secondary | ICD-10-CM

## 2010-10-11 MED ORDER — ALBUTEROL SULFATE HFA 108 (90 BASE) MCG/ACT IN AERS: 2.0000 | INHALATION_SPRAY | Freq: Four times a day (QID) | RESPIRATORY_TRACT | Status: DC

## 2010-10-11 MED ORDER — MONTELUKAST SODIUM 10 MG PO TABS: 10.0000 mg | ORAL_TABLET | Freq: Every day | ORAL | Status: DC

## 2010-10-11 MED ORDER — ALPRAZOLAM 1 MG PO TABS: 1.0000 mg | ORAL_TABLET | Freq: Two times a day (BID) | ORAL | Status: AC | PRN

## 2010-10-11 MED ORDER — HYDROCHLOROTHIAZIDE 25 MG PO TABS: 25.0000 mg | ORAL_TABLET | Freq: Every day | ORAL | Status: DC

## 2010-10-11 MED ORDER — ALPRAZOLAM 1 MG PO TABS: 1.0000 mg | ORAL_TABLET | Freq: Every day | ORAL | Status: AC | PRN

## 2010-10-11 MED ORDER — ALPRAZOLAM 1 MG PO TABS: ORAL_TABLET | ORAL | Status: DC

## 2010-10-11 MED ORDER — OMEPRAZOLE 20 MG PO CPDR: 20.0000 mg | DELAYED_RELEASE_CAPSULE | Freq: Every day | ORAL | Status: DC

## 2010-10-11 MED ORDER — FLUTICASONE-SALMETEROL 500-50 MCG/DOSE IN AEPB: 1.0000 | INHALATION_SPRAY | Freq: Two times a day (BID) | RESPIRATORY_TRACT | Status: DC

## 2010-10-11 MED ORDER — TIOTROPIUM BROMIDE MONOHYDRATE 18 MCG IN CAPS: 18.0000 ug | ORAL_CAPSULE | Freq: Every day | RESPIRATORY_TRACT | Status: DC

## 2010-10-11 MED ORDER — ALBUTEROL SULFATE (2.5 MG/3ML) 0.083% IN NEBU: 2.5000 mg | INHALATION_SOLUTION | RESPIRATORY_TRACT | Status: DC | PRN

## 2010-10-11 NOTE — Patient Instructions (Signed)
I am sorry that you have been going through such a rough time.  I want you to see your Psychiatrist very soon.  Please call no Monday for an appointment.  Psychiatrists are allowed to prescribe benzodiazepines.  It is my medical opinion that one perscriber should prescribe all your psychotropic medications.  I also feel you are on too large a dose of Xanax to be safe.  I am going to write a three month taper of xanax.  If your psychiatrist feels this medication is indicated, they can continue to prescribe it beyond the next three months.

## 2010-10-11 NOTE — Assessment & Plan Note (Addendum)
Advised pt see psychiatry soon. Pt with drug-seeking behavior, concern for dependence.  Unclear why Psych is managing Trazodone (antidepressant) and not xanax.  Pt with multiple issues with xanax in past.  Will taper, advised pt that Psychiatrists are allowed to rx benzo's in [place] and if her Psychiatrist feels it is indicated they can start prescribing it.

## 2010-10-11 NOTE — Assessment & Plan Note (Signed)
Well controlled, continue current HCTZ dose.

## 2010-10-11 NOTE — Assessment & Plan Note (Signed)
Pt sleeping poorly, making anxiety worse.  She was recently started on Trazodone, switch from Ambien.  Advised pt to see Psychiatrist very soon.

## 2010-10-11 NOTE — Progress Notes (Signed)
  Subjective:    Patient ID: [name], female    DOB: April 11, 1965, 45 y.o.   MRN: [id number]  HPI  Pt presents for follow up and medication refills.  She states that her anxiety is out of control.  She has run out of her xanax, and her children are hard to manage and she is having trouble with her husband and she needs xanax 3-4 x/day to cope.  She states that her psychiatrist cannot prescribe xanax because it is illegal for any psychiatrist to write for benzodiazepines in the state of [place].  She is insistent about this.  They have her on Trazodone for her anxiety and have stopped her other medications.  Pt is a recovering alcoholic but states over and over again that she is not addicted to xanax.   HTN- No side effects, no chest pain, palpitations.  COPD- pt complaining that they took her oxygen away.  She admits to missing a few doses of her Advair.  She is still smoking 1/2 ppd.  She feels short of breath sometimes, but says it happens when she is anxious too.   Review of Systems + for pain in neck, otherwise stated in HPI.     Objective:   Physical Exam BP 117/85  Pulse 70  Temp(Src) 98 F (36.7 C) (Oral)  Wt 193 lb (87.544 kg) General appearance: mild distress, moderately obese and poor eye contact, tearful.  Eyes: conjunctivae/corneas clear. PERRL, EOM's intact. Fundi benign. Lungs: wheezes diffusely.  Heart: regular rate and rhythm, S1, S2 normal, no murmur, click, rub or gallop Abdomen: soft, non-tender; bowel sounds normal; no masses,  no organomegaly Extremities: extremities normal, atraumatic, no cyanosis or edema Pulses: 2+ and symmetric Skin: Skin color, texture, turgor normal. No rashes or lesions Psy: Pt tearful, poor concentration, poor eye contact.  Denies SI/HI.  Thought content normal, pt does not appear to be responding to internal stimuli. Judgement is poor.        Assessment & Plan:  ANXIETY DISORDER, GENERALIZED Advised pt see psychiatry soon. Pt  with drug-seeking behavior, concern for dependence.  Unclear why Psych is managing Trazodone (antidepressant) and not xanax.  Pt with multiple issues with xanax in past.  Will taper, advised pt that Psychiatrists are allowed to rx benzo's in [place] and if her Psychiatrist feels it is indicated they can start prescribing it.    COPD Pt with some wheezing, but admits to missing maintenance meds.  Emphasized importance of taking every day.   INSOMNIA Pt sleeping poorly, making anxiety worse.  She was recently started on Trazodone, switch from Ambien.  Advised pt to see Psychiatrist very soon.    HYPERTENSION, BENIGN ESSENTIAL Well controlled, continue current HCTZ dose.

## 2010-10-11 NOTE — Assessment & Plan Note (Signed)
Pt with some wheezing, but admits to missing maintenance meds.  Emphasized importance of taking every day.

## 2010-12-04 LAB — POCT I-STAT 3, ART BLOOD GAS (G3+)
Acid-Base Excess: 2
Bicarbonate: 25.8 — ABNORMAL HIGH
O2 Saturation: 93
Patient temperature: 98.9
TCO2: 27
pO2, Arterial: 66 — ABNORMAL LOW

## 2010-12-04 LAB — DIFFERENTIAL
Basophils Absolute: 0.2 — ABNORMAL HIGH
Eosinophils Absolute: 0
Eosinophils Absolute: 0.1
Eosinophils Relative: 0
Eosinophils Relative: 1
Lymphocytes Relative: 9 — ABNORMAL LOW
Lymphs Abs: 1.3
Lymphs Abs: 2.8
Monocytes Absolute: 0.5
Monocytes Relative: 4
Neutrophils Relative %: 80 — ABNORMAL HIGH

## 2010-12-04 LAB — CBC
HCT: 34.7 — ABNORMAL LOW
HCT: 35.4 — ABNORMAL LOW
HCT: 36.8
Hemoglobin: 11.9 — ABNORMAL LOW
MCHC: 33.5
MCHC: 34.3
MCV: 82.8
MCV: 82.8
Platelets: 309
Platelets: 339
Platelets: 383
RBC: 4.44
RDW: 14.5
RDW: 14.8
WBC: 18.8 — ABNORMAL HIGH
WBC: 26.3 — ABNORMAL HIGH

## 2010-12-04 LAB — COMPREHENSIVE METABOLIC PANEL
ALT: 14
AST: 16
Albumin: 3.5
CO2: 29
Calcium: 8.6
GFR calc Af Amer: >60
GFR calc non Af Amer: >60
Sodium: 135
Total Protein: 7.3

## 2010-12-04 LAB — BASIC METABOLIC PANEL
BUN: 20
CO2: 30
Chloride: 102
Creatinine, Ser: 0.94
Potassium: 4

## 2010-12-11 LAB — COMPREHENSIVE METABOLIC PANEL
ALT: 12
AST: 16
Albumin: 3.5
Alkaline Phosphatase: 54
CO2: 30
Chloride: 104
GFR calc Af Amer: >60
GFR calc non Af Amer: >60
Potassium: 3.4 — ABNORMAL LOW
Sodium: 141
Total Bilirubin: 0.9

## 2010-12-11 LAB — BLOOD GAS, ARTERIAL
Acid-Base Excess: 1.9
Bicarbonate: 26.1 — ABNORMAL HIGH
O2 Saturation: 93.8
Patient temperature: 98.6
TCO2: 27.4
pH, Arterial: 7.406 — ABNORMAL HIGH

## 2010-12-11 LAB — CBC
HCT: 36.2
Hemoglobin: 12.1
MCV: 80.7
Platelets: 292
RBC: 4.39
RDW: 13.7
WBC: 9.2

## 2010-12-11 LAB — BASIC METABOLIC PANEL
Calcium: 9.2
Potassium: 4.1

## 2011-02-18 ENCOUNTER — Other Ambulatory Visit: Payer: Self-pay | Admitting: Family Medicine

## 2011-02-18 MED ORDER — FLUTICASONE-SALMETEROL 500-50 MCG/DOSE IN AEPB: 1.0000 | INHALATION_SPRAY | Freq: Two times a day (BID) | RESPIRATORY_TRACT | Status: DC

## 2011-04-15 ENCOUNTER — Other Ambulatory Visit: Payer: Self-pay | Admitting: Family Medicine

## 2011-05-21 ENCOUNTER — Other Ambulatory Visit: Payer: Self-pay | Admitting: Family Medicine

## 2011-05-21 NOTE — Telephone Encounter (Signed)
Refill request

## 2011-06-19 ENCOUNTER — Other Ambulatory Visit: Payer: Self-pay | Admitting: Family Medicine

## 2011-06-21 ENCOUNTER — Other Ambulatory Visit: Payer: Self-pay | Admitting: Family Medicine

## 2011-06-22 ENCOUNTER — Other Ambulatory Visit: Payer: Self-pay | Admitting: Family Medicine

## 2011-07-24 ENCOUNTER — Other Ambulatory Visit: Payer: Self-pay | Admitting: Family Medicine

## 2011-09-30 ENCOUNTER — Other Ambulatory Visit: Payer: Self-pay | Admitting: Family Medicine

## 2011-10-10 ENCOUNTER — Other Ambulatory Visit: Payer: Self-pay | Admitting: Family Medicine

## 2011-10-21 ENCOUNTER — Ambulatory Visit: Payer: Medicare Other | Admitting: Family Medicine

## 2011-10-31 ENCOUNTER — Ambulatory Visit: Payer: Medicare Other | Admitting: Family Medicine

## 2011-11-12 ENCOUNTER — Encounter: Payer: Self-pay | Admitting: Family Medicine

## 2011-11-12 ENCOUNTER — Ambulatory Visit (INDEPENDENT_AMBULATORY_CARE_PROVIDER_SITE_OTHER): Payer: Medicare Other | Admitting: Family Medicine

## 2011-11-12 VITALS — BP 135/81 | HR 98 | Temp 98.6°F | Ht 61.0 in | Wt 233.0 lb

## 2011-11-12 DIAGNOSIS — J449 Chronic obstructive pulmonary disease, unspecified: Secondary | ICD-10-CM

## 2011-11-12 DIAGNOSIS — J4489 Other specified chronic obstructive pulmonary disease: Secondary | ICD-10-CM

## 2011-11-12 DIAGNOSIS — F339 Major depressive disorder, recurrent, unspecified: Secondary | ICD-10-CM

## 2011-11-12 DIAGNOSIS — F411 Generalized anxiety disorder: Secondary | ICD-10-CM

## 2011-11-12 DIAGNOSIS — F17200 Nicotine dependence, unspecified, uncomplicated: Secondary | ICD-10-CM

## 2011-11-12 MED ORDER — QUETIAPINE FUMARATE 200 MG PO TABS: 200.0000 mg | ORAL_TABLET | Freq: Every day | ORAL | Status: DC

## 2011-11-12 MED ORDER — LAMOTRIGINE 150 MG PO TABS: 150.0000 mg | ORAL_TABLET | Freq: Every day | ORAL | Status: DC

## 2011-11-12 MED ORDER — HYDROCHLOROTHIAZIDE 25 MG PO TABS: 25.0000 mg | ORAL_TABLET | Freq: Every day | ORAL | Status: DC

## 2011-11-12 MED ORDER — FLUTICASONE-SALMETEROL 500-50 MCG/DOSE IN AEPB: 1.0000 | INHALATION_SPRAY | Freq: Two times a day (BID) | RESPIRATORY_TRACT | Status: DC

## 2011-11-12 MED ORDER — ALBUTEROL SULFATE (2.5 MG/3ML) 0.083% IN NEBU: 2.5000 mg | INHALATION_SOLUTION | RESPIRATORY_TRACT | Status: DC | PRN

## 2011-11-12 MED ORDER — TIOTROPIUM BROMIDE MONOHYDRATE 18 MCG IN CAPS: 18.0000 ug | ORAL_CAPSULE | Freq: Every day | RESPIRATORY_TRACT | Status: DC

## 2011-11-12 MED ORDER — ALBUTEROL SULFATE HFA 108 (90 BASE) MCG/ACT IN AERS: 2.0000 | INHALATION_SPRAY | Freq: Four times a day (QID) | RESPIRATORY_TRACT | Status: DC

## 2011-11-12 NOTE — Assessment & Plan Note (Signed)
I do not have records from her prior psychiatrist, but given choice of medications, I suspect they feel patient has bipolar spectrum depression.  I will refill her lamictal and seroquel for one month, and have asked her to re-establish care at [facility].  I offered referrals, but she declines saying she does not need one.

## 2011-11-12 NOTE — Patient Instructions (Addendum)
It was good to see you.  I have sent prescriptions for Advair, Albuterol, HCTZ, Spiriva, Lamictal, and Seroquel to your pharmacy.    If your wheezing gets worse, please call the office to come in for an appointment.   Please think hard about quitting smoking, and if and when you are ready we have resources here in our office for Smoking Cessation.

## 2011-11-12 NOTE — Progress Notes (Signed)
  Subjective:    Patient ID: [name], female    DOB: Jan 28, 1966, 46 y.o.   MRN: [id number]  HPI  [name] comes in to re-establish care after being away from our office for >12 months.    COPD- she has been having some intermittent wheezing lately.  She has run out of albuterol, but still has Advair and Spiriva that she is taking.  She has a chronic cough, but is not coughing anything up.  She denies fevers/chills.  She does associate some of her feelings of shortness of breath with her anxiety.   Tobacco use- is smoking at least 1 ppd, some days more.  Wants to quit but has failed many times.  She is under a lot of stress lately and is not sure now is a good time.   Anxiety- is very anxious lately because of her husband's health problems and her sons' mental health and school problems.  She is a recovered alcoholic, but has been taking Xanax for her anxiety.   Depression- feeling sad/lonely, hopeless, but no SI/HI.  She has been on Lamictal and Seroquel per [facility] in the past, but has run out.  She is planning on re-establishing care at [facility], and is also trying to find a therapist in [place].   Past Medical History  Diagnosis Date  . COPD (chronic obstructive pulmonary disease)   . Hypertension   . Anxiety   . Depression   . History of alcoholism    History  Substance Use Topics  . Smoking status: Current Everyday Smoker -- 1.0 packs/day    Types: Cigarettes  . Smokeless tobacco: Never Used  . Alcohol Use: No     History of Alcoholism   Review of Systems See HPI    Objective:   Physical Exam BP 135/81  Pulse 98  Temp 98.6 F (37 C) (Oral)  Ht 5\' 1"  (1.549 m)  Wt 233 lb (105.688 kg)  BMI 44.02 kg/m2  LMP 10/22/2011 Pulse Ox 98% RA General appearance: alert, cooperative and tearful Neck: no adenopathy, no JVD, supple, symmetrical, trachea midline and thyroid not enlarged, symmetric, no tenderness/mass/nodules Lungs: clear to auscultation bilaterally and no  Wheezes. Heart: regular rate and rhythm, S1, S2 normal, no murmur, click, rub or gallop Extremities: extremities normal, atraumatic, no cyanosis or edema       Assessment & Plan:

## 2011-11-12 NOTE — Assessment & Plan Note (Signed)
Given history of alcohol abuse and medication seeking behavior, I do not feel this patient is safe to be on Benzodiazapine's.  I have discussed this with her in the past and re-iterated it today.  I also told her that I feel that if psychiatry is writing for mood stabilizer medications, they should be making decisions about anxiety medications.  She voices understanding.

## 2011-11-12 NOTE — Assessment & Plan Note (Signed)
Tobacco use increased with recent stressors- encouraged cessation, let her know we have resources when she is ready.

## 2011-11-12 NOTE — Assessment & Plan Note (Signed)
No acute exacerbation currently- will refill Albuterol, Spiriva, Advair.  Encouraged smoking cessation.

## 2011-11-18 ENCOUNTER — Telehealth: Payer: Self-pay | Admitting: Family Medicine

## 2011-11-18 NOTE — Telephone Encounter (Signed)
Asking for a rx to be called in to [facility] in [place] for prednisone.  Need this due to having exacerbation of her COPD.  Do not want to go to hospital for condition.  Please send asap

## 2011-11-18 NOTE — Telephone Encounter (Signed)
Called patient back on home number . Not cell # listed .  Patient advised of message and she cannot come here today   and cannot go to ED because her husband has poor eyesight and she has two children she has to look after .  Again advised MD recommends she be seen today .

## 2011-11-18 NOTE — Telephone Encounter (Signed)
Attempted call  back, no answer but voicemail did come on.  Again advised to come in at 1:30 today or call 911 and go to hospital for treatment.

## 2011-11-18 NOTE — Telephone Encounter (Signed)
Patient has a diagnosis of COPD.  Began coughing yesterday and says that it is a COPD exacerbation but also admits that she was around her son who has been sick with a cold and cough.  She's not even able to talk on the phone without coughing and losing her breath.  She's asking for Prednisone. I explained that this med was not on her profile therefore we would need to see her before we could prescribe it.  Told her that I could work her in at 1:30.  She says that she does not have a way to get here.  Based on how she sounded I then advised that she call 911 to go to the ED.  Patient refuses.  Told her I would consult with one of our doctors and get back with her.  Spoke with Dr. McDiarmid who agreed that she either needs to be seen or go to the ED.  Attempted to call her back but she did not answer.  Left her a message with the advice to call 911 or find a way to get to our clinic.  Will attempt to call her back in a few minutes.

## 2011-11-26 ENCOUNTER — Other Ambulatory Visit: Payer: Self-pay | Admitting: Family Medicine

## 2011-11-26 MED ORDER — ALBUTEROL SULFATE HFA 108 (90 BASE) MCG/ACT IN AERS: 2.0000 | INHALATION_SPRAY | Freq: Four times a day (QID) | RESPIRATORY_TRACT | Status: DC

## 2011-12-15 ENCOUNTER — Other Ambulatory Visit: Payer: Self-pay | Admitting: Family Medicine

## 2012-01-20 ENCOUNTER — Other Ambulatory Visit: Payer: Self-pay | Admitting: Family Medicine

## 2012-01-21 ENCOUNTER — Other Ambulatory Visit: Payer: Self-pay | Admitting: *Deleted

## 2012-01-21 MED ORDER — TIOTROPIUM BROMIDE MONOHYDRATE 18 MCG IN CAPS: 18.0000 ug | ORAL_CAPSULE | Freq: Every day | RESPIRATORY_TRACT | Status: DC

## 2012-02-02 ENCOUNTER — Ambulatory Visit (INDEPENDENT_AMBULATORY_CARE_PROVIDER_SITE_OTHER): Payer: Medicare Other | Admitting: Family Medicine

## 2012-02-02 ENCOUNTER — Encounter: Payer: Self-pay | Admitting: Family Medicine

## 2012-02-02 VITALS — BP 136/85 | HR 84 | Temp 98.1°F | Ht 61.0 in | Wt 228.0 lb

## 2012-02-02 DIAGNOSIS — J441 Chronic obstructive pulmonary disease with (acute) exacerbation: Secondary | ICD-10-CM | POA: Insufficient documentation

## 2012-02-02 MED ORDER — MONTELUKAST SODIUM 10 MG PO TABS: 10.0000 mg | ORAL_TABLET | Freq: Every day | ORAL | Status: DC

## 2012-02-02 MED ORDER — LORATADINE 10 MG PO TABS: 10.0000 mg | ORAL_TABLET | Freq: Every day | ORAL | Status: DC

## 2012-02-02 MED ORDER — PREDNISONE 20 MG PO TABS: 40.0000 mg | ORAL_TABLET | Freq: Every day | ORAL | Status: DC

## 2012-02-02 NOTE — Progress Notes (Signed)
  Subjective:    Patient ID: [name], female    DOB: [birth year], 46 y.o.   MRN: [id number]  HPI  Shortness of breath  For last few days increased shortness of breath and dry cough and wheezing.  Feels like COPD exacerabations has had in past.  No fever or sputum or chest pain or edema PMH - last hospitalized 2010. Needs to use prednisone bursts several times a year  Review of Symptoms - see HPI  PMH - Smoking status noted.     Review of Systems     Objective:   Physical Exam  Alert no acute distress  Pulse ox noted Frequent cough with deep breathing or long conversation Heart - Regular rate and rhythm.  No murmurs, gallops or rubs.    Lungs - mild diffuse wheeze without rales or focal sounds No edema of legs Mucous membranes moist      Assessment & Plan:

## 2012-02-02 NOTE — Patient Instructions (Addendum)
Finish all the prednisone   If you are not feeling better after 3 days then call  If you become very shortness of breath go to ER  If your sputum becomes very yellow or you have fever then call us  Get you flu shot once you are off prednisone for 1 week

## 2012-02-02 NOTE — Assessment & Plan Note (Signed)
Moderate.  Will treat with prednisone burst.  She relates is using all her chronic medications regularly.  No signs of infection so will not use antibiotics but cautioned to contact us if these develop

## 2012-03-02 ENCOUNTER — Other Ambulatory Visit: Payer: Self-pay | Admitting: Family Medicine

## 2012-03-04 ENCOUNTER — Encounter: Payer: Self-pay | Admitting: *Deleted

## 2012-03-04 ENCOUNTER — Other Ambulatory Visit: Payer: Self-pay | Admitting: Family Medicine

## 2012-03-05 MED ORDER — FLUTICASONE PROPIONATE 50 MCG/ACT NA SUSP: 2.0000 | Freq: Every day | NASAL | Status: DC

## 2012-03-19 NOTE — Telephone Encounter (Signed)
This encounter was created in error - please disregard.

## 2012-04-08 ENCOUNTER — Other Ambulatory Visit: Payer: Self-pay | Admitting: Family Medicine

## 2012-04-30 ENCOUNTER — Telehealth: Payer: Self-pay | Admitting: Family Medicine

## 2012-04-30 NOTE — Telephone Encounter (Signed)
Pharmacy needs a diagnosis code on the Albuterol rx. Unable to get it filled until this is taken care of.  Patient at pharmacy now.

## 2012-04-30 NOTE — Telephone Encounter (Signed)
Returned call to patient and she uses nebulizer for COPD (Dx code--496).  Pharmacy has already taken care of issue and filled Albuterol Rx.  [facility] pharmacy and also informed of Dx code.  Gaylene Brooks, RN

## 2012-05-18 ENCOUNTER — Other Ambulatory Visit: Payer: Self-pay | Admitting: Family Medicine

## 2012-06-09 ENCOUNTER — Encounter: Payer: Self-pay | Admitting: Family Medicine

## 2012-06-09 ENCOUNTER — Ambulatory Visit (INDEPENDENT_AMBULATORY_CARE_PROVIDER_SITE_OTHER): Payer: Medicare Other | Admitting: Family Medicine

## 2012-06-09 VITALS — BP 128/85 | HR 67 | Temp 97.7°F | Ht 61.0 in | Wt 220.5 lb

## 2012-06-09 DIAGNOSIS — J309 Allergic rhinitis, unspecified: Secondary | ICD-10-CM

## 2012-06-09 DIAGNOSIS — J449 Chronic obstructive pulmonary disease, unspecified: Secondary | ICD-10-CM

## 2012-06-09 DIAGNOSIS — J302 Other seasonal allergic rhinitis: Secondary | ICD-10-CM | POA: Insufficient documentation

## 2012-06-09 DIAGNOSIS — F17200 Nicotine dependence, unspecified, uncomplicated: Secondary | ICD-10-CM

## 2012-06-09 NOTE — Assessment & Plan Note (Signed)
Contemplative- declines smoking cessation resources or health coach righ tnow.

## 2012-06-09 NOTE — Patient Instructions (Signed)
I am sorry you are going through a rough time- please let me know if you decide you want to talk more about quitting smoking.    Our office will contact you about your referral back to Dr. Roxy Cedar office.

## 2012-06-09 NOTE — Progress Notes (Signed)
  Subjective:    Patient ID: Dyanne Iha, female    DOB: [birth year], 47 y.o.   MRN: [id number]  HPI:  Caroline comes in for follow up.    Asthma- has worsened lately with the weather change.  She is taking Spiriva, Advair, and albuterol as needed.  She has been having some coughing and wheezing.  Albuterol helps.  Wants to see Dr. Maple Hudson (asthma and allergy) again, needs re-referral.   Allergies: Taking loratadine and Flonase and Singulair.  Worsening nasal congestion and eye itching.  Again would like re-referal to allergist.   Tobacco abuse: Still smoking 1 ppd.  Says she wants to quit but life is too stressful right now.   Past Medical History  Diagnosis Date  . COPD (chronic obstructive pulmonary disease)   . Hypertension   . Anxiety   . Depression   . History of alcoholism     History  Substance Use Topics  . Smoking status: Current Every Day Smoker -- 1.00 packs/day    Types: Cigarettes  . Smokeless tobacco: Never Used  . Alcohol Use: No     Comment: History of Alcoholism    Family History  Problem Relation Age of Onset  . Hypertension Mother   . Hypertension Father      ROS Pertinent items in HPI    Objective:  Physical Exam:  BP 128/85  Pulse 67  Temp(Src) 97.7 F (36.5 C) (Oral)  Ht 5\' 1"  (1.549 m)  Wt 220 lb 8 oz (100.018 kg)  BMI 41.68 kg/m2  LMP 05/31/2012 General appearance: alert, cooperative and no distress Head: Normocephalic, without obvious abnormality, atraumatic Lungs: good air movement, minimal wheezes Heart: regular rate and rhythm, S1, S2 normal, no murmur, click, rub or gallop Pulses: 2+ and symmetric       Assessment & Plan:

## 2012-06-09 NOTE — Assessment & Plan Note (Signed)
Re-refer to Dr. Maple Hudson.

## 2012-06-09 NOTE — Assessment & Plan Note (Signed)
Re-refer to Dr. Roxy Cedar office.

## 2012-07-29 ENCOUNTER — Ambulatory Visit (INDEPENDENT_AMBULATORY_CARE_PROVIDER_SITE_OTHER): Payer: Medicare Other | Admitting: Internal Medicine

## 2012-07-29 ENCOUNTER — Ambulatory Visit (INDEPENDENT_AMBULATORY_CARE_PROVIDER_SITE_OTHER)
Admission: RE | Admit: 2012-07-29 | Discharge: 2012-07-29 | Disposition: A | Discharge status: Home / Self Care | Payer: Medicare Other | Source: Ambulatory Visit | Attending: Internal Medicine | Admitting: Internal Medicine

## 2012-07-29 ENCOUNTER — Encounter: Payer: Self-pay | Admitting: Internal Medicine

## 2012-07-29 VITALS — BP 130/82 | HR 79 | Ht 61.0 in | Wt 229.6 lb

## 2012-07-29 DIAGNOSIS — J4489 Other specified chronic obstructive pulmonary disease: Secondary | ICD-10-CM

## 2012-07-29 DIAGNOSIS — J449 Chronic obstructive pulmonary disease, unspecified: Secondary | ICD-10-CM

## 2012-07-29 DIAGNOSIS — J309 Allergic rhinitis, unspecified: Secondary | ICD-10-CM

## 2012-07-29 DIAGNOSIS — J302 Other seasonal allergic rhinitis: Secondary | ICD-10-CM

## 2012-07-29 DIAGNOSIS — F17200 Nicotine dependence, unspecified, uncomplicated: Secondary | ICD-10-CM

## 2012-07-29 MED ORDER — AZELASTINE-FLUTICASONE 137-50 MCG/ACT NA SUSP: 2.0000 | Freq: Every day | NASAL | Status: DC

## 2012-07-29 MED ORDER — METHYLPREDNISOLONE ACETATE 80 MG/ML IJ SUSP
80.0000 mg | Freq: Once | INTRAMUSCULAR | Status: AC
  Administered 2012-07-29: 80 mg via INTRAMUSCULAR

## 2012-07-29 NOTE — Patient Instructions (Addendum)
Order- CXR   Dx COPD  Sample Dymista nasal spray 1-2 puffs each nostril once daily at bedtime  Schedule for allergy skin testing     Antihistamines block the skin tests, so for 3 days before testing- no allergy medicines, cold or cough meds, otc sleep meds   Depo 80

## 2012-07-29 NOTE — Progress Notes (Signed)
07/29/12- 46 yoF 1 ppd smoker referred courtesy of Dr Lula Olszewski for allergy consultation. Husband is a patient here ( blind and on dialysis). Son is also being seen today. She is a former patient, last here in 2004 help with asthma/COPD and allergic rhinitis. She was on allergy vaccine between 2005 and 2010, until she moved away. She recalls allergy vaccine helping her considerably at that time. In the last 2 years she describes increasing sneeze, nasal congestion such that she has to sleep sitting up. She is interested in restarting allergy vaccine. History of sinus infection at least one episode of pneumonia. She has glaucoma well controlled despite Spiriva. Respiratory triggers include seasonal pollens. Environment: House with basement, 2 dogs, no mold. Smoking one pack per day. 2 sons, one autistic.  Prior to Admission medications   Medication Sig Start Date End Date Taking? Authorizing Provider  ADVAIR DISKUS 500-50 MCG/DOSE AEPB INHALE 1 PUFF INTO THE LUNGS TWICE DAILY 03/02/12  Yes Ardyth Gal, MD  albuterol (PROAIR HFA) 108 (90 BASE) MCG/ACT inhaler Inhale 2 puffs into the lungs 4 (four) times daily. 11/26/11  Yes Ardyth Gal, MD  albuterol (PROVENTIL) (2.5 MG/3ML) 0.083% nebulizer solution USE 1 VIAL IN NEBULIZER EVERY 4 HOURS AS NEEDED 05/21/11  Yes Ardyth Gal, MD  fluticasone [facility]) 50 MCG/ACT nasal spray Place 2 sprays into the nose daily. 03/05/12  Yes Ardyth Gal, MD  hydrochlorothiazide (HYDRODIURIL) 25 MG tablet TAKE 1 TABLET BY MOUTH EVERY DAY 03/02/12  Yes Ardyth Gal, MD  ibuprofen (ADVIL,MOTRIN) 800 MG tablet Take 800 mg by mouth every 8 (eight) hours as needed for pain.   Yes Historical Provider, MD  lamoTRIgine (LAMICTAL) 150 MG tablet Take 1 tablet (150 mg total) by mouth daily. 11/12/11 11/11/12 Yes Ardyth Gal, MD  loratadine (CLARITIN) 10 MG tablet Take 1 tablet (10 mg total) by mouth daily. 02/02/12  Yes Carney Living, MD   montelukast (SINGULAIR) 10 MG tablet TAKE 1 TABLET BY MOUTH EVERY NIGHT AT BEDTIME 03/02/12  Yes Ardyth Gal, MD  omeprazole (PRILOSEC) 20 MG capsule TAKE ONE CAPSULE BY MOUTH EVERY DAY 03/02/12  Yes Ardyth Gal, MD  tiotropium (SPIRIVA HANDIHALER) 18 MCG inhalation capsule Place 1 capsule (18 mcg total) into inhaler and inhale daily. 01/21/12  Yes Shelly Rubenstein, MD  ALPRAZolam Prudy Feeler) 0.5 MG tablet Take 1 tablet by mouth 4 (four) times daily as needed. 07/12/12   Historical Provider, MD  Azelastine-Fluticasone (DYMISTA) 137-50 MCG/ACT SUSP Place 2 sprays into both nostrils at bedtime. 07/29/12   Waymon Budge, MD  EPINEPHrine (EPIPEN) 0.3 MG/0.3ML DEVI Inject 0.3 mg into the muscle once. For severe allergic reaction     Historical Provider, MD  oxyCODONE (ROXICODONE) 15 MG immediate release tablet Take 1 tablet by mouth every 6 (six) hours as needed. 07/16/12   Historical Provider, MD  TRAVATAN Z 0.004 % SOLN ophthalmic solution Place 1 drop into both eyes at bedtime. 05/12/12   Historical Provider, MD   Past Medical History  Diagnosis Date  . COPD (chronic obstructive pulmonary disease)   . Hypertension   . Anxiety   . Depression   . History of alcoholism    History reviewed. No pertinent past surgical history. Family History  Problem Relation Age of Onset  . Hypertension Mother   . Hypertension Father    History   Social History  . Marital Status: Married    Spouse Name: N/A    Number of Children: N/A  . Years of Education: N/A  Occupational History  . Not on file.   Social History Main Topics  . Smoking status: Current Every Day Smoker -- 1.00 packs/day    Types: Cigarettes  . Smokeless tobacco: Never Used  . Alcohol Use: No     Comment: History of Alcoholism  . Drug Use: Not on file  . Sexually Active: Not on file   Other Topics Concern  . Not on file   Social History Narrative  . No narrative on file   ROS-see HPI Constitutional:   No-   weight  loss, night sweats, fevers, chills, fatigue, lassitude. HEENT:   No-  headaches, difficulty swallowing, tooth/dental problems, +sore throat,       +  sneezing, itching, ear ache, nasal congestion, post nasal drip,  CV:  No-   chest pain, orthopnea, PND,+ swelling in lower extremities, no-anasarca,                                  dizziness, palpitations Resp: + shortness of breath with exertion or at rest.              +   productive cough,  No non-productive cough,  No- coughing up of blood.              No-   change in color of mucus.  No- wheezing.   Skin: No-   rash or lesions. GI:  + heartburn, indigestion, No-abdominal pain, nausea, vomiting, diarrhea,                 change in bowel habits, loss of appetite GU: No-   dysuria, change in color of urine, no urgency or frequency.  No- flank pain. MS:  + joint pain or swelling.  No- decreased range of motion.  No- back pain. Neuro-     nothing unusual Psych:  No- change in mood or affect. + depression or anxiety.  No memory loss.  OBJ- Physical Exam General- Alert, Oriented, Affect-appropriate, Distress- none acute. + Overweight Skin- rash-none, lesions- none, excoriation- none Lymphadenopathy- none Head- atraumatic            Eyes- Gross vision intact, PERRLA, conjunctivae and secretions clear            Ears- Hearing, canals-normal            Nose- + sneezing while here, no-Septal dev, mucus, polyps, erosion, perforation             Throat- Mallampati III , mucosa clear , drainage- none, tonsils- atrophic Neck- flexible , trachea midline, no stridor , thyroid nl, carotid no bruit Chest - symmetrical excursion , unlabored           Heart/CV- RRR , no murmur , no gallop  , no rub, nl s1 s2                           - JVD- none , edema- none, stasis changes- none, varices- none           Lung- clear to P&A, wheeze- none, cough+ dry , dullness-none, rub- none           Chest wall-  Abd- tender-no, distended-no, bowel sounds-present,  HSM- no Br/ Gen/ Rectal- Not done, not indicated Extrem- cyanosis- none, clubbing, none, atrophy- none, strength- nl Neuro- grossly intact to observation

## 2012-08-06 NOTE — Progress Notes (Signed)
Quick Note:  PT AWARE OF RESULTS. ______ 

## 2012-08-10 NOTE — Assessment & Plan Note (Signed)
I reviewed the interaction of tobacco smoke irritation with her rhinitis and lower respiratory problems and emphasized the availability of support for cessation

## 2012-08-10 NOTE — Assessment & Plan Note (Signed)
Smoking cessation was emphasized. She is not having active problems today with wheeze or chest tightness but does have some dry cough. Plan-chest x-ray

## 2012-08-10 NOTE — Assessment & Plan Note (Signed)
There is likely to be an allergic component to her symptoms, although I emphasized that tobacco exposure is more important. Plan-Depo-Medrol today, sample Dymista nasal spray, return for allergy skin testing.

## 2012-08-11 ENCOUNTER — Encounter: Payer: Self-pay | Admitting: Internal Medicine

## 2012-08-11 ENCOUNTER — Ambulatory Visit (INDEPENDENT_AMBULATORY_CARE_PROVIDER_SITE_OTHER): Payer: Medicare Other | Admitting: Internal Medicine

## 2012-08-11 VITALS — BP 130/82 | HR 107 | Ht 61.0 in | Wt 216.8 lb

## 2012-08-11 DIAGNOSIS — J309 Allergic rhinitis, unspecified: Secondary | ICD-10-CM

## 2012-08-11 DIAGNOSIS — J449 Chronic obstructive pulmonary disease, unspecified: Secondary | ICD-10-CM

## 2012-08-11 DIAGNOSIS — F17200 Nicotine dependence, unspecified, uncomplicated: Secondary | ICD-10-CM

## 2012-08-11 DIAGNOSIS — J302 Other seasonal allergic rhinitis: Secondary | ICD-10-CM

## 2012-08-11 NOTE — Assessment & Plan Note (Signed)
W.e discussed risk and benefit, alternatives and management of allergy vaccine. She has no primary physician. She will drive up here from [facility] for each shot, so she may need to move slowly during the buildup time, just coming up once per week

## 2012-08-11 NOTE — Assessment & Plan Note (Signed)
This is important and we discussed carefully. She began to cry, feeling emotionally unable to try to stop smoking now.

## 2012-08-11 NOTE — Progress Notes (Signed)
07/29/12- 46 yoF 1 ppd smoker referred courtesy of Dr Lula Olszewski for allergy consultation. Husband is a patient here ( blind and on dialysis). Son is also being seen today. She is a former patient, last here in 2004 help with asthma/COPD and allergic rhinitis. She was on allergy vaccine between 2005 and 2010, until she moved away. She recalls allergy vaccine helping her considerably at that time. In the last 2 years she describes increasing sneeze, nasal congestion such that she has to sleep sitting up. She is interested in restarting allergy vaccine. History of sinus infection at least one episode of pneumonia. She has glaucoma well controlled despite Spiriva. Respiratory triggers include seasonal pollens. Environment: House with basement, 2 dogs, no mold. Smoking one pack per day. 2 sons, one autistic.  08/11/12-  46 yoF 1 ppd smoker referred courtesy of Dr Lula Olszewski for allergy consultation. Husband is a patient here ( blind and on dialysis) No antihistamines, OTC cough syrups,or OTC sleep aids. Increased cough, sneeze, eyes watering, wheeze in the last few days off of antihistamines. We discussed her experience with allergy shots between 2005 and 2010.. Medications helped but she felt better on shots and wants to restart. We discussed her smoking. She is not prepared to try to stop now, referring to her very difficult family situation.  Allergy skin test- significant positives for grass weed and tree pollens, dust mite, cockroach. CXR 08/06/12 IMPRESSION:  Mild bibasilar scarring, similar to 2010.  No evidence of acute cardiopulmonary disease.  Original Report Authenticated By: Charline Bills, M.D.  ROS-see HPI Constitutional:   No-   weight loss, night sweats, fevers, chills, fatigue, lassitude. HEENT:   No-  headaches, difficulty swallowing, tooth/dental problems, +sore throat,       +  sneezing, +itching, ear ache, +nasal congestion,+ post nasal drip,  CV:  No-   chest pain, orthopnea,  PND,+ swelling in lower extremities, no-anasarca,                                  dizziness, palpitations Resp: + shortness of breath with exertion or at rest.              +   productive cough,  No non-productive cough,  No- coughing up of blood.              No-   change in color of mucus.  No- wheezing.   Skin: No-   rash or lesions. GI:  + heartburn, indigestion, No-abdominal pain, nausea, vomiting,  GU:  MS:  + joint pain or swelling.   Neuro-     nothing unusual Psych:  No- change in mood or affect. + depression or anxiety.  No memory loss.  OBJ- Physical Exam General- Alert, Oriented, Affect-appropriate, Distress- none acute. + Overweight Skin- rash-none, lesions- none, excoriation- none Lymphadenopathy- none Head- atraumatic            Eyes- Gross vision intact, PERRLA, conjunctivae and secretions clear            Ears- Hearing, canals-normal            Nose- + turbinate edema, no-Septal dev, +mucus bridging, polyps, erosion, perforation             Throat- Mallampati III , mucosa clear , drainage- none, tonsils- atrophic Neck- flexible , trachea midline, no stridor , thyroid nl, carotid no bruit Chest - symmetrical excursion , unlabored  Heart/CV- RRR , no murmur , no gallop  , no rub, nl s1 s2                           - JVD- none , edema- none, stasis changes- none, varices- none           Lung-  wheeze- none, cough+ dry , dullness-none, rub- none           Chest wall-  Abd-  Br/ Gen/ Rectal- Not done, not indicated Extrem- cyanosis- none, clubbing, none, atrophy- none, strength- nl Neuro- grossly intact to observation

## 2012-08-11 NOTE — Patient Instructions (Addendum)
The allergy lab will let you know when your vaccine is ready to start. Since you have to drive from [place], we can build your vaccine once a week instead of twice weekly, if that works best for you. You can talk this over with the allergy lab staff.  Please call as needed

## 2012-08-11 NOTE — Assessment & Plan Note (Signed)
There may be an allergic component, but smoking is much more important. Continue present medications.

## 2012-08-12 ENCOUNTER — Ambulatory Visit (INDEPENDENT_AMBULATORY_CARE_PROVIDER_SITE_OTHER): Payer: Medicare Other

## 2012-08-12 DIAGNOSIS — J309 Allergic rhinitis, unspecified: Secondary | ICD-10-CM

## 2012-08-17 ENCOUNTER — Telehealth: Payer: Self-pay | Admitting: Internal Medicine

## 2012-08-17 NOTE — Telephone Encounter (Signed)
I called [name] to let her know she could start coming for her allergy shots. I gave her your suggestion to come in once a wk. She preceded to tell me she lives in [place] gas is so expensive,my husband is 80% blind, her son is autistic and it's hard for her to leave them for very long.(It's an hour trip .) She would like to continue getting her shots at home. Please advise.

## 2012-08-18 NOTE — Telephone Encounter (Signed)
Called pt. She is coming in tomorrow to be taught,sign wavier,and I'll have Selena Batten or Florentina Addison one send an epi-pen rx. Thanks, [employer]

## 2012-08-18 NOTE — Telephone Encounter (Signed)
On a compassionate basis- We can teach her to give own shots. Epipen Rx, waiver to be signed.

## 2012-08-19 ENCOUNTER — Other Ambulatory Visit: Payer: Self-pay | Admitting: Internal Medicine

## 2012-08-19 ENCOUNTER — Ambulatory Visit (INDEPENDENT_AMBULATORY_CARE_PROVIDER_SITE_OTHER): Payer: Medicare Other

## 2012-08-19 ENCOUNTER — Telehealth: Payer: Self-pay | Admitting: Internal Medicine

## 2012-08-19 DIAGNOSIS — J309 Allergic rhinitis, unspecified: Secondary | ICD-10-CM

## 2012-08-19 MED ORDER — EPINEPHRINE 0.3 MG/0.3ML IJ SOAJ: 0.3000 mg | Freq: Once | INTRAMUSCULAR | Status: DC

## 2012-08-19 NOTE — Telephone Encounter (Signed)
Pt was gave an rx for syringes for her allergy injections per the [place] at [facility] pts insurance will not cover them. Pt has medicare /medicad.Olegario Messier is going to try an get  A hold of medicare and see if she can get this covered. If not pt will have to pay for syringes.

## 2012-08-19 NOTE — Telephone Encounter (Signed)
Pt requesting rx for epi pen . Pt is to start allergy injections at home . rx sent

## 2012-08-23 ENCOUNTER — Encounter: Payer: Self-pay | Admitting: Internal Medicine

## 2012-09-15 ENCOUNTER — Ambulatory Visit (INDEPENDENT_AMBULATORY_CARE_PROVIDER_SITE_OTHER): Payer: Medicare Other

## 2012-09-15 DIAGNOSIS — J309 Allergic rhinitis, unspecified: Secondary | ICD-10-CM

## 2012-10-12 ENCOUNTER — Ambulatory Visit (INDEPENDENT_AMBULATORY_CARE_PROVIDER_SITE_OTHER): Payer: Medicare Other

## 2012-10-12 DIAGNOSIS — J309 Allergic rhinitis, unspecified: Secondary | ICD-10-CM

## 2012-10-13 ENCOUNTER — Encounter: Payer: Self-pay | Admitting: Internal Medicine

## 2012-10-13 ENCOUNTER — Ambulatory Visit: Payer: Medicare Other | Admitting: Internal Medicine

## 2012-10-13 ENCOUNTER — Ambulatory Visit (INDEPENDENT_AMBULATORY_CARE_PROVIDER_SITE_OTHER): Payer: Medicare Other | Admitting: Internal Medicine

## 2012-10-13 VITALS — BP 120/60 | HR 87 | Ht 61.0 in | Wt 204.4 lb

## 2012-10-13 DIAGNOSIS — J302 Other seasonal allergic rhinitis: Secondary | ICD-10-CM

## 2012-10-13 DIAGNOSIS — J309 Allergic rhinitis, unspecified: Secondary | ICD-10-CM

## 2012-10-13 DIAGNOSIS — J449 Chronic obstructive pulmonary disease, unspecified: Secondary | ICD-10-CM

## 2012-10-13 DIAGNOSIS — F17200 Nicotine dependence, unspecified, uncomplicated: Secondary | ICD-10-CM

## 2012-10-13 NOTE — Progress Notes (Signed)
07/29/12- 46 yoF 1 ppd smoker referred courtesy of Dr Lula Olszewski for allergy consultation. Husband is a patient here ( blind and on dialysis). Son is also being seen today. She is a former patient, last here in 2004 help with asthma/COPD and allergic rhinitis. She was on allergy vaccine between 2005 and 2010, until she moved away. She recalls allergy vaccine helping her considerably at that time. In the last 2 years she describes increasing sneeze, nasal congestion such that she has to sleep sitting up. She is interested in restarting allergy vaccine. History of sinus infection at least one episode of pneumonia. She has glaucoma well controlled despite Spiriva. Respiratory triggers include seasonal pollens. Environment: House with basement, 2 dogs, no mold. Smoking one pack per day. 2 sons, one autistic.  08/11/12-  46 yoF 1 ppd smoker referred courtesy of Dr Lula Olszewski for allergy consultation. Husband is a patient here ( blind and on dialysis) No antihistamines, OTC cough syrups,or OTC sleep aids. Increased cough, sneeze, eyes watering, wheeze in the last few days off of antihistamines. We discussed her experience with allergy shots between 2005 and 2010.. Medications helped but she felt better on shots and wants to restart. We discussed her smoking. She is not prepared to try to stop now, referring to her very difficult family situation.  Allergy skin test- significant positives for grass weed and tree pollens, dust mite, cockroach. CXR 08/06/12 IMPRESSION:  Mild bibasilar scarring, similar to 2010.  No evidence of acute cardiopulmonary disease.  Original Report Authenticated By: Charline Bills, M.D.  10/13/12- 46 yoF 1 ppd smoker followed for asthma, copd, seasonal allergic rhinitis   PCP Dr Lula Olszewski Husband is a patient here ( blind and on dialysis) She is building allergy vaccine, now 1:50 G0, with no problems. Breathing okay, used nebulizer only 2 or 3 times in July. Unfortunately she  still smokes and pleads emotional overload from her family. She is defensive about her smoking. Some nasal congestion and drainage  ROS-see HPI Constitutional:   No-   weight loss, night sweats, fevers, chills, fatigue, lassitude. HEENT:   No-  headaches, difficulty swallowing, tooth/dental problems, no-sore throat,       No- sneezing, no-itching, ear ache, +nasal congestion,+ post nasal drip,  CV:  No-   chest pain, orthopnea, PND,+ swelling in lower extremities, no-anasarca,                                  dizziness, palpitations Resp: + shortness of breath with exertion or at rest.              +   productive cough,  No non-productive cough,  No- coughing up of blood.              No-   change in color of mucus.  No- wheezing.   Skin: No-   rash or lesions. GI:  + heartburn, indigestion, No-abdominal pain, nausea, vomiting,  GU:  MS:  + joint pain or swelling.   Neuro-     nothing unusual Psych:  No- change in mood or affect. + depression or anxiety.  No memory loss.  OBJ- Physical Exam General- Alert, Oriented, Affect-appropriate, Distress- none acute. + Overweight Skin- rash-none, lesions- none, excoriation- none Lymphadenopathy- none Head- atraumatic            Eyes- Gross vision intact, PERRLA, conjunctivae and secretions clear  Ears- Hearing, canals-normal            Nose- + turbinate edema, no-Septal dev, +mucus bridging, polyps, erosion, perforation             Throat- Mallampati III , mucosa clear , drainage- none, tonsils- atrophic Neck- flexible , trachea midline, no stridor , thyroid nl, carotid no bruit Chest - symmetrical excursion , unlabored           Heart/CV- RRR , no murmur , no gallop  , no rub, nl s1 s2                           - JVD- none , edema- none, stasis changes- none, varices- none           Lung-  wheeze- none, cough+ dry , dullness-none, rub- none           Chest wall-  Abd-  Br/ Gen/ Rectal- Not done, not indicated Extrem- cyanosis-  none, clubbing, none, atrophy- none, strength- nl Neuro- grossly intact to observation

## 2012-10-13 NOTE — Patient Instructions (Addendum)
We can continue building allergy vaccine as directed.   Continue the Flonase/ fluticasone nasal spray  Consider trying otc Allegra/ fexofenadine to see if it works any better for you than the loratadine/ Claritin  We know it will help your lungs and nose when you can slow down and stop your smoking

## 2012-10-25 NOTE — Assessment & Plan Note (Signed)
Allergy vaccine can address the allergic component of her rhinitis and lower respiratory problems. Real improvement is not expected unless she quit smoking.

## 2012-10-25 NOTE — Assessment & Plan Note (Signed)
Tobacco is more important than allergy for her respiratory problems. She has the medications she needs but significant improvement is unlikely without smoking cessation

## 2012-10-25 NOTE — Assessment & Plan Note (Signed)
I have offered support. She clearly does not want to discuss smoking cessation at this time

## 2012-11-02 ENCOUNTER — Telehealth: Payer: Self-pay | Admitting: *Deleted

## 2012-11-02 NOTE — Telephone Encounter (Signed)
Form placed in your box for albuterol pharmacy orders. Please complete and fax. Wyatt Haste, RN-BSN

## 2012-11-12 ENCOUNTER — Ambulatory Visit (INDEPENDENT_AMBULATORY_CARE_PROVIDER_SITE_OTHER): Payer: Medicare Other

## 2012-11-12 DIAGNOSIS — J309 Allergic rhinitis, unspecified: Secondary | ICD-10-CM

## 2012-11-19 ENCOUNTER — Other Ambulatory Visit: Payer: Self-pay | Admitting: Family Medicine

## 2012-11-19 MED ORDER — OMEPRAZOLE 20 MG PO CPDR: DELAYED_RELEASE_CAPSULE | ORAL | Status: DC

## 2012-11-19 MED ORDER — ALBUTEROL SULFATE HFA 108 (90 BASE) MCG/ACT IN AERS: 2.0000 | INHALATION_SPRAY | Freq: Four times a day (QID) | RESPIRATORY_TRACT | Status: DC

## 2012-11-19 MED ORDER — TIOTROPIUM BROMIDE MONOHYDRATE 18 MCG IN CAPS: 18.0000 ug | ORAL_CAPSULE | Freq: Every day | RESPIRATORY_TRACT | Status: DC

## 2012-11-19 MED ORDER — HYDROCHLOROTHIAZIDE 25 MG PO TABS: ORAL_TABLET | ORAL | Status: DC

## 2012-11-19 NOTE — Progress Notes (Signed)
Pt has appt 11/26/12. Fleeger, [name]

## 2012-11-26 ENCOUNTER — Encounter: Payer: Self-pay | Admitting: Family Medicine

## 2012-11-26 ENCOUNTER — Ambulatory Visit (INDEPENDENT_AMBULATORY_CARE_PROVIDER_SITE_OTHER): Payer: Medicare Other | Admitting: Family Medicine

## 2012-11-26 VITALS — BP 112/78 | HR 90 | Temp 98.1°F | Ht 61.0 in | Wt 203.0 lb

## 2012-11-26 DIAGNOSIS — Z23 Encounter for immunization: Secondary | ICD-10-CM

## 2012-11-26 DIAGNOSIS — K439 Ventral hernia without obstruction or gangrene: Secondary | ICD-10-CM | POA: Insufficient documentation

## 2012-11-26 DIAGNOSIS — Z1231 Encounter for screening mammogram for malignant neoplasm of breast: Secondary | ICD-10-CM

## 2012-11-26 DIAGNOSIS — F339 Major depressive disorder, recurrent, unspecified: Secondary | ICD-10-CM

## 2012-11-26 NOTE — Assessment & Plan Note (Signed)
Patient with recent life events leading to depressive episode. She is followed by [facility] psychiatrist and has follow-up scheduled with them on 9/29. Advised that she keep this appointment and if need be she should contact them for an earlier appointment. She denies SI and HI. She may benefit from treatment of anxiety component, though she is to discuss this with her psychiatrist. Algis Downs that someone from our office is available to talk if needed.

## 2012-11-26 NOTE — Patient Instructions (Addendum)
Nice to meet you. Please follow-up with your psychiatrist on the 29th. It is very important to discuss your anxiety with him. If you feel your anxiety level is too great prior to seeing them, please give your psychiatrist a call. For your hernias I am going to refer you to a general surgeon for evaluation. Some one will call you to set up the appointment.  Depression, Adult Depression is feeling sad, low, down in the dumps, blue, gloomy, or empty. In general, there are two kinds of depression:  Normal sadness or grief. This can happen after something upsetting. It often goes away on its own within 2 weeks. After losing a loved one (bereavement), normal sadness and grief may last longer than two weeks. It usually gets better with time.  Clinical depression. This kind lasts longer than normal sadness or grief. It keeps you from doing the things you normally do in life. It is often hard to function at home, work, or at school. It may affect your relationships with others. Treatment is often needed. GET HELP RIGHT AWAY IF:  You have thoughts about hurting yourself or others.  You lose touch with reality (psychotic symptoms). You may:  See or hear things that are not real.  Have untrue beliefs about your life or people around you.  Your medicine is giving you problems. MAKE SURE YOU:  Understand these instructions.  Will watch your condition.  Will get help right away if you are not doing well or get worse. Document Released: 03/29/2010 Document Revised: 11/19/2011 Document Reviewed: 03/29/2010 [facility] Patient Information 2014 [place], [place].

## 2012-11-26 NOTE — Progress Notes (Signed)
Patient ID: [name], female   DOB: Jun 18, 1965, 47 y.o.   MRN: [id number] [name] is a 47 y.o. female who presents today for PE, though discussion was regarding depression and anxiety as well as hernias.  Depression: has had trouble with depression for many years. Is followed by a psychiatrist and has an appointment on 9/29. Notes she is on seroquel and lamictal. Has had a stressful time recently with her husband going blind and being on dialysis, as well as having a child just diagnosed with autism. She feels as though she spends all her time caring for her children and husband and does not have anything to enjoy in life. She denies having any friends or fun activities she can partake in. States her anxiety is through the roof and states that she was previously on xanax for her anxiety and she was "only" taking 3 times a day, but Dr. Lula Olszewski took her off of this. She states she has had some thoughts of feeling as though she would be better off if she was not here anymore, though denies SI or HI.  Hernia: for the past year has had midline hernias. The protrude when she is sitting up. Are uncomfortable when sitting up. Painful if she presses on them. They have not gotten stuck. She has had a ventral hernia repaired previously.  PHQ9 11/28, moderate depression  Past Medical History  Diagnosis Date  . COPD (chronic obstructive pulmonary disease)   . Hypertension   . Anxiety   . Depression   . History of alcoholism     History  Smoking status  . Current Every Day Smoker -- 1.00 packs/day  . Types: Cigarettes  Smokeless tobacco  . Never Used    Family History  Problem Relation Age of Onset  . Hypertension Mother   . Hypertension Father     Current Outpatient Prescriptions on File Prior to Visit  Medication Sig Dispense Refill  . ADVAIR DISKUS 500-50 MCG/DOSE AEPB INHALE 1 PUFF INTO THE LUNGS TWICE DAILY  1 each  11  . albuterol (PROAIR HFA) 108 (90 BASE) MCG/ACT inhaler  Inhale 2 puffs into the lungs 4 (four) times daily.  2 Inhaler  3  . albuterol (PROVENTIL) (2.5 MG/3ML) 0.083% nebulizer solution USE 1 VIAL IN NEBULIZER EVERY 4 HOURS AS NEEDED  75 mL  5  . ALPRAZolam (XANAX) 0.5 MG tablet Take 1 tablet by mouth 4 (four) times daily as needed.      . Azelastine-Fluticasone (DYMISTA) 137-50 MCG/ACT SUSP Place 2 sprays into both nostrils at bedtime.  1 Bottle  0  . EPINEPHrine (EPI-PEN) 0.3 mg/0.3 mL DEVI Inject 0.3 mLs (0.3 mg total) into the muscle once.  1 Device  prn  . EPINEPHrine (EPIPEN) 0.3 MG/0.3ML DEVI Inject 0.3 mg into the muscle once. For severe allergic reaction       . fluticasone (FLONASE) 50 MCG/ACT nasal spray Place 2 sprays into the nose daily.  16 g  6  . hydrochlorothiazide (HYDRODIURIL) 25 MG tablet TAKE 1 TABLET BY MOUTH EVERY DAY  30 tablet  2  . ibuprofen (ADVIL,MOTRIN) 800 MG tablet Take 800 mg by mouth every 8 (eight) hours as needed for pain.      Marland Kitchen lamoTRIgine (LAMICTAL) 150 MG tablet Take 1 tablet (150 mg total) by mouth daily.  30 tablet  0  . loratadine (CLARITIN) 10 MG tablet Take 1 tablet (10 mg total) by mouth daily.  30 tablet  11  . montelukast (  SINGULAIR) 10 MG tablet TAKE 1 TABLET BY MOUTH EVERY NIGHT AT BEDTIME  30 tablet  11  . omeprazole (PRILOSEC) 20 MG capsule TAKE ONE CAPSULE BY MOUTH EVERY DAY  90 capsule  1  . oxyCODONE (ROXICODONE) 15 MG immediate release tablet Take 10 mg by mouth every 6 (six) hours as needed.       Marland Kitchen QUEtiapine (SEROQUEL) 100 MG tablet       . tiotropium (SPIRIVA HANDIHALER) 18 MCG inhalation capsule Place 1 capsule (18 mcg total) into inhaler and inhale daily.  30 capsule  6  . TRAVATAN Z 0.004 % SOLN ophthalmic solution Place 1 drop into both eyes at bedtime.       No current facility-administered medications on file prior to visit.    ROS: Per HPI   Physical Exam Filed Vitals:   11/26/12 1500  BP: 112/78  Pulse: 90  Temp: 98.1 F (36.7 C)    Physical Examination: General appearance  - crying throughout the discussion of her depression Mental status - alert, depressed mood, affect appropriate to mood, anxious Abdomen - HERNIA EXAM: 2 ventral hernias, reducible, tender, more apparent with valsalva, non-incarcerated Skin - normal coloration and turgor, no rashes, no suspicious skin lesions noted   Assessment/Plan: Please see individual problem list.  I have spent >25 minutes in the care of this patient with >50% spent in counseling/coordination of care regarding depression and hernias.

## 2012-12-30 ENCOUNTER — Telehealth: Payer: Self-pay | Admitting: *Deleted

## 2012-12-30 MED ORDER — ALBUTEROL SULFATE (2.5 MG/3ML) 0.083% IN NEBU: INHALATION_SOLUTION | RESPIRATORY_TRACT | Status: DC

## 2012-12-30 NOTE — Telephone Encounter (Signed)
Pharmacy calling asking for refill on albuterol vials for nebulizer. Will forward to Pcp.

## 2012-12-30 NOTE — Telephone Encounter (Signed)
Refill given

## 2013-01-10 ENCOUNTER — Other Ambulatory Visit: Payer: Self-pay | Admitting: Family Medicine

## 2013-01-10 NOTE — Telephone Encounter (Signed)
Refilled medication for the patient. She should have a follow-up appointment to discuss the state of her asthma/COPD.

## 2013-02-14 ENCOUNTER — Ambulatory Visit (INDEPENDENT_AMBULATORY_CARE_PROVIDER_SITE_OTHER): Payer: Medicare Other | Admitting: Internal Medicine

## 2013-02-14 ENCOUNTER — Encounter: Payer: Self-pay | Admitting: Internal Medicine

## 2013-02-14 VITALS — BP 114/62 | HR 91 | Ht 61.0 in | Wt 201.6 lb

## 2013-02-14 DIAGNOSIS — J449 Chronic obstructive pulmonary disease, unspecified: Secondary | ICD-10-CM

## 2013-02-14 DIAGNOSIS — F17200 Nicotine dependence, unspecified, uncomplicated: Secondary | ICD-10-CM

## 2013-02-14 DIAGNOSIS — J4489 Other specified chronic obstructive pulmonary disease: Secondary | ICD-10-CM

## 2013-02-14 MED ORDER — ALBUTEROL SULFATE (2.5 MG/3ML) 0.083% IN NEBU: INHALATION_SOLUTION | RESPIRATORY_TRACT | Status: DC

## 2013-02-14 MED ORDER — PREDNISONE 10 MG PO TABS: ORAL_TABLET | ORAL | Status: DC

## 2013-02-14 MED ORDER — COMPRESSOR/NEBULIZER MISC: Status: DC

## 2013-02-14 NOTE — Progress Notes (Signed)
07/29/12- 46 yoF 1 ppd smoker referred courtesy of Dr Lula Olszewski for allergy consultation. Husband is a patient here ( blind and on dialysis). Son is also being seen today. She is a former patient, last here in 2004 help with asthma/COPD and allergic rhinitis. She was on allergy vaccine between 2005 and 2010, until she moved away. She recalls allergy vaccine helping her considerably at that time. In the last 2 years she describes increasing sneeze, nasal congestion such that she has to sleep sitting up. She is interested in restarting allergy vaccine. History of sinus infection at least one episode of pneumonia. She has glaucoma well controlled despite Spiriva. Respiratory triggers include seasonal pollens. Environment: House with basement, 2 dogs, no mold. Smoking one pack per day. 2 sons, one autistic.  08/11/12-  46 yoF 1 ppd smoker referred courtesy of Dr Lula Olszewski for allergy consultation. Husband is a patient here ( blind and on dialysis) No antihistamines, OTC cough syrups,or OTC sleep aids. Increased cough, sneeze, eyes watering, wheeze in the last few days off of antihistamines. We discussed her experience with allergy shots between 2005 and 2010.. Medications helped but she felt better on shots and wants to restart. We discussed her smoking. She is not prepared to try to stop now, referring to her very difficult family situation.  Allergy skin test- significant positives for grass weed and tree pollens, dust mite, cockroach. CXR 08/06/12 IMPRESSION:  Mild bibasilar scarring, similar to 2010.  No evidence of acute cardiopulmonary disease.  Original Report Authenticated By: Charline Bills, M.D.  10/13/12- 46 yoF 1 ppd smoker followed for asthma, copd, seasonal allergic rhinitis   PCP Dr Lula Olszewski Husband is a patient here ( blind and on dialysis) She is building allergy vaccine, now 1:50 G0, with no problems. Breathing okay, used nebulizer only 2 or 3 times in July. Unfortunately she  still smokes and pleads emotional overload from her family. She is defensive about her smoking. Some nasal congestion and drainage  02/14/13- 47 yoF 1 ppd smoker followed for asthma, copd, seasonal allergic rhinitis   PCP Dr Lula Olszewski Husband is a patient here ( blind and on dialysis) FOLLOWS FOR:  still on Allergy vaccine 1:50 GO and doing well; pt states she needs order for new nebulizer(has had since 1998) and rx to give to DME company-will need to establish. Smoking cessation counseling. Went to emergency room for bronchitis on Thanksgiving, treated with prednisone and nebulizer. Now improved.   ROS-see HPI Constitutional:   No-   weight loss, night sweats, fevers, chills, fatigue, lassitude. HEENT:   No-  headaches, difficulty swallowing, tooth/dental problems, no-sore throat,       No- sneezing, no-itching, ear ache, +nasal congestion,+ post nasal drip,  CV:  No-   chest pain, orthopnea, PND,+ swelling in lower extremities, no-anasarca,                                  dizziness, palpitations Resp: + shortness of breath with exertion or at rest.              No- productive cough,  No non-productive cough,  No- coughing up of blood.              No-   change in color of mucus.  No- wheezing.   Skin: No-   rash or lesions. GI:  + heartburn, indigestion, No-abdominal pain, nausea, vomiting,  GU:  MS:  + joint pain  or swelling.   Neuro-     nothing unusual Psych:  No- change in mood or affect. + depression or anxiety.  No memory loss.  OBJ- Physical Exam General- Alert, Oriented, Affect-appropriate, Distress- none acute. + Overweight Skin- rash-none, lesions- none, excoriation- none Lymphadenopathy- none Head- atraumatic            Eyes- Gross vision intact, PERRLA, conjunctivae and secretions clear            Ears- Hearing, canals-normal            Nose- + turbinate edema, no-Septal dev, +mucus bridging, polyps, erosion, perforation             Throat- Mallampati III , mucosa  clear , drainage- none, tonsils- atrophic Neck- flexible , trachea midline, no stridor , thyroid nl, carotid no bruit Chest - symmetrical excursion , unlabored           Heart/CV- RRR , no murmur , no gallop  , no rub, nl s1 s2                           - JVD- none , edema- none, stasis changes- none, varices- none           Lung-  wheeze- none, cough+ light , dullness-none, rub- none           Chest wall-  Abd-  Br/ Gen/ Rectal- Not done, not indicated Extrem- cyanosis- none, clubbing, none, atrophy- none, strength- nl Neuro- grossly intact to observation

## 2013-02-14 NOTE — Patient Instructions (Signed)
Script to hold for prednisone taper  Order- [facility] new DME, nebulizer machine and albuterol     Dx chronic bronchitis with COPD               Script for nebulizer and for albuterol

## 2013-02-15 ENCOUNTER — Telehealth: Payer: Self-pay | Admitting: Family Medicine

## 2013-02-15 DIAGNOSIS — K439 Ventral hernia without obstruction or gangrene: Secondary | ICD-10-CM

## 2013-02-15 NOTE — Telephone Encounter (Signed)
Referral has been cancelled in Epic. You will have to place a new one and [facility] will schedule. Fleeger, [name]

## 2013-02-15 NOTE — Telephone Encounter (Signed)
Referral placed.

## 2013-02-15 NOTE — Telephone Encounter (Signed)
Patient calls, she cannot find a Careers adviser in [place] and now wants a referral sent to [facility]. Please call patient with appt info.

## 2013-02-16 ENCOUNTER — Ambulatory Visit (INDEPENDENT_AMBULATORY_CARE_PROVIDER_SITE_OTHER): Payer: Medicare Other

## 2013-02-16 DIAGNOSIS — J309 Allergic rhinitis, unspecified: Secondary | ICD-10-CM

## 2013-02-21 ENCOUNTER — Ambulatory Visit: Payer: Medicare Other | Admitting: Family Medicine

## 2013-03-08 NOTE — Assessment & Plan Note (Signed)
Recent exacerbation of acute bronchitis requiring emergency room visit. Plan-prescription for nebulizer machine and medication. Smoking cessation counseling. Prednisone taper to hold this winter.

## 2013-03-08 NOTE — Assessment & Plan Note (Signed)
Smoking cessation counseling 

## 2013-03-23 ENCOUNTER — Ambulatory Visit: Payer: Medicare Other | Admitting: Family Medicine

## 2013-03-31 ENCOUNTER — Encounter (INDEPENDENT_AMBULATORY_CARE_PROVIDER_SITE_OTHER): Payer: Self-pay | Admitting: Surgery

## 2013-03-31 ENCOUNTER — Ambulatory Visit (INDEPENDENT_AMBULATORY_CARE_PROVIDER_SITE_OTHER): Payer: Medicare Other | Admitting: Surgery

## 2013-03-31 VITALS — BP 128/74 | HR 79 | Temp 98.1°F | Resp 16 | Ht 60.0 in | Wt 186.2 lb

## 2013-03-31 DIAGNOSIS — K439 Ventral hernia without obstruction or gangrene: Secondary | ICD-10-CM

## 2013-03-31 NOTE — Addendum Note (Signed)
Addended byIvory Broad: Ethylene Reznick on: 03/31/2013 01:02 PM   Modules accepted: Orders
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
## 2013-03-31 NOTE — Addendum Note (Signed)
Addended byIvory Broad: Tanveer Brammer on: 03/31/2013 12:39 PM   Modules accepted: Orders

## 2013-04-01 ENCOUNTER — Other Ambulatory Visit: Payer: Medicare Other

## 2013-04-04 ENCOUNTER — Ambulatory Visit
Admission: RE | Admit: 2013-04-04 | Discharge: 2013-04-04 | Disposition: A | Discharge status: Home / Self Care | Payer: Medicare Other | Source: Ambulatory Visit | Attending: Surgery | Admitting: Surgery

## 2013-04-04 ENCOUNTER — Telehealth (INDEPENDENT_AMBULATORY_CARE_PROVIDER_SITE_OTHER): Payer: Self-pay

## 2013-04-04 DIAGNOSIS — K439 Ventral hernia without obstruction or gangrene: Secondary | ICD-10-CM

## 2013-04-04 MED ORDER — IOHEXOL 300 MG/ML  SOLN
100.0000 mL | Freq: Once | INTRAMUSCULAR | Status: AC | PRN
  Administered 2013-04-04: 100 mL via INTRAVENOUS

## 2013-04-04 NOTE — Telephone Encounter (Signed)
Calling to report a call report on the pt's CT scan that is in epic.

## 2013-04-04 NOTE — Telephone Encounter (Signed)
Called and spoke to patient regarding follow up appointment with Dr. Daphine DeutscherMartin.  Reviewed recent CT results with Dr. Daphine DeutscherMartin and he would like to see patient in the office to discuss results.  Patient has not been notified of results at this time.  Dr. Daphine DeutscherMartin will review during office visit.  Appointment 04/07/13 @ 11:50am w/Dr. Daphine DeutscherMartin.

## 2013-04-05 ENCOUNTER — Other Ambulatory Visit (INDEPENDENT_AMBULATORY_CARE_PROVIDER_SITE_OTHER): Payer: Self-pay

## 2013-04-05 DIAGNOSIS — R911 Solitary pulmonary nodule: Secondary | ICD-10-CM

## 2013-04-06 ENCOUNTER — Ambulatory Visit (INDEPENDENT_AMBULATORY_CARE_PROVIDER_SITE_OTHER): Payer: Medicare Other

## 2013-04-06 DIAGNOSIS — J309 Allergic rhinitis, unspecified: Secondary | ICD-10-CM

## 2013-04-07 ENCOUNTER — Ambulatory Visit (INDEPENDENT_AMBULATORY_CARE_PROVIDER_SITE_OTHER): Payer: Medicare Other | Admitting: Surgery

## 2013-04-07 ENCOUNTER — Encounter (INDEPENDENT_AMBULATORY_CARE_PROVIDER_SITE_OTHER): Payer: Self-pay | Admitting: Surgery

## 2013-04-07 VITALS — BP 144/86 | HR 80 | Temp 97.6°F | Resp 16 | Ht 60.0 in | Wt 199.2 lb

## 2013-04-07 DIAGNOSIS — R1013 Epigastric pain: Secondary | ICD-10-CM

## 2013-04-07 DIAGNOSIS — G8929 Other chronic pain: Secondary | ICD-10-CM | POA: Insufficient documentation
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
## 2013-04-13 ENCOUNTER — Other Ambulatory Visit: Payer: Self-pay | Admitting: Family Medicine

## 2013-04-13 NOTE — Telephone Encounter (Signed)
Refill for one month given. Patient should be advised to f/u in clinic as it has been greater than 3 months since she was seen.

## 2013-04-13 NOTE — Telephone Encounter (Signed)
Pt is aware of need for appt and will call tomorrow to schedule.  Singulair is still pending.  Hanny Elsberry,CMA

## 2013-04-14 ENCOUNTER — Encounter: Payer: Self-pay | Admitting: *Deleted

## 2013-04-14 MED ORDER — MONTELUKAST SODIUM 10 MG PO TABS: ORAL_TABLET | ORAL | Status: DC

## 2013-04-14 NOTE — Telephone Encounter (Signed)
Singulair was not refilled 04/13/2013 will reroute request to Dr. Birdie SonsSonnenberg.  Clovis PuMartin, Tamika L, RN

## 2013-04-14 NOTE — Telephone Encounter (Signed)
This encounter was created in error - please disregard.

## 2013-05-16 ENCOUNTER — Encounter: Payer: Self-pay | Admitting: Family Medicine

## 2013-05-16 ENCOUNTER — Ambulatory Visit (HOSPITAL_COMMUNITY)
Admission: RE | Admit: 2013-05-16 | Discharge: 2013-05-16 | Disposition: A | Discharge status: Home / Self Care | Payer: Medicare Other | Source: Ambulatory Visit | Attending: Family Medicine | Admitting: Family Medicine

## 2013-05-16 ENCOUNTER — Ambulatory Visit (INDEPENDENT_AMBULATORY_CARE_PROVIDER_SITE_OTHER): Payer: Medicare Other | Admitting: Family Medicine

## 2013-05-16 ENCOUNTER — Telehealth: Payer: Self-pay | Admitting: Family Medicine

## 2013-05-16 ENCOUNTER — Other Ambulatory Visit (HOSPITAL_COMMUNITY)
Admission: RE | Admit: 2013-05-16 | Discharge: 2013-05-16 | Disposition: A | Discharge status: Home / Self Care | Payer: Medicare Other | Source: Ambulatory Visit | Attending: Family Medicine | Admitting: Family Medicine

## 2013-05-16 VITALS — BP 118/79 | HR 54 | Temp 97.8°F | Wt 191.0 lb

## 2013-05-16 DIAGNOSIS — Z124 Encounter for screening for malignant neoplasm of cervix: Secondary | ICD-10-CM | POA: Insufficient documentation

## 2013-05-16 DIAGNOSIS — M19049 Primary osteoarthritis, unspecified hand: Secondary | ICD-10-CM | POA: Insufficient documentation

## 2013-05-16 DIAGNOSIS — M79609 Pain in unspecified limb: Secondary | ICD-10-CM

## 2013-05-16 DIAGNOSIS — R21 Rash and other nonspecific skin eruption: Secondary | ICD-10-CM

## 2013-05-16 DIAGNOSIS — M79641 Pain in right hand: Secondary | ICD-10-CM

## 2013-05-16 DIAGNOSIS — M7989 Other specified soft tissue disorders: Secondary | ICD-10-CM

## 2013-05-16 LAB — D-DIMER, QUANTITATIVE (NOT AT ARMC): D DIMER QUANT: 0.72 ug{FEU}/mL — AB (ref 0.00–0.48)

## 2013-05-16 MED ORDER — TRIAMCINOLONE ACETONIDE 0.1 % EX OINT: 1.0000 "application " | TOPICAL_OINTMENT | Freq: Two times a day (BID) | CUTANEOUS | Status: DC

## 2013-05-16 NOTE — Telephone Encounter (Signed)
Spoke with patient regarding D-dimer result. This came back elevated at 0.72. Discussed the options of going to the ED for further evaluation tonight or waiting until tomorrow to have an US as an outpatient. Discussed risks of PE if this was a blood clot. At her visit there was low clinical suspicion for a DVT given the patients lack of calf tenderness and negative homan sign, though there was a discrepancy in the level of swelling between the right calf and the left and thus a d-dimer was ordered to rule out DVT. After this returned positive I called the patient to discuss the option of being evaluated this evening vs tomorrow. After discussion with patient and allowing her to talk to her family, [facility] and likely US of her right LE to rule out DVT.

## 2013-05-16 NOTE — Patient Instructions (Signed)
Nice to see you. Please go to the hospital to get the X-ray of your hand. Please go to the pharmacy to get the cock up splint.  Use the steroid cream on your rash. If the swelling gets worse or you develop chest pain or shortness of breath please seek medical attention.

## 2013-05-16 NOTE — Progress Notes (Signed)
Patient ID: [name], female   DOB: 06/09/1965, 48 y.o.   MRN: [id number]  Marikay AlarEric Jayshawn Colston, MD Phone: [phone]  [name] is a 48 y.o. female who presents today for discussion of rash and hand pain. And for pap smear.  Rash on leg: has been present for the past 2 weeks. Started out like a bruise. Has been itching. Was dry and has been using lotion on it. Not getting better. No fevers or chills. No changes in soap, detergents, foods. Notes no changes in environment. No new drugs. No recent surgeries or long travel. No OCPs.  Right hand pain: notes pain in the thenar eminence to the point of tears. Has tried massaging it with some benefit. Notes some numbness in this hand. No history of carpal tunnel. Notes this pain is worse at night, though does not note sleeping a particular position. Notes doing house work, but no particular repetitive movements.  Due for pap smear as well.  Patient is a current smoker.  ROS: Per HPI   Physical Exam Filed Vitals:   05/16/13 0925  BP: 118/79  Pulse: 54  Temp: 97.8 F (36.6 C)    Physical Examination: General appearance - alert, well appearing, and in no distress GU Female - normal external labia and vaginal mucosa, cervix appreciated without any lesions, no discharge or bleeding noted Musculoskeletal - right hand with no signs of swelling or erythema, full ROM noted throughout hand, there is tenderness to palpation over the thenar emminence, no tenderness over the snuff box, sensation to light touch intact throughout right hand, 4+/5 grip strength limited by pain, negative tenel sign, left hand with no signs of swelling or erythema, full ROM noted throughout hand, there is no tenderness to palpation, sensation to light touch intact throughout right hand, 5/5 grip strength Extremities - right LE with swelling, trace edema, no tenderness to palpation of calf, negative homan sign, left LE without swelling or tenderness Skin - DERMATITIS NOTED:  patch of erythema and papules with excoriation on the anterior portion of the right lower and mid shin, this is minimally tender   Assessment/Plan: Please see individual problem list.

## 2013-05-17 ENCOUNTER — Telehealth: Payer: Self-pay | Admitting: Family Medicine

## 2013-05-17 DIAGNOSIS — R21 Rash and other nonspecific skin eruption: Secondary | ICD-10-CM | POA: Insufficient documentation

## 2013-05-17 DIAGNOSIS — M79641 Pain in right hand: Secondary | ICD-10-CM | POA: Insufficient documentation

## 2013-05-17 NOTE — Assessment & Plan Note (Signed)
In RLE. No signs of infection. There is a size discrepancy between her left and right legs with the right being larger. May be related to DVT and stasis dermatitis or a contact dermatitis. Will send a D-dimer to eval for DVT. Will give steroid cream for dermatitis component. Return precautions discussed.

## 2013-05-17 NOTE — Assessment & Plan Note (Signed)
Patient with negative exam findings. Potentially related to arthritic changes vs carpal tunnel. Given prescription for cock-up splint. Will obtain XR of hand. F/u prn.

## 2013-05-17 NOTE — Telephone Encounter (Signed)
Pt went to ER last night and had the ultrasound done. There is no blood clot. She wants to talk to dr about what to do next

## 2013-05-17 NOTE — Telephone Encounter (Signed)
Spoke with patient about xrays (see result note)  Pt wanted to let the MD know that there was no blood clot and they think it was a "bacterial infection" and gave her keflex.  Advised I would send message to MD. Milas GainFleeger, Maryjo RochesterJessica Dawn

## 2013-05-17 NOTE — Telephone Encounter (Signed)
Sounds good. Thanks for letting me know. The patient should finish the course of antibiotics as they prescribed. If this area is not improving please advise the patient to follow-up in the office.

## 2013-05-18 ENCOUNTER — Encounter: Payer: Self-pay | Admitting: Family Medicine

## 2013-06-23 ENCOUNTER — Ambulatory Visit: Payer: Medicare Other | Admitting: Family Medicine

## 2013-07-20 ENCOUNTER — Other Ambulatory Visit: Payer: Self-pay | Admitting: Family Medicine

## 2013-08-09 ENCOUNTER — Telehealth: Payer: Self-pay | Admitting: Family Medicine

## 2013-08-09 DIAGNOSIS — M25559 Pain in unspecified hip: Secondary | ICD-10-CM

## 2013-08-09 NOTE — Telephone Encounter (Signed)
Pt called and needs a referral to an Orthopedic doctor. She was referred by her pain management  doctor but the referral has to come from the PCP. jw

## 2013-08-09 NOTE — Telephone Encounter (Signed)
Will forward to MD. Aline Wesche,CMA  

## 2013-08-10 NOTE — Telephone Encounter (Signed)
Spoke with Dr. Aneta Mins office @ [facility] and they will fax over her office notes.  Also they want her to see [facility]- Dr. Ophelia Charter.  When placing referral please put this name in comment section. Mikah Poss,CMA

## 2013-08-10 NOTE — Telephone Encounter (Signed)
I need to see the notes from the pain clinic physician prior to placing a referral. If you could obtain these for me I would be appreciative. Thanks.

## 2013-08-11 NOTE — Telephone Encounter (Signed)
Referral placed.

## 2013-08-19 ENCOUNTER — Ambulatory Visit (INDEPENDENT_AMBULATORY_CARE_PROVIDER_SITE_OTHER)
Admission: RE | Admit: 2013-08-19 | Discharge: 2013-08-19 | Disposition: A | Discharge status: Home / Self Care | Payer: Medicare Other | Source: Ambulatory Visit | Attending: Internal Medicine | Admitting: Internal Medicine

## 2013-08-19 ENCOUNTER — Ambulatory Visit (INDEPENDENT_AMBULATORY_CARE_PROVIDER_SITE_OTHER): Payer: Medicare Other | Admitting: Internal Medicine

## 2013-08-19 ENCOUNTER — Encounter: Payer: Self-pay | Admitting: Internal Medicine

## 2013-08-19 VITALS — BP 126/80 | HR 65 | Ht 60.0 in | Wt 176.2 lb

## 2013-08-19 DIAGNOSIS — J3089 Other allergic rhinitis: Secondary | ICD-10-CM

## 2013-08-19 DIAGNOSIS — J302 Other seasonal allergic rhinitis: Secondary | ICD-10-CM

## 2013-08-19 DIAGNOSIS — J449 Chronic obstructive pulmonary disease, unspecified: Secondary | ICD-10-CM

## 2013-08-19 DIAGNOSIS — J42 Unspecified chronic bronchitis: Secondary | ICD-10-CM

## 2013-08-19 DIAGNOSIS — E669 Obesity, unspecified: Secondary | ICD-10-CM

## 2013-08-19 DIAGNOSIS — F17200 Nicotine dependence, unspecified, uncomplicated: Secondary | ICD-10-CM

## 2013-08-19 DIAGNOSIS — J309 Allergic rhinitis, unspecified: Secondary | ICD-10-CM

## 2013-08-19 DIAGNOSIS — R918 Other nonspecific abnormal finding of lung field: Secondary | ICD-10-CM

## 2013-08-19 NOTE — Patient Instructions (Signed)
Order- office spirometry   Dx chronic bronchitis  Order- CXR  Please keep trying to stop smoking

## 2013-08-19 NOTE — Progress Notes (Signed)
07/29/12- 46 yoF 1 ppd smoker referred courtesy of Dr [name] for allergy consultation. Husband is a patient here ( blind and on dialysis). Son is also being seen today. She is a former patient, last here in 2004 help with asthma/COPD and allergic rhinitis. She was on allergy vaccine between 2005 and 2010, until she moved away. She recalls allergy vaccine helping her considerably at that time. In the last 2 years she describes increasing sneeze, nasal congestion such that she has to sleep sitting up. She is interested in restarting allergy vaccine. History of sinus infection at least one episode of pneumonia. She has glaucoma well controlled despite Spiriva. Respiratory triggers include seasonal pollens. Environment: House with basement, 2 dogs, no mold. Smoking one pack per day. 2 sons, one autistic.  08/11/12-  46 yoF 1 ppd smoker referred courtesy of Dr [name] for allergy consultation. Husband is a patient here ( blind and on dialysis) No antihistamines, OTC cough syrups,or OTC sleep aids. Increased cough, sneeze, eyes watering, wheeze in the last few days off of antihistamines. We discussed her experience with allergy shots between 2005 and 2010.. Medications helped but she felt better on shots and wants to restart. We discussed her smoking. She is not prepared to try to stop now, referring to her very difficult family situation.  Allergy skin test- significant positives for grass weed and tree pollens, dust mite, cockroach. CXR 08/06/12 IMPRESSION:  Mild bibasilar scarring, similar to 2010.  No evidence of acute cardiopulmonary disease.  Original Report Authenticated By: Charline BillsSriyesh Krishnan, M.D.  10/13/12- 46 yoF 1 ppd smoker followed for asthma, copd, seasonal allergic rhinitis   PCP Dr [name] Husband is a patient here ( blind and on dialysis) She is building allergy vaccine, now 1:50 G0, with no problems. Breathing okay, used nebulizer only 2 or 3 times in July. Unfortunately she  still smokes and pleads emotional overload from her family. She is defensive about her smoking. Some nasal congestion and drainage  02/14/13- 47 yoF 1 ppd smoker followed for asthma, copd, seasonal allergic rhinitis   PCP Dr [name] Husband is a patient here ( blind and on dialysis) FOLLOWS FOR:  still on Allergy vaccine 1:50 GO and doing well; pt states she needs order for new nebulizer(has had since 1998) and rx to give to DME company-will need to establish. Smoking cessation counseling. Went to emergency room for bronchitis on Thanksgiving, treated with prednisone and nebulizer. Now improved.  08/19/13- 47 yoF 1 ppd smoker followed for asthma, copd, seasonal allergic rhinitis   PCP Dr [name] Husband is a patient here ( blind and on dialysis) Pending right hip surgery. Still smokes but states interested in trying to quit, listing wide variety of failed previous approaches including Chantix, Wellbutrin, counseling. She continues dieting weight off slowly, having lost more than 100 pounds over 3 years. CT scan abdomen and pelvis 04/04/2013 noted lung nodules-discussed Office Spirometry-08/19/2013-severe obstructive airways disease. FVC 2.05/71%, FEV1 1.04/42%, FEV1/FVC 0.51, FEF 25-75%  0.50/17%   ROS-see HPI Constitutional:   No-   weight loss, night sweats, fevers, chills, fatigue, lassitude. HEENT:   No-  headaches, difficulty swallowing, tooth/dental problems, no-sore throat,       No- sneezing, no-itching, ear ache, +nasal congestion,+ post nasal drip,  CV:  No-   chest pain, orthopnea, PND,+ swelling in lower extremities, no-anasarca,  dizziness, palpitations Resp: + shortness of breath with exertion or at rest.              No- productive cough,  No non-productive cough,  No- coughing up of blood.              No-   change in color of mucus.  No- wheezing.   Skin: No-   rash or lesions. GI:  + heartburn, indigestion, No-abdominal pain, nausea,  vomiting,  GU:  MS:  + joint pain or swelling.   Neuro-     nothing unusual Psych:  No- change in mood or affect. + depression or anxiety.  No memory loss.  OBJ- Physical Exam General- Alert, Oriented, Affect-appropriate, Distress- none acute. + Overweight Skin- rash-none, lesions- none, excoriation- none Lymphadenopathy- none Head- atraumatic            Eyes- Gross vision intact, PERRLA, conjunctivae and secretions clear            Ears- Hearing, canals-normal            Nose- + turbinate edema, no-Septal dev, +mucus bridging, polyps, erosion, perforation             Throat- Mallampati III , mucosa clear , drainage- none, tonsils- atrophic Neck- flexible , trachea midline, no stridor , thyroid nl, carotid no bruit Chest - symmetrical excursion , unlabored           Heart/CV- RRR , no murmur , no gallop  , no rub, nl s1 s2                           - JVD- none , edema- none, stasis changes- none, varices- none           Lung-  wheeze- none, cough+ light , dullness-none, rub- none           Chest wall-  Abd-  Br/ Gen/ Rectal- Not done, not indicated Extrem- cyanosis- none, clubbing, none, atrophy- none, strength- nl.+cane Neuro- grossly intact to observation

## 2013-08-20 DIAGNOSIS — R918 Other nonspecific abnormal finding of lung field: Secondary | ICD-10-CM | POA: Insufficient documentation

## 2013-08-20 NOTE — Assessment & Plan Note (Signed)
Plan-chest x-ray 

## 2013-08-20 NOTE — Assessment & Plan Note (Signed)
Continues allergy vaccine 1:50 G0. Discussed

## 2013-08-20 NOTE — Assessment & Plan Note (Signed)
We discussed motivations to smoke and motivations for quitting with her available support. [facility] will [facility] about smoking cessation programs based there.

## 2013-08-20 NOTE — Assessment & Plan Note (Signed)
Medication discussion. Smoking cessation emphasized. I expect her to tolerate GOT/ necessary surgery. Pulmonary consultation will be available if needed.

## 2013-08-20 NOTE — Assessment & Plan Note (Signed)
She describes gradual weight loss based on diet. Continued weight loss will help pulmonary capacity and orthopedic problems.

## 2013-08-24 ENCOUNTER — Ambulatory Visit (INDEPENDENT_AMBULATORY_CARE_PROVIDER_SITE_OTHER): Payer: Medicare Other

## 2013-08-24 DIAGNOSIS — J309 Allergic rhinitis, unspecified: Secondary | ICD-10-CM

## 2013-09-22 ENCOUNTER — Other Ambulatory Visit: Payer: Self-pay | Admitting: Family Medicine

## 2013-09-27 ENCOUNTER — Encounter: Payer: Self-pay | Admitting: Family Medicine

## 2013-09-27 ENCOUNTER — Ambulatory Visit (INDEPENDENT_AMBULATORY_CARE_PROVIDER_SITE_OTHER): Payer: Medicare Other | Admitting: Family Medicine

## 2013-09-27 VITALS — BP 119/71 | HR 71 | Temp 98.1°F | Wt 182.0 lb

## 2013-09-27 DIAGNOSIS — M25559 Pain in unspecified hip: Secondary | ICD-10-CM

## 2013-09-27 DIAGNOSIS — N9089 Other specified noninflammatory disorders of vulva and perineum: Secondary | ICD-10-CM | POA: Insufficient documentation

## 2013-09-27 DIAGNOSIS — M25551 Pain in right hip: Secondary | ICD-10-CM

## 2013-09-27 NOTE — Assessment & Plan Note (Signed)
Patient with likely small boil vs ingrown hair. There was a small amount of fluid expressed from the lesion. The lesion was not large enough to I&D. No surrounding cellulitic changes so no need for antibiotics. Advised to use warm compresses to help the area drain more if needed. To keep area clean with soap and water. Given return precautions. F/u prn.

## 2013-09-27 NOTE — Assessment & Plan Note (Signed)
Chronic issue. Pain managed by pain clinic. Would like a second opinion from ortho. [facility] to [facility]. F/u prn.

## 2013-09-27 NOTE — Progress Notes (Signed)
Patient ID: Kelly IhaNancy L [name], female   DOB: [birth year], 48 y.o.   MRN: [id number]  Kelly AlarEric Galileah Piggee, MD Phone: [phone]  [name] is a 48 y.o. female who presents today for f/u.  Bump on vagina: patient notes a week ago she developed a bump between her legs. She tried to pop it with a "sterile needle" and only blood came out. It started small and grew in size initially. Now it is slightly smaller. There has been intermittent pain with this. She notes no fevers since this lesion occurred. She does not know if it has been erythematous.  Right hip pain: patient notes continued right hip pain. She is seen at a pain management clinic for her pain medications. She has been [facility] at [facility] for this issue and notes they injected her hip, which only helped for 3 days. She saw the Dr there last Monday and she states he told her he would not operate on the hip "because he does not deal with hips." He wants to [facility] to [facility]. The patients pain Dr has told her that it is related to 2 ganglion cysts rubbing on each other. She would like a referral for second opinion.  Patient is a smoker.   ROS: Per HPI   Physical Exam Filed Vitals:   09/27/13 1503  BP: 119/71  Pulse: 71  Temp: 98.1 F (36.7 C)    Gen: Well NAD HEENT: PERRL,  MMM Lungs: CTABL Nl WOB Heart: RRR no MRG GU: normal appearing external vagina, there is a small ~0.5 cm boil on the lower aspect of her left labia majora, there is no surrounding erythema or induration, there is no fluctuance, there is a small punctation from which a small amount of purulent material was expressed on palpation of this lesion MSK: there is tenderness to palpation of the anterior hip, there is limited flexion of the hip relating to pain Exts: Non edematous BL  LE, warm and well perfused.    Assessment/Plan: Please see individual problem list.  # Healthcare maintenance: up to date

## 2013-09-27 NOTE — Patient Instructions (Addendum)
Nice to see you. Please keep the area between your vagina and rectum clean with soap and water. You can use warm compresses on the area to help the lesion soften.  If you develop any fevers, redness, recurrence, worsening pain please let [name] know. We will refer you to [facility] for a second opinion on your hip.

## 2013-09-30 ENCOUNTER — Ambulatory Visit (INDEPENDENT_AMBULATORY_CARE_PROVIDER_SITE_OTHER): Payer: Medicare Other | Admitting: Surgery

## 2013-12-19 ENCOUNTER — Encounter: Payer: Self-pay | Admitting: Internal Medicine

## 2013-12-19 ENCOUNTER — Ambulatory Visit: Payer: Medicare Other | Admitting: Internal Medicine

## 2013-12-19 VITALS — BP 122/62 | HR 61 | Ht 61.0 in | Wt 180.8 lb

## 2013-12-19 DIAGNOSIS — Z23 Encounter for immunization: Secondary | ICD-10-CM

## 2013-12-19 MED ORDER — PREDNISONE 10 MG PO TABS: ORAL_TABLET | ORAL | Status: DC

## 2013-12-19 NOTE — Patient Instructions (Signed)
Please keep trying to stop smoking- You don't want to feel like this al the time  Script sent for prednisone  Flu vax  Please call if you need refills or an antibiotic

## 2013-12-19 NOTE — Progress Notes (Signed)
07/29/12- 46 yoF 1 ppd smoker referred courtesy of Dr Lula Olszewskihamberlain for allergy consultation. Husband is a patient here ( blind and on dialysis). Son is also being seen today. She is a former patient, last here in 2004 help with asthma/COPD and allergic rhinitis. She was on allergy vaccine between 2005 and 2010, until she moved away. She recalls allergy vaccine helping her considerably at that time. In the last 2 years she describes increasing sneeze, nasal congestion such that she has to sleep sitting up. She is interested in restarting allergy vaccine. History of sinus infection at least one episode of pneumonia. She has glaucoma well controlled despite Spiriva. Respiratory triggers include seasonal pollens. Environment: House with basement, 2 dogs, no mold. Smoking one pack per day. 2 sons, one autistic.  08/11/12-  46 yoF 1 ppd smoker referred courtesy of Dr Lula Olszewskihamberlain for allergy consultation. Husband is a patient here ( blind and on dialysis) No antihistamines, OTC cough syrups,or OTC sleep aids. Increased cough, sneeze, eyes watering, wheeze in the last few days off of antihistamines. We discussed her experience with allergy shots between 2005 and 2010.. Medications helped but she felt better on shots and wants to restart. We discussed her smoking. She is not prepared to try to stop now, referring to her very difficult family situation.  Allergy skin test- significant positives for grass weed and tree pollens, dust mite, cockroach. CXR 08/06/12 IMPRESSION:  Mild bibasilar scarring, similar to 2010.  No evidence of acute cardiopulmonary disease.  Original Report Authenticated By: Charline BillsSriyesh Krishnan, M.D.  10/13/12- 46 yoF 1 ppd smoker followed for asthma, copd, seasonal allergic rhinitis   PCP Dr Lula Olszewskihamberlain Husband is a patient here ( blind and on dialysis) She is building allergy vaccine, now 1:50 G0, with no problems. Breathing okay, used nebulizer only 2 or 3 times in July. Unfortunately she  still smokes and pleads emotional overload from her family. She is defensive about her smoking. Some nasal congestion and drainage  02/14/13- 47 yoF 1 ppd smoker followed for asthma, copd, seasonal allergic rhinitis   PCP Dr Lula Olszewskihamberlain Husband is a patient here ( blind and on dialysis) FOLLOWS FOR:  still on Allergy vaccine 1:50 GO and doing well; pt states she needs order for new nebulizer(has had since 1998) and rx to give to DME company-will need to establish. Smoking cessation counseling. Went to emergency room for bronchitis on Thanksgiving, treated with prednisone and nebulizer. Now improved.  08/19/13- 47 yoF 1 ppd smoker followed for asthma, copd, seasonal allergic rhinitis   PCP Dr Lula Olszewskihamberlain Husband is a patient here ( blind and on dialysis) Pending right hip surgery. Still smokes but states interested in trying to quit, listing wide variety of failed previous approaches including Chantix, Wellbutrin, counseling. She continues dieting weight off slowly, having lost more than 100 pounds over 3 years. CT scan abdomen and pelvis 04/04/2013 noted lung nodules-discussed Office Spirometry-08/19/2013-severe obstructive airways disease. FVC 2.05/71%, FEV1 1.04/42%, FEV1/FVC 0.51, FEF 25-75%  0.50/17%  12/19/13- 48 yoF 1 ppd smoker followed for asthma, copd, seasonal allergic rhinitis   PCP Dr Lula Olszewskihamberlain FOLLOW FOR:  Pt complains of severe cough, chest congestion, greenish-yellow mucus, fever at times  CXR 08/19/13 IMPRESSION:  Probable linear scarring at the lung bases left-greater-than-right.  No definite active process.  Electronically Signed  By: Dwyane DeePaul Barry M.D.  On: 08/19/2013 12:09    ROS-see HPI Constitutional:   No-   weight loss, night sweats, fevers, chills, fatigue, lassitude. HEENT:   No-  headaches, difficulty  swallowing, tooth/dental problems, no-sore throat,       No- sneezing, no-itching, ear ache, +nasal congestion,+ post nasal drip,  CV:  No-   chest pain, orthopnea,  PND,+ swelling in lower extremities, no-anasarca,                                  dizziness, palpitations Resp: + shortness of breath with exertion or at rest.              No- productive cough,  No non-productive cough,  No- coughing up of blood.              No-   change in color of mucus.  No- wheezing.   Skin: No-   rash or lesions. GI:  + heartburn, indigestion, No-abdominal pain, nausea, vomiting,  GU:  MS:  + joint pain or swelling.   Neuro-     nothing unusual Psych:  No- change in mood or affect. + depression or anxiety.  No memory loss.  OBJ- Physical Exam General- Alert, Oriented, Affect-appropriate, Distress- none acute. + Overweight Skin- rash-none, lesions- none, excoriation- none Lymphadenopathy- none Head- atraumatic            Eyes- Gross vision intact, PERRLA, conjunctivae and secretions clear            Ears- Hearing, canals-normal            Nose- + turbinate edema, no-Septal dev, +mucus bridging, polyps, erosion, perforation             Throat- Mallampati III , mucosa clear , drainage- none, tonsils- atrophic Neck- flexible , trachea midline, no stridor , thyroid nl, carotid no bruit Chest - symmetrical excursion , unlabored           Heart/CV- RRR , no murmur , no gallop  , no rub, nl s1 s2                           - JVD- none , edema- none, stasis changes- none, varices- none           Lung-  wheeze- none, cough+ light , dullness-none, rub- none           Chest wall-  Abd-  Br/ Gen/ Rectal- Not done, not indicated Extrem- cyanosis- none, clubbing, none, atrophy- none, strength- nl.+cane Neuro- grossly intact to observation

## 2014-01-09 ENCOUNTER — Other Ambulatory Visit: Payer: Self-pay | Admitting: Family Medicine

## 2014-01-10 ENCOUNTER — Other Ambulatory Visit: Payer: Self-pay | Admitting: Family Medicine

## 2014-01-11 ENCOUNTER — Encounter: Payer: Self-pay | Admitting: Internal Medicine

## 2014-02-20 ENCOUNTER — Ambulatory Visit: Payer: Medicare Other | Admitting: Internal Medicine

## 2014-02-28 ENCOUNTER — Ambulatory Visit: Payer: Medicare Other | Admitting: Internal Medicine

## 2014-04-05 ENCOUNTER — Telehealth: Payer: Self-pay | Admitting: *Deleted

## 2014-04-05 NOTE — Telephone Encounter (Signed)
Received a call from a representative from [employer] with pt on the third line.  Needing to verify appt and to check if pt could get an assessment for the power wheelchair.  Per Jazmin, CMA have pt bring in forms, but she will be referred to neurologist to complete the assessment.  Clovis PuMartin, Tamika L, RN

## 2014-04-14 ENCOUNTER — Encounter: Payer: Self-pay | Admitting: Family Medicine

## 2014-04-14 ENCOUNTER — Ambulatory Visit
Admission: RE | Admit: 2014-04-14 | Discharge: 2014-04-14 | Disposition: A | Discharge status: Home / Self Care | Payer: Medicare Other | Source: Ambulatory Visit | Attending: Family Medicine | Admitting: Family Medicine

## 2014-04-14 ENCOUNTER — Telehealth: Payer: Self-pay | Admitting: Family Medicine

## 2014-04-14 ENCOUNTER — Ambulatory Visit (INDEPENDENT_AMBULATORY_CARE_PROVIDER_SITE_OTHER): Payer: Medicare Other | Admitting: Family Medicine

## 2014-04-14 VITALS — BP 144/85 | HR 71 | Temp 98.4°F | Ht 61.0 in | Wt 191.8 lb

## 2014-04-14 DIAGNOSIS — J441 Chronic obstructive pulmonary disease with (acute) exacerbation: Secondary | ICD-10-CM

## 2014-04-14 DIAGNOSIS — F17200 Nicotine dependence, unspecified, uncomplicated: Secondary | ICD-10-CM

## 2014-04-14 DIAGNOSIS — M25551 Pain in right hip: Secondary | ICD-10-CM

## 2014-04-14 DIAGNOSIS — Z5181 Encounter for therapeutic drug level monitoring: Secondary | ICD-10-CM

## 2014-04-14 DIAGNOSIS — Z720 Tobacco use: Secondary | ICD-10-CM

## 2014-04-14 MED ORDER — VARENICLINE TARTRATE 0.5 MG X 11 & 1 MG X 42 PO MISC: ORAL | Status: DC

## 2014-04-14 MED ORDER — PREDNISONE 50 MG PO TABS: 50.0000 mg | ORAL_TABLET | Freq: Every day | ORAL | Status: DC

## 2014-04-14 MED ORDER — FLUTICASONE-SALMETEROL 500-50 MCG/DOSE IN AEPB: INHALATION_SPRAY | RESPIRATORY_TRACT | Status: DC

## 2014-04-14 MED ORDER — DOXYCYCLINE HYCLATE 100 MG PO TABS: 100.0000 mg | ORAL_TABLET | Freq: Two times a day (BID) | ORAL | Status: DC

## 2014-04-14 NOTE — Assessment & Plan Note (Signed)
Patient with signs and symptoms of COPD exacerbation. Offered nebulizer treatment in office and she declined. Normal O2 sat. Will treat with 5 day course of prednisone, 10 day course of doxycycline, and albuterol q4 hr for the next 2 days. CXR ordered and revealed atelectasis. Given return precautions.

## 2014-04-14 NOTE — Progress Notes (Signed)
Patient ID: [name], female   DOB: 13-Mar-1965, 49 y.o.   MRN: [id number]  Marikay AlarEric Malynda Smolinski, MD Phone: [phone]  Kelly Ihaancy L Dunphy is a 49 y.o. female who presents today for f/u.  COPD exacerbation: patient reports increasing chest congestion, productive cough, and dyspnea above baseline. She denies fevers. Notes albuterol inhaler helps with this. In the past this improved with prednisone and antibiotics. She used to be on home O2, though not since 2012. She continues to smoke, though would like to quit.  Tobacco abuse: wants to quit smoking. Smokes 1 ppd. Notes has previously been on chantix and would like to try this again. She stopped it in the past due to stress levels driving her back to smoking. Patches have not helped in the past.   Right hip pain: notes this has worsened recently. She is followed by pain management for this issue. [facility]o whom she will see on Monday. She has been told she needs surgery for 2 ganglion cysts in her hip. Notes the pain is intermittent and shooting. Also aches.  Patient is a smoker.   ROS: Per HPI   Physical Exam Filed Vitals:   04/14/14 1056  BP: 144/85  Pulse: 71  Temp: 98.4 F (36.9 C)  O2 sat 95%  Gen: NAD Lungs: mild wheezes throughout, good air movement, Nl WOB Heart: RRR  MSK: right hip exam limited by pain and patient request, had pain with minimal active movement, no apparent swelling on palpation, there was some tenderness on the lateral aspect of her hip Exts: Non edematous BL  LE, warm and well perfused.    Assessment/Plan: Please see individual problem list.  Marikay AlarEric Olen Eaves, MD [facility] PGY-3

## 2014-04-14 NOTE — Patient Instructions (Signed)
Nice to see you. Please start the chantix. Please go get the chest x-ray. Take the doxycycline and prednisone.  If your develop worsening dyspnea, chest pain, fever please seek medical attention.   Chronic Obstructive Pulmonary Disease Chronic obstructive pulmonary disease (COPD) is a common lung condition in which airflow from the lungs is limited. COPD is a general term that can be used to describe many different lung problems that limit airflow, including both chronic bronchitis and emphysema. If you have COPD, your lung function will probably never return to normal, but there are measures you can take to improve lung function and make yourself feel better.  CAUSES   Smoking (common).   Exposure to secondhand smoke.   Genetic problems.  Chronic inflammatory lung diseases or recurrent infections. SYMPTOMS   Shortness of breath, especially with physical activity.   Deep, persistent (chronic) cough with a large amount of thick mucus.   Wheezing.   Rapid breaths (tachypnea).   Gray or bluish discoloration (cyanosis) of the skin, especially in fingers, toes, or lips.   Fatigue.   Weight loss.   Frequent infections or episodes when breathing symptoms become much worse (exacerbations).   Chest tightness. DIAGNOSIS  Your health care provider will take a medical history and perform a physical examination to make the initial diagnosis. Additional tests for COPD may include:   Lung (pulmonary) function tests.  Chest X-ray.  CT scan.  Blood tests. TREATMENT  Treatment available to help you feel better when you have COPD includes:   Inhaler and nebulizer medicines. These help manage the symptoms of COPD and make your breathing more comfortable.  Supplemental oxygen. Supplemental oxygen is only helpful if you have a low oxygen level in your blood.   Exercise and physical activity. These are beneficial for nearly all people with COPD. Some people may also benefit  from a pulmonary rehabilitation program. HOME CARE INSTRUCTIONS   Take all medicines (inhaled or pills) as directed by your health care provider.  Avoid over-the-counter medicines or cough syrups that dry up your airway (such as antihistamines) and slow down the elimination of secretions unless instructed otherwise by your health care provider.   If you are a smoker, the most important thing that you can do is stop smoking. Continuing to smoke will cause further lung damage and breathing trouble. Ask your health care provider for help with quitting smoking. He or she can direct you to community resources or hospitals that provide support.  Avoid exposure to irritants such as smoke, chemicals, and fumes that aggravate your breathing.  Use oxygen therapy and pulmonary rehabilitation if directed by your health care provider. If you require home oxygen therapy, ask your health care provider whether you should purchase a pulse oximeter to measure your oxygen level at home.   Avoid contact with individuals who have a contagious illness.  Avoid extreme temperature and humidity changes.  Eat healthy foods. Eating smaller, more frequent meals and resting before meals may help you maintain your strength.  Stay active, but balance activity with periods of rest. Exercise and physical activity will help you maintain your ability to do things you want to do.  Preventing infection and hospitalization is very important when you have COPD. Make sure to receive all the vaccines your health care provider recommends, especially the pneumococcal and influenza vaccines. Ask your health care provider whether you need a pneumonia vaccine.  Learn and use relaxation techniques to manage stress.  Learn and use controlled breathing techniques as  directed by your health care provider. Controlled breathing techniques include:   Pursed lip breathing. Start by breathing in (inhaling) through your nose for 1 second.  Then, purse your lips as if you were going to whistle and breathe out (exhale) through the pursed lips for 2 seconds.   Diaphragmatic breathing. Start by putting one hand on your abdomen just above your waist. Inhale slowly through your nose. The hand on your abdomen should move out. Then purse your lips and exhale slowly. You should be able to feel the hand on your abdomen moving in as you exhale.   Learn and use controlled coughing to clear mucus from your lungs. Controlled coughing is a series of short, progressive coughs. The steps of controlled coughing are:  1. Lean your head slightly forward.  2. Breathe in deeply using diaphragmatic breathing.  3. Try to hold your breath for 3 seconds.  4. Keep your mouth slightly open while coughing twice.  5. Spit any mucus out into a tissue.  6. Rest and repeat the steps once or twice as needed. SEEK MEDICAL CARE IF:   You are coughing up more mucus than usual.   There is a change in the color or thickness of your mucus.   Your breathing is more labored than usual.   Your breathing is faster than usual.  SEEK IMMEDIATE MEDICAL CARE IF:   You have shortness of breath while you are resting.   You have shortness of breath that prevents you from:  Being able to talk.   Performing your usual physical activities.   You have chest pain lasting longer than 5 minutes.   Your skin color is more cyanotic than usual.  You measure low oxygen saturations for longer than 5 minutes with a pulse oximeter. MAKE SURE YOU:   Understand these instructions.  Will watch your condition.  Will get help right away if you are not doing well or get worse. Document Released: 12/04/2004 Document Revised: 07/11/2013 Document Reviewed: 10/21/2012 [facility] Patient Information 2015 [place], [place]. This information is not intended to replace advice given to you by your health care provider. Make sure you discuss any questions you have with your  health care provider.

## 2014-04-14 NOTE — Telephone Encounter (Signed)
Advised patient of CXR results. Advised no PNA on CXR. Discussed atelectasis with patient. Will proceed with treatment of COPD exacerbation.

## 2014-04-14 NOTE — Assessment & Plan Note (Signed)
Wants to quit. Requesting chantix. Will start on this. Counseled patient on the risk of worsening depression and SI with this medication and that she needs to be attuned to this if she is to take this medication. She follows with psychiatry and is to inform them that she is starting on this medication. She acknowledged the risk of this and stated that she understood this risk. F/u in one month to see how she is progressing with this.

## 2014-04-14 NOTE — Assessment & Plan Note (Signed)
Chronic issue. Worsened recently. Pain managed by pain clinic. She needs to keep her appointment with Ortho on Monday.

## 2014-04-21 ENCOUNTER — Other Ambulatory Visit: Payer: Self-pay | Admitting: Family Medicine

## 2014-04-21 DIAGNOSIS — Z8673 Personal history of transient ischemic attack (TIA), and cerebral infarction without residual deficits: Secondary | ICD-10-CM

## 2014-04-21 NOTE — Progress Notes (Signed)
Pt is aware of this and form placed up front for her to take to neuro. Hayden Mabin,CMA

## 2014-04-26 ENCOUNTER — Encounter: Payer: Self-pay | Admitting: Family Medicine

## 2014-05-08 ENCOUNTER — Ambulatory Visit: Payer: Medicare Other | Admitting: Internal Medicine

## 2014-05-10 ENCOUNTER — Other Ambulatory Visit: Payer: Self-pay | Admitting: Family Medicine

## 2014-05-10 DIAGNOSIS — I10 Essential (primary) hypertension: Secondary | ICD-10-CM

## 2014-05-10 NOTE — Telephone Encounter (Signed)
Refills given. Patient needs to come in for a BMET and lab appointment given that her renal function has not been checked recently. She does not necessarily need to see me at that time.

## 2014-05-11 NOTE — Telephone Encounter (Signed)
Pt is aware and appt made for 07-06-14 when she is in [place] for her pulmo appt. Joana Nolton,CMA

## 2014-06-05 ENCOUNTER — Other Ambulatory Visit: Payer: Self-pay | Admitting: Family Medicine

## 2014-06-26 ENCOUNTER — Telehealth: Payer: Self-pay | Admitting: Internal Medicine

## 2014-06-26 MED ORDER — PREDNISONE 10 MG PO TABS: ORAL_TABLET | ORAL | Status: DC

## 2014-06-26 NOTE — Telephone Encounter (Signed)
Spoke with pt, c/o increased wheezing, prod cough with yellow mucus, some chest tightness with exertion.  Denies fever. Has been taking nebulizers prn Pt requesting prednisone.  [place] [facility] [place]   Last ov:12/19/13 Next ov: 07/06/14  Dr. Maple HudsonYoung please advise.  Thanks!  Allergies  Allergen Reactions  . Asa [Aspirin]   . Hydrocodone-Acetaminophen     REACTION: itching  . Morphine And Related    Current Outpatient Prescriptions on File Prior to Visit  Medication Sig Dispense Refill  . albuterol (PROVENTIL) (2.5 MG/3ML) 0.083% nebulizer solution USE 1 VIAL IN NEBULIZER EVERY 4 HOURS AS NEEDED 75 vial prn  . ALPRAZolam (XANAX) 0.5 MG tablet Take 1 tablet by mouth 2 (two) times daily as needed.     . Azelastine-Fluticasone (DYMISTA) 137-50 MCG/ACT SUSP Place 2 sprays into both nostrils at bedtime. 1 Bottle 0  . citalopram (CELEXA) 20 MG tablet Take 1 tablet by mouth at bedtime.    Marland Kitchen. doxycycline (VIBRA-TABS) 100 MG tablet Take 1 tablet (100 mg total) by mouth 2 (two) times daily. 20 tablet 0  . EPINEPHrine (EPI-PEN) 0.3 mg/0.3 mL DEVI Inject 0.3 mLs (0.3 mg total) into the muscle once. 1 Device prn  . fluticasone (FLONASE) 50 MCG/ACT nasal spray Place 2 sprays into the nose daily. 16 g 6  . Fluticasone-Salmeterol (ADVAIR DISKUS) 500-50 MCG/DOSE AEPB INHALE 1 PUFF INTO THE LUNGS TWICE DAILY 1 each 11  . hydrochlorothiazide (HYDRODIURIL) 25 MG tablet TAKE 1 TABLET ONCE DAILY. 30 tablet 2  . ibuprofen (ADVIL,MOTRIN) 800 MG tablet Take 800 mg by mouth every 8 (eight) hours as needed for pain.    Marland Kitchen. lamoTRIgine (LAMICTAL) 150 MG tablet Take 1 tablet (150 mg total) by mouth daily. 30 tablet 0  . loratadine (CLARITIN) 10 MG tablet Take 1 tablet (10 mg total) by mouth daily. 30 tablet 11  . montelukast (SINGULAIR) 10 MG tablet TAKE 1 TABLET AT BEDTIME. 30 tablet 2  . Nebulizers (COMPRESSOR/NEBULIZER) MISC As directred 1 each 0  . omeprazole (PRILOSEC) 20 MG capsule TAKE 1  CAPSULE DAILY. 90 capsule 1  . OXYCODONE HCL PO Take 20 mg by mouth every 4 (four) hours.     . predniSONE (DELTASONE) 50 MG tablet Take 1 tablet (50 mg total) by mouth daily with breakfast. 5 tablet 0  . PROAIR HFA 108 (90 BASE) MCG/ACT inhaler INHALE 2 PUFFS INTO THE LUNGS FOUR TIMES A DAY. 8.5 g 3  . SPIRIVA HANDIHALER 18 MCG inhalation capsule PLACE 1 CAPSULE INTO INHALER AND INHALE DAILY. 30 capsule 6  . TRAVATAN Z 0.004 % SOLN ophthalmic solution Place 1 drop into both eyes at bedtime.    . triamcinolone ointment (KENALOG) 0.1 % Apply 1 application topically 2 (two) times daily. For 2 weeks 30 g 0  . varenicline (CHANTIX STARTING MONTH PAK) 0.5 MG X 11 & 1 MG X 42 tablet Take one 0.5 mg tablet by mouth once daily for 3 days, then increase to one 0.5 mg tablet twice daily for 4 days, then increase to one 1 mg tablet twice daily. 53 tablet 0   No current facility-administered medications on file prior to visit.

## 2014-06-26 NOTE — Telephone Encounter (Signed)
Offer prednisone 10 mg, # 20, 4 X 2 DAYS, 3 X 2 DAYS, 2 X 2 DAYS, 1 X 2 DAYS  

## 2014-06-26 NOTE — Telephone Encounter (Signed)
lmtcb x1 

## 2014-06-26 NOTE — Telephone Encounter (Signed)
Pt return call.[name] ° °

## 2014-06-26 NOTE — Telephone Encounter (Signed)
Rx has been sent in per CY. Pt is aware. Nothing further was needed. 

## 2014-07-06 ENCOUNTER — Encounter: Payer: Self-pay | Admitting: Internal Medicine

## 2014-07-06 ENCOUNTER — Ambulatory Visit (INDEPENDENT_AMBULATORY_CARE_PROVIDER_SITE_OTHER): Payer: Medicare Other | Admitting: Internal Medicine

## 2014-07-06 ENCOUNTER — Other Ambulatory Visit: Payer: Medicare Other

## 2014-07-06 VITALS — BP 150/84 | HR 69 | Ht 61.0 in | Wt 207.4 lb

## 2014-07-06 DIAGNOSIS — E669 Obesity, unspecified: Secondary | ICD-10-CM | POA: Diagnosis not present

## 2014-07-06 DIAGNOSIS — K219 Gastro-esophageal reflux disease without esophagitis: Secondary | ICD-10-CM

## 2014-07-06 DIAGNOSIS — J449 Chronic obstructive pulmonary disease, unspecified: Secondary | ICD-10-CM | POA: Diagnosis not present

## 2014-07-06 DIAGNOSIS — Z720 Tobacco use: Secondary | ICD-10-CM

## 2014-07-06 DIAGNOSIS — F17200 Nicotine dependence, unspecified, uncomplicated: Secondary | ICD-10-CM

## 2014-07-06 DIAGNOSIS — I10 Essential (primary) hypertension: Secondary | ICD-10-CM

## 2014-07-06 LAB — BASIC METABOLIC PANEL
BUN: 17 mg/dL (ref 6–23)
CO2: 32 meq/L (ref 19–32)
Calcium: 8.5 mg/dL (ref 8.4–10.5)
Chloride: 102 mEq/L (ref 96–112)
Creat: 0.69 mg/dL (ref 0.50–1.10)
Glucose, Bld: 105 mg/dL — ABNORMAL HIGH (ref 70–99)
POTASSIUM: 3.1 meq/L — AB (ref 3.5–5.3)
Sodium: 142 mEq/L (ref 135–145)

## 2014-07-06 NOTE — Progress Notes (Signed)
07/29/12- 46 yoF 1 ppd smoker referred courtesy of Dr Lula Olszewskihamberlain for allergy consultation. Husband is a patient here ( blind and on dialysis). Son is also being seen today. She is a former patient, last here in 2004 help with asthma/COPD and allergic rhinitis. She was on allergy vaccine between 2005 and 2010, until she moved away. She recalls allergy vaccine helping her considerably at that time. In the last 2 years she describes increasing sneeze, nasal congestion such that she has to sleep sitting up. She is interested in restarting allergy vaccine. History of sinus infection at least one episode of pneumonia. She has glaucoma well controlled despite Spiriva. Respiratory triggers include seasonal pollens. Environment: House with basement, 2 dogs, no mold. Smoking one pack per day. 2 sons, one autistic.  08/11/12-  46 yoF 1 ppd smoker referred courtesy of Dr Lula Olszewskihamberlain for allergy consultation. Husband is a patient here ( blind and on dialysis) No antihistamines, OTC cough syrups,or OTC sleep aids. Increased cough, sneeze, eyes watering, wheeze in the last few days off of antihistamines. We discussed her experience with allergy shots between 2005 and 2010.. Medications helped but she felt better on shots and wants to restart. We discussed her smoking. She is not prepared to try to stop now, referring to her very difficult family situation.  Allergy skin test- significant positives for grass weed and tree pollens, dust mite, cockroach. CXR 08/06/12 IMPRESSION:  Mild bibasilar scarring, similar to 2010.  No evidence of acute cardiopulmonary disease.  Original Report Authenticated By: Charline BillsSriyesh Krishnan, M.D.  10/13/12- 46 yoF 1 ppd smoker followed for asthma, copd, seasonal allergic rhinitis   PCP Dr Lula Olszewskihamberlain Husband is a patient here ( blind and on dialysis) She is building allergy vaccine, now 1:50 G0, with no problems. Breathing okay, used nebulizer only 2 or 3 times in July. Unfortunately she  still smokes and pleads emotional overload from her family. She is defensive about her smoking. Some nasal congestion and drainage  02/14/13- 47 yoF 1 ppd smoker followed for asthma, copd, seasonal allergic rhinitis   PCP Dr Lula Olszewskihamberlain Husband is a patient here ( blind and on dialysis) FOLLOWS FOR:  still on Allergy vaccine 1:50 GO and doing well; pt states she needs order for new nebulizer(has had since 1998) and rx to give to DME company-will need to establish. Smoking cessation counseling. Went to emergency room for bronchitis on Thanksgiving, treated with prednisone and nebulizer. Now improved.  08/19/13- 47 yoF 1 ppd smoker followed for asthma, copd, seasonal allergic rhinitis   PCP Dr Lula Olszewskihamberlain Husband is a patient here ( blind and on dialysis) Pending right hip surgery. Still smokes but states interested in trying to quit, listing wide variety of failed previous approaches including Chantix, Wellbutrin, counseling. She continues dieting weight off slowly, having lost more than 100 pounds over 3 years. CT scan abdomen and pelvis 04/04/2013 noted lung nodules-discussed Office Spirometry-08/19/2013-severe obstructive airways disease. FVC 2.05/71%, FEV1 1.04/42%, FEV1/FVC 0.51, FEF 25-75%  0.50/17%  12/19/13- 48 yoF 1 ppd smoker followed for asthma, copd, seasonal allergic rhinitis   PCP Dr Lula Olszewskihamberlain FOLLOW FOR:  Pt complains of severe cough, chest congestion, greenish-yellow mucus, fever at times CXR 08/19/13 IMPRESSION:  Probable linear scarring at the lung bases left-greater-than-right.  No definite active process.  Electronically Signed  By: Dwyane DeePaul Barry M.D.  On: 08/19/2013 12:09  07/06/14-- 48 yoF 1 ppd smoker followed for asthma, copd, seasonal allergic rhinitis   PCP Dr Lula Olszewskihamberlain FOLLOWS FOR: If over exerts then she gets  wheezy and coughs-took partial breathing tx this morning as she was getting dressed. No longer on Prednisone. Pt needs to have surgical clearance for  R hip  surgery on 10-24-14 at [facility] (Dr Dolores Lory) Allergy vaccine 1:50 GO Feels stable, using her nebulizer once or twice daily, Advair 500 twice daily, Spiriva and occasional rescue inhaler. Finish prednisone taper 2 days ago. CXR 04/14/14 IMPRESSION: 1. Mild bibasilar subsegmental atelectasis. 2. Otherwise, no acute cardiopulmonary process. 3. Aortic atherosclerosis. Electronically Signed  By: Malachy Moan M.D.  On: 04/14/2014 13:45 Office spirometry 07/06/14-severe obstructive airways disease. FVC 2.37/79%, FEV1 0.92/37%, FEV1/FVC 0.39, FEF 25-75 percent 0.26/9%.  ROS-see HPI Constitutional:   No-   weight loss, night sweats, fevers, chills, fatigue, lassitude. HEENT:   No-  headaches, difficulty swallowing, tooth/dental problems, no-sore throat,       No- sneezing, no-itching, ear ache, +nasal congestion,+ post nasal drip,  CV:  No-   chest pain, orthopnea, PND,+ swelling in lower extremities, no-anasarca,                                  dizziness, palpitations Resp: + shortness of breath with exertion or at rest.              No- productive cough,  No non-productive cough,  No- coughing up of blood.              No-   change in color of mucus.  No- wheezing.   Skin: No-   rash or lesions. GI:  + heartburn, indigestion, No-abdominal pain, nausea, vomiting,  GU:  MS:  + joint pain or swelling.   Neuro-     nothing unusual Psych:  No- change in mood or affect. + depression or anxiety.  No memory loss.  OBJ- Physical Exam General- Alert, Oriented, Affect-appropriate, Distress- none acute. + + Overweight Skin- rash-none, lesions- none, excoriation- none Lymphadenopathy- none Head- atraumatic            Eyes- Gross vision intact, PERRLA, conjunctivae and secretions clear            Ears- Hearing, canals-normal            Nose- + turbinate edema, no-Septal dev, +mucus bridging, polyps, erosion, perforation             Throat- Mallampati III , mucosa clear , drainage- none, tonsils-  atrophic, + dentures, + hoarseness Neck- flexible , trachea midline, no stridor , thyroid nl, carotid no bruit Chest - symmetrical excursion , unlabored           Heart/CV- RRR , no murmur , no gallop  , no rub, nl s1 s2                           - JVD- none , edema- none, stasis changes- none, varices- none           Lung-  wheeze- none, cough+ light , dullness-none, rub- none           Chest wall-  Abd-  Br/ Gen/ Rectal- Not done, not indicated Extrem- cyanosis- none, clubbing, none, atrophy- none, strength- nl.+cane Neuro- grossly intact to observation

## 2014-07-06 NOTE — Patient Instructions (Addendum)
Order- office spirometry  Dx Asthma with COPD, tobacco user  Please don't smoke !- [facility]  Ok to go forward with the planned hip surgery and general anesthesia  We will have the allergy lab advance your allergy vaccine to 1:10 GO at next order

## 2014-07-06 NOTE — Progress Notes (Signed)
BMP DONE TODAY [name] 

## 2014-07-07 ENCOUNTER — Telehealth: Payer: Self-pay | Admitting: Family Medicine

## 2014-07-07 ENCOUNTER — Encounter: Payer: Self-pay | Admitting: Internal Medicine

## 2014-07-07 MED ORDER — POTASSIUM CHLORIDE CRYS ER 20 MEQ PO TBCR: 40.0000 meq | EXTENDED_RELEASE_TABLET | Freq: Every day | ORAL | Status: DC

## 2014-07-07 NOTE — Assessment & Plan Note (Signed)
Reminded of reflux precautions 

## 2014-07-07 NOTE — Assessment & Plan Note (Signed)
She is overweight and that has to contribute to exertional dyspnea.

## 2014-07-07 NOTE — Telephone Encounter (Signed)
Called patient to discuss lab results. Advised of low potassium and that this is likely related to her HCTZ. I will send in potassium supplements for her to take the next 2 days. I advised that she should hold her HCTZ at this time. I advised the patient that I would like for her to follow-up on Monday for recheck of potassium and to discuss addition of another blood pressure medication. She stated she could not come on Monday or Tuesday, so an appointment was made for Wednesday for follow-up of this issue and to add an alternative medication. She is to check her blood pressure each day until follow-up to ensure that it remains stable off her medication. She is to call the on call doctor or the office if her BP is greater than 150/90 during these checks. She voiced understanding.

## 2014-07-07 NOTE — Assessment & Plan Note (Addendum)
She is near her baseline now as a smoker needing regular use of her medications but controlled. Recent requirement for prednisone taper. We want to keep her away from systemic steroids until her surgery if at all possible, as discussed. She has severe COPD and is borderline for needing home oxygen again. The importance of smoking cessation in advance of necessary surgery was emphasized. There is not an acutely correctable issue that will improve her risks during surgery and general anesthesia more than smoking cessation would. I tried to make that clear to her.

## 2014-07-07 NOTE — Assessment & Plan Note (Signed)
Counseled again about motivation and support options but she is not trying to stop smoking. She says Chantix "made me mean".

## 2014-08-21 ENCOUNTER — Telehealth: Payer: Self-pay | Admitting: Internal Medicine

## 2014-08-21 NOTE — Telephone Encounter (Signed)
Thanks for the script. I called [name] back she is going to pick-up her 1:10 vac. Wed.. Closing note nothing further needed.

## 2014-08-21 NOTE — Telephone Encounter (Signed)
[name] called this morning and said you changed her vac.Marland Kitchen Upon further investigation I found in your 06/2014 notes you did advance her to 1:10 Please write [place] out a rx.

## 2014-08-24 ENCOUNTER — Ambulatory Visit (INDEPENDENT_AMBULATORY_CARE_PROVIDER_SITE_OTHER): Payer: Medicare Other

## 2014-08-24 ENCOUNTER — Telehealth: Payer: Self-pay | Admitting: Internal Medicine

## 2014-08-24 DIAGNOSIS — J309 Allergic rhinitis, unspecified: Secondary | ICD-10-CM | POA: Diagnosis not present

## 2014-08-25 ENCOUNTER — Telehealth: Payer: Self-pay | Admitting: Internal Medicine

## 2014-08-25 NOTE — Telephone Encounter (Signed)
Lmtcb.

## 2014-08-28 MED ORDER — "TUBERCULIN-ALLERGY SYRINGES 28G X 1/2"" 1 ML MISC": Status: DC

## 2014-08-28 NOTE — Telephone Encounter (Signed)
75ml with 25-27 gauge 3/8-5/8" needles #100 with prn refills.  Sig: use to give allergy vaccine as directed.

## 2014-08-28 NOTE — Telephone Encounter (Signed)
Rx has been sent in. Pt is aware. Nothing further was needed. 

## 2014-08-28 NOTE — Telephone Encounter (Signed)
Katie please advise what size needles are normally sent for patient's to self-administer allergy injections at home.  Tammy in allergy did not know exact sizing - please advise Katie on the correct Rx.  Patient is wanting these sent to [facility] in [place] Thanks.

## 2014-09-07 ENCOUNTER — Other Ambulatory Visit: Payer: Self-pay | Admitting: Family Medicine

## 2014-09-22 NOTE — Telephone Encounter (Signed)
Date Mixed: 08/24/14 Vial: 2 Strength: 1:10 Here/Mail/Pick Up: pick up  Mixed By: tbs

## 2014-10-02 DIAGNOSIS — F32A Depression, unspecified: Secondary | ICD-10-CM | POA: Insufficient documentation

## 2014-10-02 DIAGNOSIS — F419 Anxiety disorder, unspecified: Secondary | ICD-10-CM

## 2014-10-02 DIAGNOSIS — H409 Unspecified glaucoma: Secondary | ICD-10-CM | POA: Insufficient documentation

## 2014-10-02 DIAGNOSIS — G894 Chronic pain syndrome: Secondary | ICD-10-CM | POA: Insufficient documentation

## 2014-10-02 DIAGNOSIS — F329 Major depressive disorder, single episode, unspecified: Secondary | ICD-10-CM | POA: Insufficient documentation

## 2014-10-02 DIAGNOSIS — J449 Chronic obstructive pulmonary disease, unspecified: Secondary | ICD-10-CM | POA: Insufficient documentation

## 2014-10-09 ENCOUNTER — Other Ambulatory Visit: Payer: Self-pay | Admitting: Family Medicine

## 2014-10-09 ENCOUNTER — Other Ambulatory Visit: Payer: Self-pay | Admitting: *Deleted

## 2014-10-09 DIAGNOSIS — I10 Essential (primary) hypertension: Secondary | ICD-10-CM

## 2014-10-09 MED ORDER — HYDROCHLOROTHIAZIDE 25 MG PO TABS: 25.0000 mg | ORAL_TABLET | Freq: Every day | ORAL | Status: DC

## 2014-10-11 ENCOUNTER — Other Ambulatory Visit: Payer: Self-pay | Admitting: Family Medicine

## 2014-10-11 NOTE — Telephone Encounter (Signed)
Pt refill request was sent to Dr. Antony Haste and he is no longer her PCP. So i am sending it to you because you are being shown on her chart has her PCP.

## 2014-10-27 ENCOUNTER — Encounter: Payer: Self-pay | Admitting: Internal Medicine

## 2014-11-01 DIAGNOSIS — Z96641 Presence of right artificial hip joint: Secondary | ICD-10-CM | POA: Insufficient documentation

## 2014-11-17 DIAGNOSIS — Z471 Aftercare following joint replacement surgery: Secondary | ICD-10-CM | POA: Insufficient documentation

## 2014-11-17 DIAGNOSIS — Z96641 Presence of right artificial hip joint: Secondary | ICD-10-CM | POA: Insufficient documentation

## 2014-11-23 ENCOUNTER — Ambulatory Visit (INDEPENDENT_AMBULATORY_CARE_PROVIDER_SITE_OTHER): Payer: Medicare Other

## 2014-11-23 ENCOUNTER — Telehealth: Payer: Self-pay | Admitting: Internal Medicine

## 2014-11-23 DIAGNOSIS — J309 Allergic rhinitis, unspecified: Secondary | ICD-10-CM

## 2014-11-23 NOTE — Telephone Encounter (Signed)
Allergy Serum Extract Date Mixed: 11/23/2014 Vial: AB Strength: 1:10 Here/Mail/Pick Up: Pick Up Mixed By: Jaynee Eagles, CMA

## 2014-12-11 ENCOUNTER — Other Ambulatory Visit: Payer: Self-pay | Admitting: *Deleted

## 2014-12-11 ENCOUNTER — Other Ambulatory Visit: Payer: Self-pay | Admitting: Family Medicine

## 2014-12-11 MED ORDER — MONTELUKAST SODIUM 10 MG PO TABS: 10.0000 mg | ORAL_TABLET | Freq: Every day | ORAL | Status: DC

## 2014-12-11 MED ORDER — IBUPROFEN 800 MG PO TABS: 800.0000 mg | ORAL_TABLET | Freq: Three times a day (TID) | ORAL | Status: DC | PRN

## 2014-12-21 DIAGNOSIS — T8459XA Infection and inflammatory reaction due to other internal joint prosthesis, initial encounter: Secondary | ICD-10-CM | POA: Insufficient documentation

## 2014-12-21 DIAGNOSIS — Z96649 Presence of unspecified artificial hip joint: Secondary | ICD-10-CM

## 2015-01-08 ENCOUNTER — Ambulatory Visit: Payer: Medicare Other | Admitting: Internal Medicine

## 2015-01-09 ENCOUNTER — Other Ambulatory Visit: Payer: Self-pay | Admitting: *Deleted

## 2015-01-09 DIAGNOSIS — I10 Essential (primary) hypertension: Secondary | ICD-10-CM

## 2015-01-10 MED ORDER — HYDROCHLOROTHIAZIDE 25 MG PO TABS: 25.0000 mg | ORAL_TABLET | Freq: Every day | ORAL | Status: DC

## 2015-02-12 ENCOUNTER — Other Ambulatory Visit: Payer: Self-pay | Admitting: *Deleted

## 2015-02-13 MED ORDER — ALBUTEROL SULFATE HFA 108 (90 BASE) MCG/ACT IN AERS: INHALATION_SPRAY | RESPIRATORY_TRACT | Status: DC

## 2015-02-16 ENCOUNTER — Encounter: Payer: Self-pay | Admitting: Internal Medicine

## 2015-03-14 ENCOUNTER — Telehealth: Payer: Self-pay | Admitting: Internal Medicine

## 2015-03-14 MED ORDER — AZITHROMYCIN 250 MG PO TABS: ORAL_TABLET | ORAL | Status: DC

## 2015-03-14 MED ORDER — PREDNISONE 10 MG PO TABS: ORAL_TABLET | ORAL | Status: DC

## 2015-03-14 NOTE — Telephone Encounter (Signed)
Offer Zpak            Prednisone 10 mg, # 20, 4 X 2 DAYS, 3 X 2 DAYS, 2 X 2 DAYS, 1 X 2 DAYS    

## 2015-03-14 NOTE — Telephone Encounter (Signed)
Spoke with pt, aware of recs.  rx sent to pharmacy.  Nothing further needed.  

## 2015-03-14 NOTE — Telephone Encounter (Signed)
Patient is very congested, coughing up green mucus, stuffy feeling, having to use breathing treatments more often. Trying to catch this before it turns to Pneumonia.    [facility] - [place]  Allergies  Allergen Reactions  . Asa [Aspirin]   . Hydrocodone-Acetaminophen     REACTION: itching  . Morphine And Related    Current Outpatient Prescriptions on File Prior to Visit  Medication Sig Dispense Refill  . albuterol (PROAIR HFA) 108 (90 BASE) MCG/ACT inhaler INHALE 2 PUFFS INTO THE LUNGS FOUR TIMES A DAY. 8.5 g 3  . albuterol (PROVENTIL) (2.5 MG/3ML) 0.083% nebulizer solution USE 1 VIAL IN NEBULIZER EVERY 4 HOURS AS NEEDED 75 vial prn  . ALPRAZolam (XANAX) 0.5 MG tablet Take 1 tablet by mouth 2 (two) times daily as needed.     . citalopram (CELEXA) 20 MG tablet Take 1 tablet by mouth at bedtime.    Marland Kitchen. EPINEPHrine (EPI-PEN) 0.3 mg/0.3 mL DEVI Inject 0.3 mLs (0.3 mg total) into the muscle once. 1 Device prn  . fluticasone (FLONASE) 50 MCG/ACT nasal spray Place 2 sprays into the nose daily. 16 g 6  . Fluticasone-Salmeterol (ADVAIR DISKUS) 500-50 MCG/DOSE AEPB INHALE 1 PUFF INTO THE LUNGS TWICE DAILY 1 each 11  . hydrochlorothiazide (HYDRODIURIL) 25 MG tablet Take 1 tablet (25 mg total) by mouth daily. 30 tablet 2  . ibuprofen (ADVIL,MOTRIN) 800 MG tablet Take 1 tablet (800 mg total) by mouth every 8 (eight) hours as needed. 30 tablet 0  . lamoTRIgine (LAMICTAL) 150 MG tablet Take 1 tablet (150 mg total) by mouth daily. 30 tablet 0  . loratadine (CLARITIN) 10 MG tablet Take 1 tablet (10 mg total) by mouth daily. 30 tablet 11  . montelukast (SINGULAIR) 10 MG tablet Take 1 tablet (10 mg total) by mouth at bedtime. 30 tablet 2  . Nebulizers (COMPRESSOR/NEBULIZER) MISC As directred 1 each 0  . NONFORMULARY OR COMPOUNDED ITEM Allergy Vaccine 1:10 Given at Home    . omeprazole (PRILOSEC) 20 MG capsule TAKE 1 CAPSULE ONCE A DAY. 90 capsule 1  . OXYCODONE HCL PO Take 20 mg by mouth every  4 (four) hours.     . potassium chloride SA (K-DUR,KLOR-CON) 20 MEQ tablet Take 2 tablets (40 mEq total) by mouth daily. 4 tablet 0  . SPIRIVA HANDIHALER 18 MCG inhalation capsule PLACE 1 CAPSULE INTO INHALER AND INHALE ONCE DAILY. 30 capsule 6  . TRAVATAN Z 0.004 % SOLN ophthalmic solution Place 1 drop into both eyes at bedtime.    . Tuberculin-Allergy Syringes (B-D ALLERGY SYRINGE 1CC/28G) 28G X 1/2" 1 ML MISC Use as directed with allergy injection 100 each 2   No current facility-administered medications on file prior to visit.

## 2015-03-15 ENCOUNTER — Other Ambulatory Visit: Payer: Self-pay | Admitting: *Deleted

## 2015-03-16 NOTE — Telephone Encounter (Signed)
2nd request. Patient is out of medications.  Denny Lave L, RN  

## 2015-03-20 MED ORDER — MONTELUKAST SODIUM 10 MG PO TABS: 10.0000 mg | ORAL_TABLET | Freq: Every day | ORAL | Status: DC

## 2015-04-13 ENCOUNTER — Encounter: Payer: Self-pay | Admitting: Internal Medicine

## 2015-04-13 ENCOUNTER — Ambulatory Visit (INDEPENDENT_AMBULATORY_CARE_PROVIDER_SITE_OTHER): Payer: Medicare Other | Admitting: Internal Medicine

## 2015-04-13 VITALS — BP 130/84 | HR 73 | Ht 61.0 in | Wt 213.8 lb

## 2015-04-13 DIAGNOSIS — F17200 Nicotine dependence, unspecified, uncomplicated: Secondary | ICD-10-CM | POA: Diagnosis not present

## 2015-04-13 DIAGNOSIS — J302 Other seasonal allergic rhinitis: Secondary | ICD-10-CM

## 2015-04-13 DIAGNOSIS — J449 Chronic obstructive pulmonary disease, unspecified: Secondary | ICD-10-CM | POA: Diagnosis not present

## 2015-04-13 DIAGNOSIS — J441 Chronic obstructive pulmonary disease with (acute) exacerbation: Secondary | ICD-10-CM

## 2015-04-13 DIAGNOSIS — J309 Allergic rhinitis, unspecified: Secondary | ICD-10-CM | POA: Diagnosis not present

## 2015-04-13 DIAGNOSIS — J45909 Unspecified asthma, uncomplicated: Secondary | ICD-10-CM

## 2015-04-13 DIAGNOSIS — J3089 Other allergic rhinitis: Secondary | ICD-10-CM

## 2015-04-13 MED ORDER — AZITHROMYCIN 250 MG PO TABS: ORAL_TABLET | ORAL | Status: DC

## 2015-04-13 MED ORDER — PREDNISONE 10 MG PO TABS: ORAL_TABLET | ORAL | Status: DC

## 2015-04-13 MED ORDER — METHYLPREDNISOLONE ACETATE 80 MG/ML IJ SUSP
80.0000 mg | Freq: Once | INTRAMUSCULAR | Status: AC
  Administered 2015-04-13: 80 mg via INTRAMUSCULAR

## 2015-04-13 NOTE — Assessment & Plan Note (Signed)
She continues allergy vaccine and believes is helpful

## 2015-04-13 NOTE — Progress Notes (Signed)
07/29/12- 50 yoF 1 ppd smoker referred courtesy of Dr Lula Olszewski for allergy consultation. Husband is a patient here ( blind and on dialysis). Son is also being seen today. She is a former patient, last here in 2004 help with asthma/COPD and allergic rhinitis. She was on allergy vaccine between 2005 and 2010, until she moved away. She recalls allergy vaccine helping her considerably at that time. In the last 2 years she describes increasing sneeze, nasal congestion such that she has to sleep sitting up. She is interested in restarting allergy vaccine. History of sinus infection at least one episode of pneumonia. She has glaucoma well controlled despite Spiriva. Respiratory triggers include seasonal pollens. Environment: House with basement, 2 dogs, no mold. Smoking one pack per day. 2 sons, one autistic.  08/11/12-  50 yoF 1 ppd smoker referred courtesy of Dr Lula Olszewski for allergy consultation. Husband is a patient here ( blind and on dialysis) No antihistamines, OTC cough syrups,or OTC sleep aids. Increased cough, sneeze, eyes watering, wheeze in the last few days off of antihistamines. We discussed her experience with allergy shots between 2005 and 2010.. Medications helped but she felt better on shots and wants to restart. We discussed her smoking. She is not prepared to try to stop now, referring to her very difficult family situation.  Allergy skin test- significant positives for grass weed and tree pollens, dust mite, cockroach. CXR 08/06/12 IMPRESSION:  Mild bibasilar scarring, similar to 2010.  No evidence of acute cardiopulmonary disease.  Original Report Authenticated By: Charline Bills, M.D.  10/13/12- 46 yoF 1 ppd smoker followed for asthma, copd, seasonal allergic rhinitis   PCP Dr Lula Olszewski Husband is a patient here ( blind and on dialysis) She is building allergy vaccine, now 1:50 G0, with no problems. Breathing okay, used nebulizer only 2 or 3 times in July. Unfortunately she  still smokes and pleads emotional overload from her family. She is defensive about her smoking. Some nasal congestion and drainage  02/14/13- 50 yoF 1 ppd smoker followed for asthma, copd, seasonal allergic rhinitis   PCP Dr Lula Olszewski Husband is a patient here ( blind and on dialysis) FOLLOWS FOR:  still on Allergy vaccine 1:50 GO and doing well; pt states she needs order for new nebulizer(has had since 1998) and rx to give to DME company-will need to establish. Smoking cessation counseling. Went to emergency room for bronchitis on Thanksgiving, treated with prednisone and nebulizer. Now improved.  08/19/13- 50 yoF 1 ppd smoker followed for asthma, copd, seasonal allergic rhinitis   PCP Dr Lula Olszewski Husband is a patient here ( blind and on dialysis) Pending right hip surgery. Still smokes but states interested in trying to quit, listing wide variety of failed previous approaches including Chantix, Wellbutrin, counseling. She continues dieting weight off slowly, having lost more than 100 pounds over 3 years. CT scan abdomen and pelvis 04/04/2013 noted lung nodules-discussed Office Spirometry-08/19/2013-severe obstructive airways disease. FVC 2.05/71%, FEV1 1.04/42%, FEV1/FVC 0.51, FEF 25-75%  0.50/17%  12/19/13- 50 yoF 1 ppd smoker followed for asthma, copd, seasonal allergic rhinitis   PCP Dr Lula Olszewski FOLLOW FOR:  Pt complains of severe cough, chest congestion, greenish-yellow mucus, fever at times CXR 08/19/13 IMPRESSION:  Probable linear scarring at the lung bases left-greater-than-right.  No definite active process.  Electronically Signed  By: Dwyane Dee M.D.  On: 08/19/2013 12:09  07/06/14-- 50 yoF 1 ppd smoker followed for asthma, copd, seasonal allergic rhinitis   PCP Dr Lula Olszewski FOLLOWS FOR: If over exerts then she gets  wheezy and coughs-took partial breathing tx this morning as she was getting dressed. No longer on Prednisone. Pt needs to have surgical clearance for  R hip  surgery on 10-24-14 at [facility] (Dr Dolores Lory) Allergy vaccine 1:50 GO Feels stable, using her nebulizer once or twice daily, Advair 500 twice daily, Spiriva and occasional rescue inhaler. Finish prednisone taper 2 days ago. CXR 04/14/14 IMPRESSION: 1. Mild bibasilar subsegmental atelectasis. 2. Otherwise, no acute cardiopulmonary process. 3. Aortic atherosclerosis. Electronically Signed  By: Malachy Moan M.D.  On: 04/14/2014 13:45 Office spirometry 07/06/14-severe obstructive airways disease. FVC 2.37/79%, FEV1 0.92/37%, FEV1/FVC 0.39, FEF 25-75 percent 0.26/9%.  04/13/2015- 50 year old female one pack per day smoker followed for asthma/COPD, seasonal allergic rhinitis finished zpak and prednisone approx 3 wks ago.  Over the past 2-3 days, c/o congestion, cough with clear to yellow mucus, increased DOE, and wheezing.  Temp 100 x 2 night ago.  Still on vaccine. Allergy vaccine 1:50 GO She has reduced her cigarette smoking to a few cigarettes daily. Did not do well with Chantix in the past. Blames weather for increased cough and wheeze recently. She got better for a little while after Z-Pak and prednisone and feels this would be the best approach to repeat. She is awaiting revision of hip replacement surgery. She is uncomfortable sitting because of her hip and anxious to get home.  ROS-see HPI Constitutional:   No-   weight loss, night sweats, fevers, chills, fatigue, lassitude. HEENT:   No-  headaches, difficulty swallowing, tooth/dental problems, no-sore throat,       No- sneezing, no-itching, ear ache, +nasal congestion,+ post nasal drip,  CV:  No-   chest pain, orthopnea, PND,+ swelling in lower extremities, no-anasarca,                                                       dizziness, palpitations Resp: + shortness of breath with exertion or at rest.              No- productive cough,  No non-productive cough,  No- coughing up of blood.              No-   change in color of mucus.  No-  wheezing.   Skin: No-   rash or lesions. GI:  + heartburn, indigestion, No-abdominal pain, nausea, vomiting,  GU:  MS:  + joint pain or swelling.   Neuro-     nothing unusual Psych:  No- change in mood or affect. + depression or anxiety.  No memory loss.  OBJ- Physical Exam General- Alert, Oriented, Affect-appropriate, Distress- none acute. + + Overweight Skin- rash-none, lesions- none, excoriation- none Lymphadenopathy- none Head- atraumatic            Eyes- Gross vision intact, PERRLA, conjunctivae and secretions clear            Ears- Hearing, canals-normal            Nose- + turbinate edema, no-Septal dev, +mucus bridging, polyps, erosion, perforation             Throat- Mallampati III , mucosa clear , drainage- none, tonsils- atrophic, + dentures, + hoarseness Neck- flexible , trachea midline, no stridor , thyroid nl, carotid no bruit Chest - symmetrical excursion , unlabored           Heart/CV-  RRR , no murmur , no gallop  , no rub, nl s1 s2                           - JVD- none , edema- none, stasis changes- none, varices- none           Lung-  wheeze + coarse/unlabored, cough+  dullness-none, rub- none           Chest wall-  Abd-  Br/ Gen/ Rectal- Not done, not indicated Extrem- cyanosis- none, clubbing, none, atrophy- none, strength- nl.+cane Neuro- grossly intact to observation

## 2015-04-13 NOTE — Assessment & Plan Note (Signed)
She is more willing to consider making an effort to quit now. We discussed and encouraged that.

## 2015-04-13 NOTE — Assessment & Plan Note (Signed)
Acute exacerbation. We discussed treatment options. Willing to repeat prednisone Plan Z-Pak, Depo-Medrol, prednisone taper, fluids and symptom management

## 2015-04-13 NOTE — Patient Instructions (Signed)
Scripts sent for prednisone taper and Z pak  Depo 80      Dx COPD exacerbation  Please try to cut back or stop on your smoking, especially ahead of surgery

## 2015-04-20 ENCOUNTER — Telehealth: Payer: Self-pay | Admitting: Internal Medicine

## 2015-04-20 MED ORDER — PREDNISONE 10 MG PO TABS: ORAL_TABLET | ORAL | Status: DC

## 2015-04-20 MED ORDER — AMOXICILLIN-POT CLAVULANATE 875-125 MG PO TABS: 1.0000 | ORAL_TABLET | Freq: Two times a day (BID) | ORAL | Status: DC

## 2015-04-20 NOTE — Telephone Encounter (Signed)
Per 04/13/15 OV; tient Instructions       Scripts sent for prednisone taper and Z pak Depo 80      Dx COPD exacerbation Please try to cut back or stop on your smoking, especially ahead of surgery   Pt was given round of zpak and prednisone 03/14/15 and 04/13/15.  She is requesting another round of both. C/o chest cong, prod cough (yellow w/ slight green phlem), wheezing, chest tx. Please advise Dr. Maple Hudson thanks  Allergies  Allergen Reactions  . Acetaminophen Nausea Only  . Asa [Aspirin]   . Hydrocodone-Acetaminophen     REACTION: itching  . Morphine And Related     Current Outpatient Prescriptions on File Prior to Visit  Medication Sig Dispense Refill  . albuterol (PROAIR HFA) 108 (90 BASE) MCG/ACT inhaler INHALE 2 PUFFS INTO THE LUNGS FOUR TIMES A DAY. 8.5 g 3  . albuterol (PROVENTIL) (2.5 MG/3ML) 0.083% nebulizer solution USE 1 VIAL IN NEBULIZER EVERY 4 HOURS AS NEEDED 75 vial prn  . ALPRAZolam (XANAX) 0.5 MG tablet Take 1 tablet by mouth 2 (two) times daily as needed.     Marland Kitchen aspirin EC 81 MG tablet Take 1 tablet by mouth daily.    Marland Kitchen azithromycin (ZITHROMAX) 250 MG tablet Take 2 today, then 1 daily until gone. 6 tablet 0  . citalopram (CELEXA) 20 MG tablet Take 1 tablet by mouth at bedtime.    Marland Kitchen EPINEPHrine (EPI-PEN) 0.3 mg/0.3 mL DEVI Inject 0.3 mLs (0.3 mg total) into the muscle once. 1 Device prn  . ferrous sulfate 325 (65 FE) MG EC tablet Take 1 tablet by mouth 2 (two) times daily.    . fluticasone (FLONASE) 50 MCG/ACT nasal spray Place 1 spray into the nose 2 (two) times daily.    . Fluticasone-Salmeterol (ADVAIR DISKUS) 500-50 MCG/DOSE AEPB INHALE 1 PUFF INTO THE LUNGS TWICE DAILY 1 each 11  . gabapentin (NEURONTIN) 300 MG capsule Take 1 capsule by mouth at bedtime.    . hydrochlorothiazide (HYDRODIURIL) 25 MG tablet Take 1 tablet (25 mg total) by mouth daily. 30 tablet 2  . ibuprofen (ADVIL,MOTRIN) 800 MG tablet Take 1 tablet (800 mg total) by mouth every 8 (eight) hours as  needed. 30 tablet 0  . lamoTRIgine (LAMICTAL) 150 MG tablet Take 1 tablet (150 mg total) by mouth daily. 30 tablet 0  . loratadine (CLARITIN) 10 MG tablet Take 1 tablet (10 mg total) by mouth daily. 30 tablet 11  . meloxicam (MOBIC) 15 MG tablet Take 1 tablet by mouth daily.    . montelukast (SINGULAIR) 10 MG tablet Take 1 tablet (10 mg total) by mouth at bedtime. 30 tablet 2  . morphine (MS CONTIN) 30 MG 12 hr tablet Take 1 tablet by mouth 2 (two) times daily.    . Nebulizers (COMPRESSOR/NEBULIZER) MISC As directred 1 each 0  . NONFORMULARY OR COMPOUNDED ITEM Allergy Vaccine 1:10 Given at Home    . omeprazole (PRILOSEC) 20 MG capsule TAKE 1 CAPSULE ONCE A DAY. 90 capsule 1  . OXYCODONE HCL PO Take 15 mg by mouth every 4 (four) hours.     . predniSONE (DELTASONE) 10 MG tablet X2 days,  X2 days,  X2 days, X2 days, then stop. 20 tablet 0  . SPIRIVA HANDIHALER 18 MCG inhalation capsule PLACE 1 CAPSULE INTO INHALER AND INHALE ONCE DAILY. 30 capsule 6  . TRAVATAN Z 0.004 % SOLN ophthalmic solution Place 1 drop into both eyes at bedtime.    . Tuberculin-Allergy Syringes (  B-D ALLERGY SYRINGE 1CC/28G) 28G X 1/2" 1 ML MISC Use as directed with allergy injection 100 each 2   No current facility-administered medications on file prior to visit.

## 2015-04-20 NOTE — Telephone Encounter (Signed)
Recommend augmentin 875 mg, # 14, 1 twice daily                          Prednisone 10 mg, # 15- 2 daily x 4 days then one daily

## 2015-04-20 NOTE — Telephone Encounter (Signed)
Called spoke with pt and is aware of recs below. RX sent in. Nothing further needed 

## 2015-05-12 ENCOUNTER — Other Ambulatory Visit: Payer: Self-pay | Admitting: Family Medicine

## 2015-05-14 ENCOUNTER — Other Ambulatory Visit: Payer: Self-pay | Admitting: *Deleted

## 2015-05-15 DIAGNOSIS — Z96649 Presence of unspecified artificial hip joint: Secondary | ICD-10-CM | POA: Insufficient documentation

## 2015-05-15 MED ORDER — OMEPRAZOLE 20 MG PO CPDR: DELAYED_RELEASE_CAPSULE | ORAL | Status: DC

## 2015-05-15 MED ORDER — TIOTROPIUM BROMIDE MONOHYDRATE 18 MCG IN CAPS: ORAL_CAPSULE | RESPIRATORY_TRACT | Status: DC

## 2015-07-13 ENCOUNTER — Other Ambulatory Visit: Payer: Self-pay | Admitting: *Deleted

## 2015-07-17 MED ORDER — MONTELUKAST SODIUM 10 MG PO TABS: 10.0000 mg | ORAL_TABLET | Freq: Every day | ORAL | Status: DC

## 2015-08-16 ENCOUNTER — Telehealth: Payer: Self-pay | Admitting: Internal Medicine

## 2015-08-16 MED ORDER — PREDNISONE 10 MG PO TABS: ORAL_TABLET | ORAL | Status: DC

## 2015-08-16 MED ORDER — DOXYCYCLINE HYCLATE 100 MG PO TABS: ORAL_TABLET | ORAL | Status: DC

## 2015-08-16 NOTE — Telephone Encounter (Signed)
Called, spoke with pt.  Discussed below recs per Dr. Maple HudsonYoung. Pt verbalized understanding.  Pt feels she needs prednisone as well.  CY, please advise.  Thank you.  ** Pt aware doxy will be sent to pharm AFTER receiving CY response regarding prednisone.

## 2015-08-16 NOTE — Telephone Encounter (Signed)
Ok to add prednisone 10 mg, # 20, 4 X 2 DAYS, 3 X 2 DAYS, 2 X 2 DAYS, 1 X 2 DAYS

## 2015-08-16 NOTE — Telephone Encounter (Signed)
Spoke with pt, c/o sinus congestion, chest congestion, SOB, low grade fever (99) prod cough with green/yellow mucus X2 days. Pt has taken otc cough syrup and neb tx to help with s/s.   Pt uses [facility] in [place].     Last ov: 04/13/15 Next ov: none  CY please advise on recs.  Thanks.   Allergies  Allergen Reactions  . Acetaminophen Nausea Only  . Asa [Aspirin]   . Hydrocodone-Acetaminophen     REACTION: itching  . Morphine And Related    Current Outpatient Prescriptions on File Prior to Visit  Medication Sig Dispense Refill  . ADVAIR DISKUS 500-50 MCG/DOSE AEPB INHALE 1 PUFF INTO THE LUNGS TWICE DAILY 60 each 11  . albuterol (PROAIR HFA) 108 (90 BASE) MCG/ACT inhaler INHALE 2 PUFFS INTO THE LUNGS FOUR TIMES A DAY. 8.5 g 3  . albuterol (PROVENTIL) (2.5 MG/3ML) 0.083% nebulizer solution USE 1 VIAL IN NEBULIZER EVERY 4 HOURS AS NEEDED 75 vial prn  . ALPRAZolam (XANAX) 0.5 MG tablet Take 1 tablet by mouth 2 (two) times daily as needed.     Marland Kitchen amoxicillin-clavulanate (AUGMENTIN) 875-125 MG tablet Take 1 tablet by mouth 2 (two) times daily. 14 tablet 0  . aspirin EC 81 MG tablet Take 1 tablet by mouth daily.    Marland Kitchen azithromycin (ZITHROMAX) 250 MG tablet Take 2 today, then 1 daily until gone. 6 tablet 0  . citalopram (CELEXA) 20 MG tablet Take 1 tablet by mouth at bedtime.    Marland Kitchen EPINEPHrine (EPI-PEN) 0.3 mg/0.3 mL DEVI Inject 0.3 mLs (0.3 mg total) into the muscle once. 1 Device prn  . ferrous sulfate 325 (65 FE) MG EC tablet Take 1 tablet by mouth 2 (two) times daily.    . fluticasone (FLONASE) 50 MCG/ACT nasal spray Place 1 spray into the nose 2 (two) times daily.    Marland Kitchen gabapentin (NEURONTIN) 300 MG capsule Take 1 capsule by mouth at bedtime.    . hydrochlorothiazide (HYDRODIURIL) 25 MG tablet Take 1 tablet (25 mg total) by mouth daily. 30 tablet 2  . ibuprofen (ADVIL,MOTRIN) 800 MG tablet Take 1 tablet (800 mg total) by mouth every 8 (eight) hours as needed. 30 tablet 0  .  lamoTRIgine (LAMICTAL) 150 MG tablet Take 1 tablet (150 mg total) by mouth daily. 30 tablet 0  . loratadine (CLARITIN) 10 MG tablet Take 1 tablet (10 mg total) by mouth daily. 30 tablet 11  . meloxicam (MOBIC) 15 MG tablet Take 1 tablet by mouth daily.    . montelukast (SINGULAIR) 10 MG tablet Take 1 tablet (10 mg total) by mouth at bedtime. 30 tablet 2  . morphine (MS CONTIN) 30 MG 12 hr tablet Take 1 tablet by mouth 2 (two) times daily.    . Nebulizers (COMPRESSOR/NEBULIZER) MISC As directred 1 each 0  . NONFORMULARY OR COMPOUNDED ITEM Allergy Vaccine 1:10 Given at Home    . omeprazole (PRILOSEC) 20 MG capsule TAKE 1 CAPSULE ONCE A DAY. 90 capsule 1  . OXYCODONE HCL PO Take 15 mg by mouth every 4 (four) hours.     . predniSONE (DELTASONE) 10 MG tablet 2 daily x 4 days then one daily 15 tablet 0  . tiotropium (SPIRIVA HANDIHALER) 18 MCG inhalation capsule PLACE 1 CAPSULE INTO INHALER AND INHALE ONCE DAILY. 30 capsule 6  . TRAVATAN Z 0.004 % SOLN ophthalmic solution Place 1 drop into both eyes at bedtime.    . Tuberculin-Allergy Syringes (B-D ALLERGY SYRINGE 1CC/28G) 28G X 1/2"  1 ML MISC Use as directed with allergy injection 100 each 2   No current facility-administered medications on file prior to visit.

## 2015-08-16 NOTE — Telephone Encounter (Signed)
Suggest doxycycline 100 mg, # 8, 2 today then one daily  Mucinex-DM and stay well- hydrated

## 2015-08-28 DIAGNOSIS — Z9889 Other specified postprocedural states: Secondary | ICD-10-CM | POA: Insufficient documentation

## 2015-08-28 DIAGNOSIS — R7303 Prediabetes: Secondary | ICD-10-CM | POA: Insufficient documentation

## 2015-09-12 ENCOUNTER — Telehealth: Payer: Self-pay | Admitting: Internal Medicine

## 2015-09-12 DIAGNOSIS — J309 Allergic rhinitis, unspecified: Secondary | ICD-10-CM

## 2015-09-12 NOTE — Telephone Encounter (Signed)
Pt. Called 09/10/15, got busy and then forgot to ph. Call in. (pick-up)  Allergy Serum Extract Date Mixed: 09/12/15 Vial: 2 Strength: 1:10 Here/Mail/Pick Up: pick up Mixed By: tbs Last OV: 01/08/15 Pending OV: 04/13/15

## 2015-09-14 DIAGNOSIS — Z89621 Acquired absence of right hip joint: Secondary | ICD-10-CM | POA: Insufficient documentation

## 2015-09-24 DIAGNOSIS — Z96649 Presence of unspecified artificial hip joint: Secondary | ICD-10-CM

## 2015-09-24 DIAGNOSIS — J9610 Chronic respiratory failure, unspecified whether with hypoxia or hypercapnia: Secondary | ICD-10-CM | POA: Insufficient documentation

## 2015-09-24 DIAGNOSIS — M978XXA Periprosthetic fracture around other internal prosthetic joint, initial encounter: Secondary | ICD-10-CM | POA: Insufficient documentation

## 2015-10-09 ENCOUNTER — Other Ambulatory Visit: Payer: Self-pay | Admitting: *Deleted

## 2015-10-09 DIAGNOSIS — J441 Chronic obstructive pulmonary disease with (acute) exacerbation: Secondary | ICD-10-CM

## 2015-10-10 MED ORDER — MONTELUKAST SODIUM 10 MG PO TABS: 10.0000 mg | ORAL_TABLET | Freq: Every day | ORAL | 2 refills | Status: DC

## 2015-10-12 ENCOUNTER — Telehealth: Payer: Self-pay | Admitting: Internal Medicine

## 2015-10-12 DIAGNOSIS — J449 Chronic obstructive pulmonary disease, unspecified: Secondary | ICD-10-CM

## 2015-10-12 NOTE — Telephone Encounter (Signed)
Ok to order [facility] schedule ONOX room air and 6 minute walk test as outpatient for dx COPD mixed type

## 2015-10-12 NOTE — Telephone Encounter (Signed)
Called and lmom for [name] to make her aware of order being placed for the ONOX on room air and this order has been placed.    I have called and lmomtcb for the pt to schedule a 6 min walk in our office for the pt.

## 2015-10-12 NOTE — Telephone Encounter (Signed)
Pt called back and she stated that she gets the oxygen from [facility] and they have supplied her a POC and tanks.    Pt stated that she is currently in a WC and she is not able to do the 6 min walk.  She stated that she is not able to get up and walk at all.  She stated that she was treated for a femur fx and has been in the [facility] since that time.    Pt stated that she does not need the ONOX or the 6 min walk.  Will forward back to CY to make him aware.

## 2015-10-12 NOTE — Telephone Encounter (Signed)
Noted  

## 2015-10-12 NOTE — Telephone Encounter (Signed)
Called and spoke with [place] from [place].  She stated that the resp therapist made a house visit to discuss with pt her neb machine and medications and the pt was wearing oxygen when she arrived.  The pt stated that she borrowed this from her best friends husband that is currently in the hospital.   Pt only gets the neb and albuterol for neb from [facility].  Pt stated that she used to have oxygen but it got taken back for some reason.    Pt stated that her sats drop when she is up and moving around and feels better on the oxygen.  She is using the oxygen 24/7.    Mandy wanted to make CY aware and to see if he wanted to set the pt up for ?ONO or set the pt up with an appt with CY to assess her oxygen issues.    CY please advise. Thanks  Last ov---04/13/15 No pending appt.

## 2015-10-22 ENCOUNTER — Other Ambulatory Visit: Payer: Self-pay | Admitting: *Deleted

## 2015-10-22 MED ORDER — OMEPRAZOLE 20 MG PO CPDR: DELAYED_RELEASE_CAPSULE | ORAL | 1 refills | Status: DC

## 2015-11-14 ENCOUNTER — Other Ambulatory Visit: Payer: Self-pay | Admitting: *Deleted

## 2015-11-14 MED ORDER — ALBUTEROL SULFATE HFA 108 (90 BASE) MCG/ACT IN AERS: INHALATION_SPRAY | RESPIRATORY_TRACT | 0 refills | Status: DC

## 2015-11-14 MED ORDER — FLUTICASONE PROPIONATE 50 MCG/ACT NA SUSP: 1.0000 | Freq: Two times a day (BID) | NASAL | 0 refills | Status: DC

## 2015-11-14 NOTE — Telephone Encounter (Signed)
Rx refilled for this once, patient has not been seen in over a year and will need to make appointment for future refills

## 2015-12-01 ENCOUNTER — Other Ambulatory Visit: Payer: Self-pay | Admitting: Internal Medicine

## 2015-12-03 NOTE — Telephone Encounter (Signed)
Ok to refill 

## 2015-12-03 NOTE — Telephone Encounter (Signed)
CY Please advise on Pred taper refill. Thanks.

## 2015-12-07 ENCOUNTER — Other Ambulatory Visit: Payer: Self-pay | Admitting: *Deleted

## 2015-12-07 DIAGNOSIS — I10 Essential (primary) hypertension: Secondary | ICD-10-CM

## 2015-12-08 MED ORDER — HYDROCHLOROTHIAZIDE 25 MG PO TABS: 25.0000 mg | ORAL_TABLET | Freq: Every day | ORAL | 0 refills | Status: DC

## 2015-12-08 MED ORDER — TIOTROPIUM BROMIDE MONOHYDRATE 18 MCG IN CAPS: ORAL_CAPSULE | RESPIRATORY_TRACT | 0 refills | Status: DC

## 2015-12-08 MED ORDER — FLUTICASONE PROPIONATE 50 MCG/ACT NA SUSP: 1.0000 | Freq: Two times a day (BID) | NASAL | 0 refills | Status: DC

## 2015-12-08 NOTE — Telephone Encounter (Signed)
Rx refilled for only 1 month. Patient has not been seen in over 1 year so will need to make appointment for more refills.

## 2015-12-11 ENCOUNTER — Other Ambulatory Visit: Payer: Self-pay | Admitting: *Deleted

## 2015-12-12 MED ORDER — ALBUTEROL SULFATE HFA 108 (90 BASE) MCG/ACT IN AERS: INHALATION_SPRAY | RESPIRATORY_TRACT | 0 refills | Status: DC

## 2015-12-12 NOTE — Telephone Encounter (Signed)
Rx refilled after speaking on the phone to the patient. She recently had hip surgery and is having transportation issues. She voices good understanding that this is the last refill before she is seen in our clinic. She is willing to speak pulmonology for lung issues in the meantime.

## 2015-12-12 NOTE — Telephone Encounter (Signed)
2nd request.  Martin, Tamika L, RN  

## 2015-12-24 NOTE — Telephone Encounter (Signed)
Spoke with patient and she states that she is about to have another hip surgery on November 1.  Said that she has already informed provider that once this has been completed she would call and schedule an appointment. Jazmin Hartsell,CMA

## 2016-01-28 ENCOUNTER — Other Ambulatory Visit: Payer: Self-pay | Admitting: Internal Medicine

## 2016-01-29 ENCOUNTER — Other Ambulatory Visit: Payer: Self-pay | Admitting: *Deleted

## 2016-01-29 DIAGNOSIS — J441 Chronic obstructive pulmonary disease with (acute) exacerbation: Secondary | ICD-10-CM

## 2016-02-04 ENCOUNTER — Other Ambulatory Visit: Payer: Self-pay | Admitting: *Deleted

## 2016-02-04 DIAGNOSIS — J441 Chronic obstructive pulmonary disease with (acute) exacerbation: Secondary | ICD-10-CM

## 2016-02-04 DIAGNOSIS — I10 Essential (primary) hypertension: Secondary | ICD-10-CM

## 2016-02-04 NOTE — Telephone Encounter (Signed)
I have spoken with this patient directly and the last time we spoke on the phone on 12/11/15 discussed that I had provided her with refills with the understanding that she would need to be seen for future refills.

## 2016-02-05 ENCOUNTER — Other Ambulatory Visit: Payer: Self-pay | Admitting: Internal Medicine

## 2016-02-05 DIAGNOSIS — I10 Essential (primary) hypertension: Secondary | ICD-10-CM

## 2016-02-05 DIAGNOSIS — J441 Chronic obstructive pulmonary disease with (acute) exacerbation: Secondary | ICD-10-CM

## 2016-02-06 ENCOUNTER — Other Ambulatory Visit: Payer: Self-pay | Admitting: *Deleted

## 2016-02-06 ENCOUNTER — Telehealth: Payer: Self-pay | Admitting: Internal Medicine

## 2016-02-06 DIAGNOSIS — J441 Chronic obstructive pulmonary disease with (acute) exacerbation: Secondary | ICD-10-CM

## 2016-02-06 DIAGNOSIS — I10 Essential (primary) hypertension: Secondary | ICD-10-CM

## 2016-02-06 MED ORDER — TIOTROPIUM BROMIDE MONOHYDRATE 18 MCG IN CAPS: ORAL_CAPSULE | RESPIRATORY_TRACT | 3 refills | Status: DC

## 2016-02-06 MED ORDER — MONTELUKAST SODIUM 10 MG PO TABS: 10.0000 mg | ORAL_TABLET | Freq: Every day | ORAL | 3 refills | Status: DC

## 2016-02-06 MED ORDER — HYDROCHLOROTHIAZIDE 25 MG PO TABS: 25.0000 mg | ORAL_TABLET | Freq: Every day | ORAL | 0 refills | Status: DC

## 2016-02-06 NOTE — Telephone Encounter (Signed)
Spoke with the pt  She states needing refills on Spiriva and Singulair  She states that she had to reschedule last appt due to having multiple hip surgeries  She is scheduled in March 2018 and states that she will keep this appt  I have refilled these meds to last until then  Nothing further needed

## 2016-02-15 ENCOUNTER — Encounter: Payer: Medicare Other | Admitting: Family Medicine

## 2016-02-27 ENCOUNTER — Other Ambulatory Visit: Payer: Self-pay | Admitting: *Deleted

## 2016-02-27 ENCOUNTER — Other Ambulatory Visit: Payer: Self-pay | Admitting: Internal Medicine

## 2016-02-27 MED ORDER — PREDNISONE 10 MG PO TABS: ORAL_TABLET | ORAL | 0 refills | Status: DC

## 2016-02-27 NOTE — Telephone Encounter (Signed)
Ok to [employer]

## 2016-02-27 NOTE — Telephone Encounter (Signed)
Patient request a refill of Prednisone. She has been coughing having nasal congestion for the past week. Nonproductive cough for now. She wants the Prednisone to prevent her from developing pneumonia. Is ok to refill her rx? Thanks!

## 2016-02-27 NOTE — Telephone Encounter (Signed)
Rx was sent in and the patient was notified.

## 2016-02-28 ENCOUNTER — Telehealth: Payer: Self-pay | Admitting: Internal Medicine

## 2016-02-28 NOTE — Telephone Encounter (Signed)
Called pharmacy and they stated that the pt. Stated she was suppose to have a rx for prednisone, but we have not seen the pt. Since feb. And no phone message since then says anything about pred. They stated they will call the pt. Informed them that they could have the pt. Call us if she is requesting this to get more information. Nothing further is needed at this time.

## 2016-02-29 ENCOUNTER — Telehealth: Payer: Self-pay | Admitting: Internal Medicine

## 2016-02-29 MED ORDER — PREDNISONE 10 MG PO TABS: ORAL_TABLET | ORAL | 0 refills | Status: DC

## 2016-02-29 NOTE — Telephone Encounter (Signed)
Spoke with pt. She is calling about a refill on prednisone. This was sent in on 02/27/16. Pt states that her pharmacy never received it. I have resent this prescription. Nothing further was needed.

## 2016-04-15 ENCOUNTER — Other Ambulatory Visit: Payer: Self-pay | Admitting: *Deleted

## 2016-04-15 MED ORDER — OMEPRAZOLE 20 MG PO CPDR: DELAYED_RELEASE_CAPSULE | ORAL | 1 refills | Status: DC

## 2016-04-24 NOTE — Progress Notes (Signed)
Subjective:  [name] is a 51 y.o. female who presents to the [facility] today with a chief complaint of cough with sputum production.   HPI: COPD  - Follows with pulmonology for COPD and is on 2L O2 at home. - States for the past 4 to 5 days has noticed some increased dyspnea compared to her baseline with cough and increased sputum production that started off whitish but now appears more yellow. Typical COPD exacerbation for her, usually gets steroid taper and antibiotics. - States is compliant with advair, singular, claritin, spiriva.  HTN - Takes HCTZ 25mg , tolerating well. No CP, HA.  Anxiety - States has been worse than usual lately and also dealing with claustrophobia. Major stressor in her life is her R hip pain with chronic infection and multiple surgeries. Is tearful and when asked questions, refuses to answer and only repeatedly asking provider to "hurry up so I can go home." - Does follow with [facility] who provides her celexa but per patient, has refused to prescribe xanax which is currently provided by her surgeon but who has also told her that no further refills will be provided and she only has a few pills left. Is not interested in counseling.  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 Is difficult to make it to doctor's appointments. Has upcoming appointment with orthopedic surgery at [facility] to discuss next steps for R hip revision, is unsure of when appointment is. - Follows with pain management for chronic pain. Takes oxycodone 20mg  BID, meloxicam 15mg  daily, gabapentin 300mg  daily.  Obesity - Has difficulty with healthy diet due to low income, becomes defensive when asked about diet at home and is refusing to discuss further today.   Health maintenance  Willing to go pharmacy for TDAP vaccine.  ROS: Per HPI  PMH: Per [facility] records review  in care everywhere Chronic R hip infection s/p multiple surgeries: R total hip arthroplasty 11/01/2014 with complicated removal of hip prosthesis 12/20/2014 and placement of spacer.  Cement spacer removed with revision of R total hip arthroplasty 05/15/2015. Spacer placementon 08/28/2015. Complicated by fracture of her proximal femur and fracture of the spacer stem July 2017 then treated with conversion to antibiotic-impregnated cement spacer due to R hip prosthetic joint infection with tissue cultures positive for Acinetobacter that required ID consult leading to ancef treatment via chronic PJI.  Smoking history reviewed.   Objective:  Physical Exam: BP 122/82   Pulse 84   Temp 97.9 F (36.6 C) (Oral)   Ht 5\' 1"  (1.549 m)   SpO2 94% Comment: @2L   Gen: Sitting in wheelchair, appears uncomfortable but in no distress.  CV: RRR with no murmurs appreciated Pulm: Poor air movement throughout with slight occasional expiratory wheezes. Normal effort on 2L nasal cannula in place. No rhonchi or crackles GI: Normal bowel sounds present. Soft, Nontender, Nondistended. MSK: R hip TTP. B/l legs with 2+ edema that is chronic for patient Skin: warm, dry Neuro: grossly normal, moves all extremities Psych:  is alternatingly tearful and angry.   Assessment/Plan:  Right hip pain Followed by [facility]. R total hip replacement in August 2016 with subsequent infection requiring antibiotic impregnated cement spacer in July 2017 and periprosthetic hip fracture. Ongoing management with surgical revision planned. Also follows with pain management clinic for chronic pain.  COPD (chronic obstructive pulmonary disease) (HCC) Given increased dyspnea, cough, purulence  in sputum production without rhonchi/crackles and appearing stable on 2L [place] which is patient baseline, likely starting to have mild COPD exacerbation. Offered patient prednisone burst vs taper, opted for prednisone taper. Also gave patient antibiotics given  severity of patient's COPD at baseline.  HYPERTENSION, BENIGN ESSENTIAL Well controlled, continue HCTZ.  ANXIETY DISORDER, GENERALIZED Follows with psychiatry and is currently on celexa. Appears very tearful with significant stressors in her life, may benefit from counseling. Agree with previous documentation that benzodiazepines are not safe in this patient. However patient is very anxious today and have discussed that perhaps finding counseling closer to her in [place] may be beneficial.  Morbid obesity due to excess calories (HCC) Patient is very resistant to discussing healthy diet and is unable to be active due to ongoing R hip issues with chronic pain and infection. Suspect that weight is a stressor for her and may be contributing to her other chronic medical problems i.e. mood, breathing, chronic pain.  Health maintenance  Patient is due for TDAP this month, have prescribed for patient to receive at pharmacy   Given multiple medical problems would like to see back sooner but patient sees multiple specialists and has difficulty with transportation, have discussed with patient and reached compromised that will see back in 6 months.  Leland Her, DO PGY-1, [facility] 04/24/2016 8:52 PM

## 2016-04-25 ENCOUNTER — Ambulatory Visit (INDEPENDENT_AMBULATORY_CARE_PROVIDER_SITE_OTHER): Payer: Medicare Other | Admitting: Family Medicine

## 2016-04-25 ENCOUNTER — Encounter: Payer: Self-pay | Admitting: Family Medicine

## 2016-04-25 VITALS — BP 122/82 | HR 84 | Temp 97.9°F | Ht 61.0 in

## 2016-04-25 DIAGNOSIS — F411 Generalized anxiety disorder: Secondary | ICD-10-CM | POA: Diagnosis not present

## 2016-04-25 DIAGNOSIS — I10 Essential (primary) hypertension: Secondary | ICD-10-CM

## 2016-04-25 DIAGNOSIS — Z23 Encounter for immunization: Secondary | ICD-10-CM

## 2016-04-25 DIAGNOSIS — J441 Chronic obstructive pulmonary disease with (acute) exacerbation: Secondary | ICD-10-CM | POA: Diagnosis present

## 2016-04-25 DIAGNOSIS — M25551 Pain in right hip: Secondary | ICD-10-CM

## 2016-04-25 MED ORDER — PREDNISONE 10 MG PO TABS: ORAL_TABLET | ORAL | 0 refills | Status: DC

## 2016-04-25 MED ORDER — TETANUS-DIPHTH-ACELL PERTUSSIS 5-2.5-18.5 LF-MCG/0.5 IM SUSP: 0.5000 mL | Freq: Once | INTRAMUSCULAR | 0 refills | Status: AC

## 2016-04-25 MED ORDER — DOXYCYCLINE HYCLATE 100 MG PO TABS: 100.0000 mg | ORAL_TABLET | Freq: Two times a day (BID) | ORAL | 0 refills | Status: AC

## 2016-04-25 NOTE — Patient Instructions (Addendum)
It was good to meet you today  For your COPD exacerbation. - Please take your prednisone taper (Take 4 tablets daily for 2 days, then 3 tablets daily for 2 days, then 2 tablets daily for 2 days and then 1 tablet daily for 2 days) and and antibiotic doxycycline (100mg  twice a day for 7 days).  Please get your tetanus vaccine at your pharmacy, I have sent it over electronically so you should be able to ask for it there.  I will see you back in 6 months.  Take care and seek immediate care sooner if you develop any concerns.   Dr. Leland HerElsia J Quincy Boy, DO [facility]

## 2016-04-25 NOTE — Assessment & Plan Note (Addendum)
Followed by [facility]. R total hip replacement in August 2016 with subsequent infection requiring antibiotic impregnated cement spacer in July 2017 and periprosthetic hip fracture. Ongoing management with surgical revision planned. Also follows with pain management clinic for chronic pain.

## 2016-04-26 NOTE — Assessment & Plan Note (Signed)
Follows with psychiatry and is currently on celexa. Appears very tearful with significant stressors in her life, may benefit from counseling. Agree with previous documentation that benzodiazepines are not safe in this patient. However patient is very anxious today and have discussed that perhaps finding counseling closer to her in [place] may be beneficial.

## 2016-04-26 NOTE — Assessment & Plan Note (Signed)
Given increased dyspnea, cough, purulence in sputum production without rhonchi/crackles and appearing stable on 2L [place] which is patient baseline, likely starting to have mild COPD exacerbation. Offered patient prednisone burst vs taper, opted for prednisone taper. Also gave patient antibiotics given severity of patient's COPD at baseline.

## 2016-04-26 NOTE — Assessment & Plan Note (Signed)
Patient is very resistant to discussing healthy diet and is unable to be active due to ongoing R hip issues with chronic pain and infection. Suspect that weight is a stressor for her and may be contributing to her other chronic medical problems i.e. mood, breathing, chronic pain.

## 2016-04-26 NOTE — Assessment & Plan Note (Signed)
Well controlled, continue HCTZ 

## 2016-05-06 ENCOUNTER — Other Ambulatory Visit: Payer: Self-pay | Admitting: Internal Medicine

## 2016-05-06 DIAGNOSIS — J441 Chronic obstructive pulmonary disease with (acute) exacerbation: Secondary | ICD-10-CM

## 2016-05-06 NOTE — Telephone Encounter (Signed)
Error

## 2016-05-12 ENCOUNTER — Encounter: Payer: Self-pay | Admitting: Internal Medicine

## 2016-05-12 ENCOUNTER — Telehealth: Payer: Self-pay | Admitting: Internal Medicine

## 2016-05-12 ENCOUNTER — Ambulatory Visit (INDEPENDENT_AMBULATORY_CARE_PROVIDER_SITE_OTHER): Payer: Medicare Other | Admitting: Internal Medicine

## 2016-05-12 VITALS — BP 126/74 | HR 111 | Ht 61.0 in | Wt 204.0 lb

## 2016-05-12 DIAGNOSIS — R918 Other nonspecific abnormal finding of lung field: Secondary | ICD-10-CM | POA: Diagnosis not present

## 2016-05-12 DIAGNOSIS — J302 Other seasonal allergic rhinitis: Secondary | ICD-10-CM

## 2016-05-12 DIAGNOSIS — Z720 Tobacco use: Secondary | ICD-10-CM | POA: Diagnosis not present

## 2016-05-12 DIAGNOSIS — J3089 Other allergic rhinitis: Secondary | ICD-10-CM | POA: Diagnosis not present

## 2016-05-12 DIAGNOSIS — J449 Chronic obstructive pulmonary disease, unspecified: Secondary | ICD-10-CM

## 2016-05-12 MED ORDER — METHYLPREDNISOLONE ACETATE 80 MG/ML IJ SUSP
80.0000 mg | Freq: Once | INTRAMUSCULAR | Status: AC
  Administered 2016-05-12: 80 mg via INTRAMUSCULAR

## 2016-05-12 MED ORDER — UMECLIDINIUM BROMIDE 62.5 MCG/INH IN AEPB: 1.0000 | INHALATION_SPRAY | Freq: Every day | RESPIRATORY_TRACT | 0 refills | Status: DC

## 2016-05-12 MED ORDER — UMECLIDINIUM BROMIDE 62.5 MCG/INH IN AEPB: 1.0000 | INHALATION_SPRAY | Freq: Every day | RESPIRATORY_TRACT | 12 refills | Status: DC

## 2016-05-12 NOTE — Assessment & Plan Note (Signed)
We reviewed her smoking history again, and her efforts at smoking cessation. She has cut down substantially by her report as verified by her son, and I hope she will pick up today to stay off.

## 2016-05-12 NOTE — Assessment & Plan Note (Signed)
There is chronic wheeze with coarse breath sounds consistent with an asthmatic bronchitis, which never really seems to clear. Ongoing smoking is an issue. This seems to be her baseline and she has tolerated a series of earlier hip procedures with general anesthesia. I expect a similar surgical course this time. We will help that we are restarting her LAMA inhaler Incruse. She will need to continue that and she will benefit from nebulized bronchodilators while in the hospital. I think she is as good as she is likely to get for necessary surgery.

## 2016-05-12 NOTE — Progress Notes (Signed)
HPI  female one pack per day smoker followed for asthma/COPD, seasonal allergic rhinitis Husband is a patient here ( blind and on dialysis)  Allergy skin test- significant positives for grass weed and tree pollens, dust mite, cockroach. Office Spirometry-08/19/2013-severe obstructive airways disease. FVC 2.05/71%, FEV1 1.04/42%, FEV1/FVC 0.51, FEF 25-75%  0.50/17% Office spirometry 07/06/14-severe obstructive airways disease. FVC 2.37/79%, FEV1 0.92/37%, FEV1/FVC 0.39, FEF 25-75 percent 0.26/9%  -----------------------------------------------------------------------------------------  04/13/2015- 51 year old female one pack per day smoker followed for asthma/COPD, seasonal allergic rhinitis finished zpak and prednisone approx 3 wks ago.  Over the past 2-3 days, c/o congestion, cough with clear to yellow mucus, increased DOE, and wheezing.  Temp 100 x 2 night ago.  Still on vaccine. Allergy vaccine 1:50 GO She has reduced her cigarette smoking to a few cigarettes daily. Did not do well with Chantix in the past. Blames weather for increased cough and wheeze recently. She got better for a little while after Z-Pak and prednisone and feels this would be the best approach to repeat. She is awaiting revision of hip replacement surgery. She is uncomfortable sitting because of her hip and anxious to get home.  05/12/2016- 51 year old female one pack per day smoker followed for asthma/COPD, seasonal allergic rhinitis Allergy Vaccine discontinued 03/09/2016 when our program closed. Lung nodules left lower lobe noted on CT chest 04/04/2013, but not on subsequent CXR. FOLLOWS FOR:Pt states her insurance will not cover Spiriva anymore-will need to change. Pt states her breathing has been pretty good-currently on O2 now. Pt notices that her breathing gets better when she is able to get up and move around more;cough-white and thick. Increased congestion. Pt needs surgery clearance-hip surgery. She has had a series of  right hip surgeries complicated by infection and repair. Hopefully one scheduled at the end of this month will be the last. She denies respiratory or anesthesia problems with any of these procedures. Had continued to smoke one pack per day. With discussion, she says Chantix "made me mean". Son with her, confirms she has not bought any cigarettes since last fall and has been abstinent recently. Daily white sputum with no recent infection. Blames weather change. Uses nebulizer 3 or 4 times per day. Insurance no longer cover Spiriva and she thinks replacing that will help a lot.  ROS-see HPI Constitutional:   No-   weight loss, night sweats, fevers, chills, fatigue, lassitude. HEENT:   No-  headaches, difficulty swallowing, tooth/dental problems, no-sore throat,       No- sneezing, no-itching, ear ache, +nasal congestion,+ post nasal drip,  CV:  No-   chest pain, orthopnea, PND,+ swelling in lower extremities, no-anasarca,                                                       dizziness, palpitations Resp: + shortness of breath with exertion or at rest.             + productive cough, + non-productive cough,  No- coughing up of blood.              No-   change in color of mucus.  No- wheezing.   Skin: No-   rash or lesions. GI:  + heartburn, indigestion, No-abdominal pain, nausea, vomiting,  GU:  MS:  + joint pain or swelling.   Neuro-  nothing unusual Psych:  No- change in mood or affect. + depression or anxiety.  No memory loss.  OBJ- Physical Exam General- Alert, Oriented, Affect-appropriate, Distress- none acute. + Overweight, + talkative Skin- rash-none, lesions- none, excoriation- none Lymphadenopathy- none Head- atraumatic            Eyes- Gross vision intact, PERRLA, conjunctivae and secretions clear            Ears- Hearing, canals-normal            Nose- + turbinate edema, no-Septal dev, +mucus bridging, polyps, erosion, perforation             Throat- Mallampati III , mucosa  clear , drainage- none, tonsils- atrophic, + dentures,  Neck- flexible , trachea midline, no stridor , thyroid nl, carotid no bruit Chest - symmetrical excursion , unlabored           Heart/CV- RRR , no murmur , no gallop  , no rub, nl s1 s2                           - JVD- none , edema- none, stasis changes- none, varices- none           Lung-  wheeze + coarse/unlabored, cough+ upper airway rattle,  dullness-none, rub- none           Chest wall-  Abd-  Br/ Gen/ Rectal- Not done, not indicated Extrem- cyanosis- none, clubbing, none, atrophy- none, strength- nl, + wheelchair Neuro- grossly intact to observation

## 2016-05-12 NOTE — Assessment & Plan Note (Signed)
Not clear the chest x-ray could resolve these left lower lobe nodules. She has a high risk smoking history. Plan-CT chest no contrast

## 2016-05-12 NOTE — Patient Instructions (Signed)
Sample Incruse inhale 1 puff, once daily     Script already sent   This replaces Spiriva  Order- schedule CT chest no contrast   Dx lung nodules on CT abdomen 2015  Order- Depo 80      Dx exacerbation COPD  It is really great that you are trying to stay off the cigarettes Keep up the good work  !!  Please call as needed

## 2016-05-12 NOTE — Telephone Encounter (Signed)
Per CY-okay to change to Incruse #1 inhale 1 puff once daily with 12 refills. Pharmacy to show patient how to use Inhaler.

## 2016-05-12 NOTE — Assessment & Plan Note (Signed)
She is using up the last of her allergy vaccine which she believes has helped. Plan-I have suggested that she seek allergy care as needed, in [place] since our program has closed.

## 2016-06-09 ENCOUNTER — Other Ambulatory Visit: Payer: Self-pay | Admitting: Internal Medicine

## 2016-06-09 DIAGNOSIS — J441 Chronic obstructive pulmonary disease with (acute) exacerbation: Secondary | ICD-10-CM

## 2016-06-12 ENCOUNTER — Telehealth: Payer: Self-pay | Admitting: Internal Medicine

## 2016-06-12 MED ORDER — ALBUTEROL SULFATE HFA 108 (90 BASE) MCG/ACT IN AERS: 2.0000 | INHALATION_SPRAY | Freq: Four times a day (QID) | RESPIRATORY_TRACT | 6 refills | Status: DC | PRN

## 2016-06-12 NOTE — Telephone Encounter (Signed)
rx has been sent to [facility]. Nothing more needed at this time.

## 2016-06-24 ENCOUNTER — Inpatient Hospital Stay: Admission: RE | Admit: 2016-06-24 | Payer: Medicare Other | Source: Ambulatory Visit

## 2016-06-25 ENCOUNTER — Other Ambulatory Visit: Payer: Self-pay | Admitting: *Deleted

## 2016-06-25 NOTE — Telephone Encounter (Signed)
Patient needs to be evaluated for COPD exacerbation. Or she could always ask her pulmonologist.

## 2016-06-26 NOTE — Telephone Encounter (Signed)
Contacted pt- pt stated she did not mean to put in rx request for predinsone.

## 2016-07-01 ENCOUNTER — Telehealth: Payer: Self-pay | Admitting: Family Medicine

## 2016-07-01 NOTE — Telephone Encounter (Signed)
Spoke with patient and offered appointment on May 3 for [facility] clinic with me. Patient stated she would consider and call back to front office if she decided to come in. However, she states she is dealing with "a lot of things" right now and may not be able to make it.

## 2016-07-02 ENCOUNTER — Telehealth: Payer: Self-pay | Admitting: Internal Medicine

## 2016-07-02 MED ORDER — AZITHROMYCIN 250 MG PO TABS: ORAL_TABLET | ORAL | 0 refills | Status: DC

## 2016-07-02 MED ORDER — PREDNISONE 10 MG PO TABS: ORAL_TABLET | ORAL | 0 refills | Status: DC

## 2016-07-02 NOTE — Telephone Encounter (Signed)
Called spoke with patient and discussed CY's recommendations with her  Pt okay with these recs and voiced her understanding Rx sent to verified pharmacy Pt aware to contact the office if her symptoms do not improve or they worsen Nothing further needed; will sign off

## 2016-07-02 NOTE — Telephone Encounter (Signed)
Ok prednisone 10 mg, # 20    4 X 2 DAYS, 3 X 2 DAYS, 2 X 2 DAYS, 1 X 2 DAYS  Also offer Z pak to carry if she wants to have it available

## 2016-07-02 NOTE — Telephone Encounter (Signed)
Pt is requesting Rx of prednisone. Pt reports of increased sob, chest congestion & prod cough with yellow mucus x2d pt states she is leaving today within the next 2hr to go out of town for her mother's funeral. Pt is concerns that this may turn into PNA if not treated with prednisone. Pt not taking any OTC medications to help with symptoms    CY please advise. Thanks  Current Outpatient Prescriptions on File Prior to Visit  Medication Sig Dispense Refill  . ADVAIR DISKUS 500-50 MCG/DOSE AEPB INHALE 1 PUFF INTO THE LUNGS TWICE DAILY 60 each 11  . albuterol (PROVENTIL HFA;VENTOLIN HFA) 108 (90 Base) MCG/ACT inhaler Inhale 2 puffs into the lungs every 6 (six) hours as needed for wheezing or shortness of breath. 1 Inhaler 6  . albuterol (PROVENTIL) (2.5 MG/3ML) 0.083% nebulizer solution USE 1 VIAL IN NEBULIZER EVERY 4 HOURS AS NEEDED 75 vial prn  . ALPRAZolam (XANAX) 0.5 MG tablet Take 1 tablet by mouth 2 (two) times daily as needed.     . citalopram (CELEXA) 20 MG tablet Take 1 tablet by mouth at bedtime.    Marland Kitchen EPINEPHrine (EPI-PEN) 0.3 mg/0.3 mL DEVI Inject 0.3 mLs (0.3 mg total) into the muscle once. 1 Device prn  . ferrous sulfate 325 (65 FE) MG EC tablet Take 1 tablet by mouth 2 (two) times daily.    . fluticasone (FLONASE) 50 MCG/ACT nasal spray USE 1 SPRAY IN EACH NOSTRIL TWICE A DAY 16 g 2  . gabapentin (NEURONTIN) 300 MG capsule Take 1 capsule by mouth at bedtime.    . hydrochlorothiazide (HYDRODIURIL) 25 MG tablet Take 1 tablet (25 mg total) by mouth daily. 30 tablet 0  . lamoTRIgine (LAMICTAL) 150 MG tablet Take 1 tablet (150 mg total) by mouth daily. 30 tablet 0  . loratadine (CLARITIN) 10 MG tablet Take 1 tablet (10 mg total) by mouth daily. 30 tablet 11  . meloxicam (MOBIC) 15 MG tablet Take 15 mg by mouth daily.    . montelukast (SINGULAIR) 10 MG tablet Take 1 tablet (10 mg total) by mouth at bedtime. 30 tablet 3  . montelukast (SINGULAIR) 10 MG tablet TAKE 1 TABLET BY MOUTH AT  BEDTIME. 30 tablet 0  . omeprazole (PRILOSEC) 20 MG capsule TAKE 1 CAPSULE ONCE A DAY. 90 capsule 1  . oxyCODONE (OXYCONTIN) 20 mg 12 hr tablet Take by mouth.    . OXYGEN Place 2 L/min into the nose as directed. Oxygen via [place] @@ 2L/min on exertion with stationary concentrator and portable gas tank.    . TRAVATAN Z 0.004 % SOLN ophthalmic solution Place 1 drop into both eyes at bedtime.    Marland Kitchen umeclidinium bromide (INCRUSE ELLIPTA) 62.5 MCG/INH AEPB Inhale 1 puff into the lungs daily. 1 each 12  . umeclidinium bromide (INCRUSE ELLIPTA) 62.5 MCG/INH AEPB Inhale 1 puff into the lungs daily. 1 each 0   No current facility-administered medications on file prior to visit.     Allergies  Allergen Reactions  . Acetaminophen Nausea Only  . Asa [Aspirin]   . Hydrocodone-Acetaminophen     REACTION: itching  . Morphine And Related   . Sulfamethoxazole-Trimethoprim Rash

## 2016-07-09 ENCOUNTER — Other Ambulatory Visit: Payer: Self-pay | Admitting: Internal Medicine

## 2016-07-09 DIAGNOSIS — J441 Chronic obstructive pulmonary disease with (acute) exacerbation: Secondary | ICD-10-CM

## 2016-08-25 ENCOUNTER — Other Ambulatory Visit: Payer: Self-pay | Admitting: Internal Medicine

## 2016-08-25 NOTE — Telephone Encounter (Signed)
Ok to refill 

## 2016-08-25 NOTE — Telephone Encounter (Signed)
CY please advise on refill. Thanks. 

## 2016-09-08 ENCOUNTER — Other Ambulatory Visit: Payer: Self-pay | Admitting: Internal Medicine

## 2016-09-08 MED ORDER — FLUTICASONE-SALMETEROL 500-50 MCG/DOSE IN AEPB: INHALATION_SPRAY | RESPIRATORY_TRACT | 3 refills | Status: DC

## 2016-09-08 NOTE — Telephone Encounter (Signed)
Rx for advair and flonase sent. Will not prescribe ambien since not on patient's med list and per chart review has not been prescribed this by us for years. In addition patient would be a very poor candidate [place].

## 2016-09-09 ENCOUNTER — Telehealth: Payer: Self-pay | Admitting: Internal Medicine

## 2016-09-09 MED ORDER — PREDNISONE 10 MG PO TABS: ORAL_TABLET | ORAL | 0 refills | Status: DC

## 2016-09-09 MED ORDER — DOXYCYCLINE HYCLATE 100 MG PO TABS: ORAL_TABLET | ORAL | 0 refills | Status: DC

## 2016-09-09 NOTE — Telephone Encounter (Signed)
Spoke with pt and informed her of CY's message. Pt agreed to the medication being called in. Rx was sent to her pharmacy of choice. She had no further questions at this time. She is aware that our office will be closed tomorrow if anything changes. Nothing further is needed

## 2016-09-09 NOTE — Telephone Encounter (Signed)
Pt c/o increased sob, prod cough with green mucus, low grade temp (99.3) Xfew days.   Denies chest pain, sinus congestion.   Pt has been taking otc cough syrup to help with cough.    Requesting abx and a pred taper.    [facility]    Last ov: 05/12/2016 Next ov: 11/13/16  CY please advise on recs.  Thanks!

## 2016-09-09 NOTE — Telephone Encounter (Signed)
Doxycycline 100 mg, # 8, 2 today then one daily  Prednisone 10 mg, # 20, 4 X 2 DAYS, 3 X 2 DAYS, 2 X 2 DAYS, 1 X 2 DAYS

## 2016-10-09 ENCOUNTER — Other Ambulatory Visit: Payer: Self-pay | Admitting: Internal Medicine

## 2016-10-09 DIAGNOSIS — J441 Chronic obstructive pulmonary disease with (acute) exacerbation: Secondary | ICD-10-CM

## 2016-11-07 ENCOUNTER — Other Ambulatory Visit: Payer: Self-pay | Admitting: *Deleted

## 2016-11-11 MED ORDER — OMEPRAZOLE 20 MG PO CPDR: DELAYED_RELEASE_CAPSULE | ORAL | 1 refills | Status: DC

## 2016-11-11 MED ORDER — IBUPROFEN 800 MG PO TABS: 800.0000 mg | ORAL_TABLET | Freq: Three times a day (TID) | ORAL | 0 refills | Status: DC | PRN

## 2016-11-13 ENCOUNTER — Ambulatory Visit: Payer: Medicare Other | Admitting: Internal Medicine

## 2016-12-04 ENCOUNTER — Other Ambulatory Visit: Payer: Self-pay | Admitting: Internal Medicine

## 2016-12-05 NOTE — Telephone Encounter (Signed)
Received Rx request of pred taper. Called pt regarding this request and she stated to me that she is wanting the taper refilled due to currently being congested and wants to be started on this before symptoms increase.  Dr. Maple Hudson, please advise if it is okay for [place] to refill this for pt.  Thanks!  Allergies  Allergen Reactions  . Acetaminophen Nausea Only  . Asa [Aspirin]   . Hydrocodone-Acetaminophen     REACTION: itching  . Morphine And Related   . Sulfamethoxazole-Trimethoprim Rash    Current Outpatient Prescriptions:  .  albuterol (PROVENTIL HFA;VENTOLIN HFA) 108 (90 Base) MCG/ACT inhaler, Inhale 2 puffs into the lungs every 6 (six) hours as needed for wheezing or shortness of breath., Disp: 1 Inhaler, Rfl: 6 .  albuterol (PROVENTIL) (2.5 MG/3ML) 0.083% nebulizer solution, USE 1 VIAL IN NEBULIZER EVERY 4 HOURS AS NEEDED, Disp: 75 vial, Rfl: prn .  ALPRAZolam (XANAX) 0.5 MG tablet, Take 1 tablet by mouth 2 (two) times daily as needed. , Disp: , Rfl:  .  azithromycin (ZITHROMAX) 250 MG tablet, Take as directed, Disp: 6 tablet, Rfl: 0 .  citalopram (CELEXA) 20 MG tablet, Take 1 tablet by mouth at bedtime., Disp: , Rfl:  .  doxycycline (VIBRA-TABS) 100 MG tablet, Take 2 tabs today then 1 daily, Disp: 8 tablet, Rfl: 0 .  EPINEPHrine (EPI-PEN) 0.3 mg/0.3 mL DEVI, Inject 0.3 mLs (0.3 mg total) into the muscle once., Disp: 1 Device, Rfl: prn .  ferrous sulfate 325 (65 FE) MG EC tablet, Take 1 tablet by mouth 2 (two) times daily., Disp: , Rfl:  .  fluticasone (FLONASE) 50 MCG/ACT nasal spray, INSTILL 1 SPRAY INTO EACH NOSTRIL TWICE DAILY, Disp: 16 g, Rfl: 2 .  Fluticasone-Salmeterol (ADVAIR DISKUS) 500-50 MCG/DOSE AEPB, INHALE 1 PUFF INTO THE LUNGS TWICE DAILY, Disp: 60 each, Rfl: 3 .  gabapentin (NEURONTIN) 300 MG capsule, Take 1 capsule by mouth at bedtime., Disp: , Rfl:  .  hydrochlorothiazide (HYDRODIURIL) 25 MG tablet, Take 1 tablet (25 mg total) by mouth daily., Disp: 30 tablet, Rfl: 0 .   ibuprofen (ADVIL,MOTRIN) 800 MG tablet, Take 1 tablet (800 mg total) by mouth every 8 (eight) hours as needed., Disp: 30 tablet, Rfl: 0 .  lamoTRIgine (LAMICTAL) 150 MG tablet, Take 1 tablet (150 mg total) by mouth daily., Disp: 30 tablet, Rfl: 0 .  loratadine (CLARITIN) 10 MG tablet, Take 1 tablet (10 mg total) by mouth daily., Disp: 30 tablet, Rfl: 11 .  meloxicam (MOBIC) 15 MG tablet, Take 15 mg by mouth daily., Disp: , Rfl:  .  montelukast (SINGULAIR) 10 MG tablet, TAKE 1 TABLET BY MOUTH AT BEDTIME., Disp: 30 tablet, Rfl: 2 .  omeprazole (PRILOSEC) 20 MG capsule, TAKE 1 CAPSULE ONCE A DAY., Disp: 90 capsule, Rfl: 1 .  oxyCODONE (OXYCONTIN) 20 mg 12 hr tablet, Take by mouth., Disp: , Rfl:  .  OXYGEN, Place 2 L/min into the nose as directed. Oxygen via [place] @@ 2L/min on exertion with stationary concentrator and portable gas tank., Disp: , Rfl:  .  predniSONE (DELTASONE) 10 MG tablet, TAKE 4 TABLETS FOR 2 DAYS, 3 TABLETS FOR 2 DAYS, 2 TABLETS FOR 2 DAYS, 1 TABLET FOR 2 DAYS, THEN STOP., Disp: 20 tablet, Rfl: 0 .  predniSONE (DELTASONE) 10 MG tablet, Take 4 tabs for 2 days, then 3 tabs for 2 days, then 2 tabs for 2 days, then 1 tab for 2 days, Disp: 20 tablet, Rfl: 0 .  TRAVATAN Z  0.004 % SOLN ophthalmic solution, Place 1 drop into both eyes at bedtime., Disp: , Rfl:  .  umeclidinium bromide (INCRUSE ELLIPTA) 62.5 MCG/INH AEPB, Inhale 1 puff into the lungs daily., Disp: 1 each, Rfl: 0

## 2016-12-05 NOTE — Telephone Encounter (Signed)
Ok to refill as requested 

## 2017-01-09 ENCOUNTER — Other Ambulatory Visit: Payer: Self-pay | Admitting: Internal Medicine

## 2017-01-09 DIAGNOSIS — J441 Chronic obstructive pulmonary disease with (acute) exacerbation: Secondary | ICD-10-CM

## 2017-01-12 NOTE — Telephone Encounter (Signed)
CY Please advise on Prednisone taper rx refill request. Thanks.

## 2017-01-12 NOTE — Telephone Encounter (Signed)
Ok refill prednisone x 2.   Refill ventolin and montelukast x 12 months

## 2017-02-17 ENCOUNTER — Telehealth: Payer: Self-pay | Admitting: Family Medicine

## 2017-02-17 NOTE — Telephone Encounter (Signed)
Left message for patient that she would be a great candidate for my [facility] clinic on 03/16/17 and to please call back if interested. If she calls back for this please help in scheduling.

## 2017-03-27 ENCOUNTER — Ambulatory Visit (INDEPENDENT_AMBULATORY_CARE_PROVIDER_SITE_OTHER): Payer: Medicare Other | Admitting: Internal Medicine

## 2017-03-27 ENCOUNTER — Encounter: Payer: Self-pay | Admitting: Internal Medicine

## 2017-03-27 DIAGNOSIS — J441 Chronic obstructive pulmonary disease with (acute) exacerbation: Secondary | ICD-10-CM | POA: Diagnosis not present

## 2017-03-27 DIAGNOSIS — R918 Other nonspecific abnormal finding of lung field: Secondary | ICD-10-CM

## 2017-03-27 DIAGNOSIS — Z720 Tobacco use: Secondary | ICD-10-CM

## 2017-03-27 MED ORDER — AMOXICILLIN 500 MG PO TABS: 500.0000 mg | ORAL_TABLET | Freq: Two times a day (BID) | ORAL | 0 refills | Status: DC

## 2017-03-27 MED ORDER — VARENICLINE TARTRATE 0.5 MG X 11 & 1 MG X 42 PO MISC: ORAL | 1 refills | Status: DC

## 2017-03-27 MED ORDER — METHYLPREDNISOLONE ACETATE 80 MG/ML IJ SUSP
80.0000 mg | Freq: Once | INTRAMUSCULAR | Status: AC
  Administered 2017-03-27: 80 mg via INTRAMUSCULAR

## 2017-03-27 NOTE — Progress Notes (Signed)
HPI female one pack per day smoker followed for COPD mixed type, lung nodules, seasonal allergic rhinitis, tobacco abuse, complicated by hip surgery/infection/repair, anxiety/depression, GERD, HBP, obesity, Husband is a patient here ( blind and on dialysis)  Allergy skin test- significant positives for grass weed and tree pollens, dust mite, cockroach. Office Spirometry-08/19/2013-severe obstructive airways disease. FVC 2.05/71%, FEV1 1.04/42%, FEV1/FVC 0.51, FEF 25-75%  0.50/17% Office spirometry 07/06/14-severe obstructive airways disease. FVC 2.37/79%, FEV1 0.92/37%, FEV1/FVC 0.39, FEF 25-75 percent 0.26/9%  -----------------------------------------------------------------------------------------  05/12/2016- 52 year old female one pack per day smoker followed for asthma/COPD, seasonal allergic rhinitis Allergy Vaccine discontinued 03/09/2016 when our program closed. Lung nodules left lower lobe noted on CT chest 04/04/2013, but not on subsequent CXR. FOLLOWS FOR:Pt states her insurance will not cover Spiriva anymore-will need to change. Pt states her breathing has been pretty good-currently on O2 now. Pt notices that her breathing gets better when she is able to get up and move around more;cough-white and thick. Increased congestion. Pt needs surgery clearance-hip surgery. She has had a series of right hip surgeries complicated by infection and repair. Hopefully one scheduled at the end of this month will be the last. She denies respiratory or anesthesia problems with any of these procedures. Had continued to smoke one pack per day. With discussion, she says Chantix "made me mean". Son with her, confirms she has not bought any cigarettes since last fall and has been abstinent recently. Daily white sputum with no recent infection. Blames weather change. Uses nebulizer 3 or 4 times per day. Insurance no longer cover Spiriva and she thinks replacing that will help a lot.  03/27/17- 52 year old female  one pack per day smoker followed for COPD mixed type, lung nodules, seasonal allergic rhinitis, tobacco abuse, complicated by hip surgery/infection/repair, anxiety/depression, GERD, HBP, obesity, O2 2 L ----Pt received flu shot on monday and has felt more congested/more coughing ever since. she is using nebulizer txs every 4 hours to help with symptoms.  CT chest 05/12/16-never done Starting the day or 2 after flu shot she has felt increased head and chest congestion with green mucus from nose and chest but no distinct fever.  No blood or chest pain.  Feels need for continuous oxygen now.  Establish a new DME.  Rescue inhaler 2 or 3 times daily and continues Incruse, Advair, Singulair.  Off prednisone. Need to update lung nodule status  ROS-see HPI     + = positive Constitutional:   No-   weight loss, night sweats, fevers, chills, fatigue, lassitude. HEENT:   No-  headaches, difficulty swallowing, tooth/dental problems, no-sore throat,       No- sneezing, no-itching, ear ache, +nasal congestion,+ post nasal drip,  CV:  No-   chest pain, orthopnea, PND,+ swelling in lower extremities, no-anasarca,                                                       dizziness, palpitations Resp: + shortness of breath with exertion or at rest.             + productive cough, + non-productive cough,  No- coughing up of blood.              No-   change in color of mucus.  No- wheezing.   Skin: No-   rash or lesions. GI:  +  heartburn, indigestion, No-abdominal pain, nausea, vomiting,  GU:  MS:  + joint pain or swelling.   Neuro-     nothing unusual Psych:  No- change in mood or affect. + depression or anxiety.  No memory loss.  OBJ- Physical Exam General- Alert, Oriented, Affect-appropriate, Distress- none acute. + Overweight, + talkative + wheelchair,                                     +O2 Skin- rash-none, lesions- none, excoriation- none Lymphadenopathy- none Head- atraumatic            Eyes- Gross vision  intact, PERRLA, conjunctivae and secretions clear            Ears- Hearing, canals-normal            Nose- + turbinate edema, no-Septal dev, +mucus bridging, polyps, erosion, perforation             Throat- Mallampati III , mucosa clear , drainage- none, tonsils- atrophic, + dentures,  Neck- flexible , trachea midline, no stridor , thyroid nl, carotid no bruit Chest - symmetrical excursion , unlabored           Heart/CV- RRR , no murmur , no gallop  , no rub, nl s1 s2                           - JVD- none , edema- none, stasis changes- none, varices- none           Lung-  wheeze + coarse/unlabored, cough+ upper airway rattle,  dullness-none, rub- none           Chest wall-  Abd-  Br/ Gen/ Rectal- Not done, not indicated Extrem- cyanosis- none, clubbing, none, atrophy- none, strength- nl, + wheelchair Neuro- grossly intact to observation

## 2017-03-27 NOTE — Patient Instructions (Addendum)
Order- CT chest no contrast      Dx lung nodules, tobacco user  Depo 80    Dx exacerbation COPD  Script for Chantix  Script for amoxacillin sent

## 2017-03-28 ENCOUNTER — Encounter: Payer: Self-pay | Admitting: Internal Medicine

## 2017-03-28 NOTE — Assessment & Plan Note (Signed)
Likely infection rather than reaction to flu shot.   Plan -amoxicillin medication talk

## 2017-03-28 NOTE — Assessment & Plan Note (Signed)
Asks to try Chantix again-discussed Plan-Chantix with strong support for smoking cessation

## 2017-03-28 NOTE — Assessment & Plan Note (Signed)
Last ordered CT not done because hospitalized for hip surgery. Plan-CT chest for lung nodules

## 2017-03-31 ENCOUNTER — Telehealth: Payer: Self-pay | Admitting: Internal Medicine

## 2017-03-31 NOTE — Telephone Encounter (Signed)
atc pt X2, line rang to fast busy signal. [facility] info to our office.  Will await fax.

## 2017-03-31 NOTE — Telephone Encounter (Signed)
PA received from pharmacy.   Initiated via [email or web address].  Key: [id number]. Determination expected within 5 business days.  Will hold for follow-up.

## 2017-04-01 ENCOUNTER — Inpatient Hospital Stay: Admission: RE | Admit: 2017-04-01 | Payer: Medicare Other | Source: Ambulatory Visit

## 2017-04-01 NOTE — Telephone Encounter (Signed)
Plan has not updated PA status as of today. Will keep this note open until a determination has been made.

## 2017-04-03 NOTE — Telephone Encounter (Signed)
Kelly FrancoisNancy Skolnik (Key: [id number])  Need help? Call us at [phone]  Status  Sent to [employer] January 22  Next Steps  The plan will fax you a determination, typically within 1 to 5 business days. How do I follow up?

## 2017-04-07 NOTE — Telephone Encounter (Signed)
Kelly FrancoisNancy [name] (Key: [id number]) Need help? Call us at [phone] Status Sent to [employer] January 22 Next Steps The plan will fax you a determination, typically within 1 to 5 business days.  How do I follow up?   No determination as of today.

## 2017-04-08 ENCOUNTER — Telehealth: Payer: Self-pay

## 2017-04-08 NOTE — Telephone Encounter (Signed)
Spoke to [employer] who stated that patient is approved from back dated 03/08/2017 to 03/09/2018 for Chantix. Called and left message for patient to let her know of approval. No further actions needed at this time.

## 2017-04-08 NOTE — Telephone Encounter (Signed)
Checked covermymeds today to see if we had any updated info and it still says on there that the outcome is unknown. Will await a fax or a determination on covermymeds on pt's med.

## 2017-04-09 NOTE — Telephone Encounter (Signed)
[facility] follow up on

## 2017-04-13 ENCOUNTER — Other Ambulatory Visit: Payer: Self-pay

## 2017-04-14 ENCOUNTER — Inpatient Hospital Stay: Admission: RE | Admit: 2017-04-14 | Payer: Medicare Other | Source: Ambulatory Visit

## 2017-04-14 MED ORDER — FLUTICASONE-SALMETEROL 500-50 MCG/DOSE IN AEPB: INHALATION_SPRAY | RESPIRATORY_TRACT | 3 refills | Status: DC

## 2017-04-15 NOTE — Telephone Encounter (Signed)
Tana Feltsillman, Bettie J, RN     04/08/17 10:20 AM  Note    Spoke to [employer] who stated that patient is approved from back dated 03/08/2017 to 03/09/2018 for Chantix. Called and left message for patient to let her know of approval. No further actions needed at this time.

## 2017-04-23 ENCOUNTER — Ambulatory Visit (INDEPENDENT_AMBULATORY_CARE_PROVIDER_SITE_OTHER)
Admission: RE | Admit: 2017-04-23 | Discharge: 2017-04-23 | Disposition: A | Discharge status: Home / Self Care | Payer: Medicare Other | Source: Ambulatory Visit | Attending: Internal Medicine | Admitting: Internal Medicine

## 2017-04-23 DIAGNOSIS — R918 Other nonspecific abnormal finding of lung field: Secondary | ICD-10-CM | POA: Diagnosis not present

## 2017-04-24 ENCOUNTER — Telehealth: Payer: Self-pay | Admitting: Internal Medicine

## 2017-04-24 ENCOUNTER — Telehealth: Payer: Self-pay | Admitting: *Deleted

## 2017-04-24 MED ORDER — DOXYCYCLINE HYCLATE 100 MG PO TABS: ORAL_TABLET | ORAL | 0 refills | Status: DC

## 2017-04-24 NOTE — Telephone Encounter (Signed)
Ok to offer doxycycline 100 mg, # 8, 2 today then one daily  If she wants to see an allergist, we had told her about [facility], in [place] (Dr Kathyrn LassKozlow's group)

## 2017-04-24 NOTE — Telephone Encounter (Signed)
Notes recorded by Cydney OkAugustin, Tamara N, CMA on 04/24/2017 at 9:48 AM EST Pt wanted to ask CY about the allergist in [place] he recommended. She forgot the name of the doctor.  She states she is coughing and feels like she has an infection. She is coughing up green colored mucus and request a medication to be called in. She is wheezing but denies fever and body aches. Please advise. ------  Notes recorded by Cydney OkAugustin, Tamara N, CMA on 04/24/2017 at 9:40 AM EST Advised pt of results. Pt understood and nothing further is needed.   ------  Notes recorded by Waymon BudgeYoung, Clinton D, MD on 04/23/2017 at 11:32 AM EST CT chest- small nodule in left lung has been stable. Because of smoking history, we recommend repeat CT chest no contrast, in 12 months for dx lung nodule.

## 2017-04-24 NOTE — Telephone Encounter (Signed)
Medication prescribed and sent to pharmacy nothing further needed.

## 2017-04-24 NOTE — Telephone Encounter (Signed)
It appears that Rx was sent to incorrect pharmacy. Rx for doxy has been sent to preferred pharmacy. Pt is aware and voiced her understanding Nothing further is needed.

## 2017-05-12 ENCOUNTER — Other Ambulatory Visit: Payer: Self-pay

## 2017-05-12 MED ORDER — OMEPRAZOLE 20 MG PO CPDR: DELAYED_RELEASE_CAPSULE | ORAL | 0 refills | Status: DC

## 2017-05-15 ENCOUNTER — Other Ambulatory Visit: Payer: Self-pay

## 2017-05-15 MED ORDER — OMEPRAZOLE 20 MG PO CPDR: DELAYED_RELEASE_CAPSULE | ORAL | 0 refills | Status: DC

## 2017-05-15 NOTE — Telephone Encounter (Signed)
[name] with [facility] called- states they did not receive the refill for patients omeprazole on 05/12/17 and would like to have it resent. Shawna OrleansMeredith B Thomsen, RN

## 2017-05-19 ENCOUNTER — Telehealth: Payer: Self-pay | Admitting: Internal Medicine

## 2017-05-19 NOTE — Telephone Encounter (Signed)
lmtcb x1 for pt. 

## 2017-05-19 NOTE — Telephone Encounter (Signed)
Offer doxycycline 100 mg, # 8, 2 today then one daily 

## 2017-05-19 NOTE — Telephone Encounter (Signed)
Called pt, she states she got sick again because she is coughing up green mucus. She has taken nyquil for the symptoms with no relief. She denies fever and body aches but her throat is sore a little. She is using her nebulizer 4 times a day to loosen up her chest. She states her son went to the doctor with the same symptoms and they told him he had a URI and was treated with antibiotics. She wants something so it can prevent her from going into the hospital. CY please advise.  [facility] in [place]  Current Outpatient Medications on File Prior to Visit  Medication Sig Dispense Refill  . albuterol (PROVENTIL) (2.5 MG/3ML) 0.083% nebulizer solution USE 1 VIAL IN NEBULIZER EVERY 4 HOURS AS NEEDED 75 vial prn  . ALPRAZolam (XANAX) 0.5 MG tablet Take 1 tablet by mouth 2 (two) times daily as needed.     Marland Kitchen amoxicillin (AMOXIL) 500 MG tablet Take 1 tablet (500 mg total) by mouth 2 (two) times daily. 14 tablet 0  . doxycycline (VIBRA-TABS) 100 MG tablet Take two tablets the first day, then one tablet daily 8 tablet 0  . EPINEPHrine (EPI-PEN) 0.3 mg/0.3 mL DEVI Inject 0.3 mLs (0.3 mg total) into the muscle once. 1 Device prn  . ferrous sulfate 325 (65 FE) MG EC tablet Take 1 tablet by mouth 2 (two) times daily.    . fluticasone (FLONASE) 50 MCG/ACT nasal spray INSTILL 1 SPRAY INTO EACH NOSTRIL TWICE DAILY 16 g 2  . Fluticasone-Salmeterol (ADVAIR DISKUS) 500-50 MCG/DOSE AEPB INHALE 1 PUFF INTO THE LUNGS TWICE DAILY 60 each 3  . gabapentin (NEURONTIN) 300 MG capsule Take 1 capsule by mouth at bedtime.    . hydrochlorothiazide (HYDRODIURIL) 25 MG tablet Take 1 tablet (25 mg total) by mouth daily. 30 tablet 0  . ibuprofen (ADVIL,MOTRIN) 800 MG tablet Take 1 tablet (800 mg total) by mouth every 8 (eight) hours as needed. 30 tablet 0  . loratadine (CLARITIN) 10 MG tablet Take 1 tablet (10 mg total) by mouth daily. 30 tablet 11  . montelukast (SINGULAIR) 10 MG tablet TAKE 1 TABLET BY MOUTH AT BEDTIME.  30 tablet 12  . omeprazole (PRILOSEC) 20 MG capsule TAKE 1 CAPSULE ONCE A DAY. 90 capsule 0  . oxyCODONE (OXYCONTIN) 20 mg 12 hr tablet Take 10 mg by mouth.     . OXYGEN Place 2 L/min into the nose as directed. Oxygen via [place] @@ 2L/min on exertion with stationary concentrator and portable gas tank.    . TRAVATAN Z 0.004 % SOLN ophthalmic solution Place 1 drop into both eyes at bedtime.    Marland Kitchen umeclidinium bromide (INCRUSE ELLIPTA) 62.5 MCG/INH AEPB Inhale 1 puff into the lungs daily. 1 each 0  . varenicline (CHANTIX PAK) 0.5 MG X 11 & 1 MG X 42 tablet Take one 0.5 mg tablet by mouth once daily for 3 days, then increase to one 0.5 mg tablet twice daily for 4 days, then increase to one 1 mg tablet twice daily. 53 tablet 1  . VENTOLIN HFA 108 (90 Base) MCG/ACT inhaler INHALE 2 PUFFS INTO THE LUNGS EVERY 6 (SIX) HOURS AS NEEDED FOR WHEEZING OR SHORTNESS OF BREATH. 18 g 12   No current facility-administered medications on file prior to visit.    Allergies  Allergen Reactions  . Acetaminophen Nausea Only  . Asa [Aspirin]   . Hydrocodone-Acetaminophen     REACTION: itching  . Morphine And Related   . Sulfamethoxazole-Trimethoprim Rash  Instructions      Return in about 4 months (around 07/25/2017).  Order- CT chest no contrast      Dx lung nodules, tobacco user  Depo 80    Dx exacerbation COPD  Script for Chantix  Script for amoxacillin sent

## 2017-05-20 MED ORDER — VARENICLINE TARTRATE 1 MG PO TABS: 1.0000 mg | ORAL_TABLET | Freq: Two times a day (BID) | ORAL | 1 refills | Status: DC

## 2017-05-20 MED ORDER — DOXYCYCLINE HYCLATE 100 MG PO TABS: ORAL_TABLET | ORAL | 0 refills | Status: DC

## 2017-05-20 NOTE — Telephone Encounter (Signed)
Patient returning call, [phone].

## 2017-05-20 NOTE — Telephone Encounter (Signed)
Spoke with patient. She is aware of the doxycycline being called in for her. She was also requesting a refill on her Chantix. Advised patient I would take care of this as well. She verbalized understanding.   Nothing else needed at time of call.

## 2017-06-10 ENCOUNTER — Other Ambulatory Visit: Payer: Self-pay | Admitting: Internal Medicine

## 2017-06-12 ENCOUNTER — Telehealth: Payer: Self-pay | Admitting: Internal Medicine

## 2017-06-12 MED ORDER — AMOXICILLIN-POT CLAVULANATE 875-125 MG PO TABS: 1.0000 | ORAL_TABLET | Freq: Two times a day (BID) | ORAL | 0 refills | Status: DC

## 2017-06-12 NOTE — Telephone Encounter (Signed)
Offer augmentin 875 mg, # 14, 1 twice daily 

## 2017-06-12 NOTE — Telephone Encounter (Signed)
Spoke with pt. States that she is not feeling well. Reports cough, low grade fever and chest/sinus congestion. Cough is producing yellow/green mucus. Symptoms started 3-4 days ago. Has been taking breathing treatments and OTC cough medicine with minimal relief. She would to have something sent in.  CY - please advise. Thanks.  Allergies  Allergen Reactions  . Acetaminophen Nausea Only  . Asa [Aspirin]   . Hydrocodone-Acetaminophen     REACTION: itching  . Morphine And Related   . Sulfamethoxazole-Trimethoprim Rash   Current Outpatient Medications on File Prior to Visit  Medication Sig Dispense Refill  . albuterol (PROVENTIL) (2.5 MG/3ML) 0.083% nebulizer solution USE 1 VIAL IN NEBULIZER EVERY 4 HOURS AS NEEDED 75 vial prn  . ALPRAZolam (XANAX) 0.5 MG tablet Take 1 tablet by mouth 2 (two) times daily as needed.     Marland Kitchen. amoxicillin (AMOXIL) 500 MG tablet Take 1 tablet (500 mg total) by mouth 2 (two) times daily. 14 tablet 0  . doxycycline (VIBRA-TABS) 100 MG tablet Take two tablets the first day, then one tablet daily 8 tablet 0  . EPINEPHrine (EPI-PEN) 0.3 mg/0.3 mL DEVI Inject 0.3 mLs (0.3 mg total) into the muscle once. 1 Device prn  . ferrous sulfate 325 (65 FE) MG EC tablet Take 1 tablet by mouth 2 (two) times daily.    . fluticasone (FLONASE) 50 MCG/ACT nasal spray INSTILL 1 SPRAY INTO EACH NOSTRIL TWICE DAILY 16 g 2  . Fluticasone-Salmeterol (ADVAIR DISKUS) 500-50 MCG/DOSE AEPB INHALE 1 PUFF INTO THE LUNGS TWICE DAILY 60 each 3  . gabapentin (NEURONTIN) 300 MG capsule Take 1 capsule by mouth at bedtime.    . hydrochlorothiazide (HYDRODIURIL) 25 MG tablet Take 1 tablet (25 mg total) by mouth daily. 30 tablet 0  . ibuprofen (ADVIL,MOTRIN) 800 MG tablet Take 1 tablet (800 mg total) by mouth every 8 (eight) hours as needed. 30 tablet 0  . INCRUSE ELLIPTA 62.5 MCG/INH AEPB INHALE 1 PUFF INTO THE LUNGS DAILY. 30 each 1  . loratadine (CLARITIN) 10 MG tablet Take 1 tablet (10 mg total) by  mouth daily. 30 tablet 11  . montelukast (SINGULAIR) 10 MG tablet TAKE 1 TABLET BY MOUTH AT BEDTIME. 30 tablet 12  . omeprazole (PRILOSEC) 20 MG capsule TAKE 1 CAPSULE ONCE A DAY. 90 capsule 0  . oxyCODONE (OXYCONTIN) 20 mg 12 hr tablet Take 10 mg by mouth.     . OXYGEN Place 2 L/min into the nose as directed. Oxygen via [place] @@ 2L/min on exertion with stationary concentrator and portable gas tank.    . TRAVATAN Z 0.004 % SOLN ophthalmic solution Place 1 drop into both eyes at bedtime.    Marland Kitchen. umeclidinium bromide (INCRUSE ELLIPTA) 62.5 MCG/INH AEPB Inhale 1 puff into the lungs daily. 1 each 0  . varenicline (CHANTIX CONTINUING MONTH PAK) 1 MG tablet Take 1 tablet (1 mg total) by mouth 2 (two) times daily. 60 tablet 1  . varenicline (CHANTIX PAK) 0.5 MG X 11 & 1 MG X 42 tablet Take one 0.5 mg tablet by mouth once daily for 3 days, then increase to one 0.5 mg tablet twice daily for 4 days, then increase to one 1 mg tablet twice daily. 53 tablet 1  . VENTOLIN HFA 108 (90 Base) MCG/ACT inhaler INHALE 2 PUFFS INTO THE LUNGS EVERY 6 (SIX) HOURS AS NEEDED FOR WHEEZING OR SHORTNESS OF BREATH. 18 g 12   No current facility-administered medications on file prior to visit.

## 2017-06-12 NOTE — Telephone Encounter (Signed)
Spoke with pt. She is aware of CY's recommendations. Rx has been sent in. Nothing further was needed. 

## 2017-07-11 DIAGNOSIS — J9621 Acute and chronic respiratory failure with hypoxia: Secondary | ICD-10-CM

## 2017-07-11 DIAGNOSIS — J440 Chronic obstructive pulmonary disease with acute lower respiratory infection: Secondary | ICD-10-CM

## 2017-07-11 DIAGNOSIS — I5033 Acute on chronic diastolic (congestive) heart failure: Secondary | ICD-10-CM

## 2017-07-11 DIAGNOSIS — J189 Pneumonia, unspecified organism: Secondary | ICD-10-CM

## 2017-07-12 DIAGNOSIS — J189 Pneumonia, unspecified organism: Secondary | ICD-10-CM | POA: Diagnosis not present

## 2017-07-12 DIAGNOSIS — J9621 Acute and chronic respiratory failure with hypoxia: Secondary | ICD-10-CM | POA: Diagnosis not present

## 2017-07-12 DIAGNOSIS — I5033 Acute on chronic diastolic (congestive) heart failure: Secondary | ICD-10-CM | POA: Diagnosis not present

## 2017-07-12 DIAGNOSIS — R0602 Shortness of breath: Secondary | ICD-10-CM

## 2017-07-12 DIAGNOSIS — J440 Chronic obstructive pulmonary disease with acute lower respiratory infection: Secondary | ICD-10-CM | POA: Diagnosis not present

## 2017-07-27 ENCOUNTER — Ambulatory Visit: Payer: Medicare Other | Admitting: Internal Medicine

## 2017-08-06 ENCOUNTER — Telehealth: Payer: Self-pay | Admitting: Internal Medicine

## 2017-08-06 MED ORDER — DOXYCYCLINE HYCLATE 100 MG PO TABS: ORAL_TABLET | ORAL | 0 refills | Status: DC

## 2017-08-06 NOTE — Telephone Encounter (Signed)
Called and spoke with pt stating CY wanted her to do a doxy script for her to take 2 tabs today and then one tab daily until gone.  Pt expressed understanding. Verified pt's pharmacy and sent script to pt's pharmacy for her.  Nothing further needed at this time.

## 2017-08-06 NOTE — Telephone Encounter (Signed)
Offer doxycycline 100 mg, # 8, 2 today, then one daily 

## 2017-08-06 NOTE — Telephone Encounter (Signed)
Spoke with patient. She stated that she was in the hospital 3 weeks ago with pneumonia and was discharged with levofloxacin and a prednisone taper. She has completed both of these but still feels bad. She has a productive cough with white, yellowish phlgem. She feels fatigued all of the time. She is also wheezing more than usual. Denies any body aches or fever.   She wishes to use [facility] in [place].   CY, please advise. Thanks!

## 2017-08-10 ENCOUNTER — Other Ambulatory Visit: Payer: Self-pay

## 2017-08-10 ENCOUNTER — Other Ambulatory Visit: Payer: Self-pay | Admitting: Internal Medicine

## 2017-08-11 MED ORDER — OMEPRAZOLE 20 MG PO CPDR: DELAYED_RELEASE_CAPSULE | ORAL | 0 refills | Status: DC

## 2017-08-11 MED ORDER — FLUTICASONE-SALMETEROL 500-50 MCG/DOSE IN AEPB: INHALATION_SPRAY | RESPIRATORY_TRACT | 3 refills | Status: DC

## 2017-09-07 ENCOUNTER — Encounter: Payer: Self-pay | Admitting: Family Medicine

## 2017-09-07 ENCOUNTER — Ambulatory Visit (INDEPENDENT_AMBULATORY_CARE_PROVIDER_SITE_OTHER): Payer: Medicare Other | Admitting: Family Medicine

## 2017-09-07 ENCOUNTER — Other Ambulatory Visit: Payer: Self-pay

## 2017-09-07 ENCOUNTER — Telehealth: Payer: Self-pay | Admitting: *Deleted

## 2017-09-07 VITALS — BP 118/84 | HR 96 | Temp 98.7°F | Wt 249.0 lb

## 2017-09-07 DIAGNOSIS — Z23 Encounter for immunization: Secondary | ICD-10-CM

## 2017-09-07 DIAGNOSIS — K219 Gastro-esophageal reflux disease without esophagitis: Secondary | ICD-10-CM

## 2017-09-07 DIAGNOSIS — I10 Essential (primary) hypertension: Secondary | ICD-10-CM

## 2017-09-07 DIAGNOSIS — J449 Chronic obstructive pulmonary disease, unspecified: Secondary | ICD-10-CM | POA: Diagnosis not present

## 2017-09-07 DIAGNOSIS — E785 Hyperlipidemia, unspecified: Secondary | ICD-10-CM

## 2017-09-07 DIAGNOSIS — Z1231 Encounter for screening mammogram for malignant neoplasm of breast: Secondary | ICD-10-CM | POA: Diagnosis not present

## 2017-09-07 DIAGNOSIS — M25551 Pain in right hip: Secondary | ICD-10-CM

## 2017-09-07 DIAGNOSIS — Z114 Encounter for screening for human immunodeficiency virus [HIV]: Secondary | ICD-10-CM

## 2017-09-07 DIAGNOSIS — F17200 Nicotine dependence, unspecified, uncomplicated: Secondary | ICD-10-CM | POA: Diagnosis not present

## 2017-09-07 MED ORDER — MUPIROCIN 2 % EX OINT: 1.0000 "application " | TOPICAL_OINTMENT | Freq: Two times a day (BID) | CUTANEOUS | 3 refills | Status: DC

## 2017-09-07 MED ORDER — DOXYCYCLINE HYCLATE 100 MG PO TABS: ORAL_TABLET | ORAL | 0 refills | Status: DC

## 2017-09-07 MED ORDER — TETANUS-DIPHTH-ACELL PERTUSSIS 5-2-15.5 LF-MCG/0.5 IM SUSP: 0.5000 mL | Freq: Once | INTRAMUSCULAR | 0 refills | Status: AC

## 2017-09-07 MED ORDER — FUROSEMIDE 40 MG PO TABS: 40.0000 mg | ORAL_TABLET | Freq: Every day | ORAL | 3 refills | Status: DC | PRN

## 2017-09-07 MED ORDER — VARENICLINE TARTRATE 0.5 MG PO TABS: 0.5000 mg | ORAL_TABLET | Freq: Two times a day (BID) | ORAL | 1 refills | Status: DC

## 2017-09-07 MED ORDER — OMEPRAZOLE 20 MG PO CPDR: DELAYED_RELEASE_CAPSULE | ORAL | 3 refills | Status: DC

## 2017-09-07 MED ORDER — AMLODIPINE BESYLATE 10 MG PO TABS: 10.0000 mg | ORAL_TABLET | Freq: Every day | ORAL | 3 refills | Status: DC

## 2017-09-07 NOTE — Progress Notes (Signed)
Subjective:  [name] is a 52 y.o. female who [facility] for follow-up of multiple complaints.  HPI:  Cough Patient has a chronic cough that is multifactorial from COPD and tobacco use.  However she was recently [facility] May for pneumonia.  She had been put on levofloxacin and a prednisone taper at that time.  She had left as she felt like she was getting poor care during her hospital stay.  She had called her pulmonologist who had given her doxycycline at the end of May after her hospitalization.  She felt that this antibiotic was very helpful at the time.  She had been doing well in terms of her breathing.  However recently she has started to notice  the beginnings of some wheezing.  She also notes that her sputum is now starting to be yellow-tinged although there is no frank purulence.  No fevers or chills.  She plans to see her pulmonologist in this coming week.  HTN She states that she has only been on Coreg 3.125 mg twice daily and Lasix 40 mg daily for the last month.  She is supposed to take her Lasix daily as needed however she chronically has leg swelling she thinks ever since she started having leg problems with her multiple right hip surgeries. During her last hospitalization with pneumonia in May she was started on amlodipine 10 mg daily and hydralazine 50 mg twice daily in addition to her carvedilol and Lasix.  She has been out of her hydralazine and amlodipine for the last 1 month.  She has not had any lightheadedness, dizziness, chest pain, shortness of breath, worsening of her chronic lower extremity edema.  Tobacco use She is a current smoker.  She would like to quit.  She recalls being on Chantix in the past which allowed her to get down to 3 cigarettes a day.  She felt like it did not impact her nerves in a negative way but rather helped her feel better overall.   ROS: Per HPI  PMH: She reports that she has been smoking  cigarettes.  She has a 35.00 pack-year smoking history. She has never used smokeless tobacco. She reports that she does not drink alcohol or use drugs.  Objective:  Physical Exam: BP 118/84   Pulse 96   Temp 98.7 F (37.1 C) (Oral)   Wt 249 lb (112.9 kg)   LMP 04/08/2014   SpO2 93% Comment: 2L of O2  BMI 47.05 kg/m   Gen: Sitting in wheelchair, NAD, resting comfortably CV: RRR with no murmurs appreciated Pulm: NWOB on 2L via nasal cannula, slight expiratory wheezes, no rales or rhonchi, good air movement bilaterally  GI: Normal bowel sounds present. Soft, Nontender, Nondistended. MSK: Bilateral lower extremities with 2+ edema and chronic skin changes with flaking and slight erythema Psych: Tearful   Assessment/Plan:  1. COPD mixed type [facility](HCC) Patient is reporting some increased shortness of breath and questionable increased purulence with yellow-tinged sputum.  Her lung exam is significant for some slight wheezing but she has normal work of breathing on her chronic 2 L of oxygen via nasal cannula and she is moving good air bilaterally.  Her lung exam is improved  in comparison with prior office visits.  She is also afebrile and relatively well-appearing.  Given her chronic lung disease, which is proved to be quite tenuous, will treat with doxycycline for 5 days.  Given her reassuring lung exam, will hold  off prednisone but discussed with patient that she should call back if she does not improve over the next 1 to 2 days.  Patient voiced good understanding.   2. HYPERTENSION, BENIGN ESSENTIAL Stable and at goal.  Continue Coreg 3.125 mg twice daily Lasix 40 mg daily.  As patient has been off of amlodipine and hydralazine for the last 1 month, will not restart today as her BP is well controlled - CBC - Basic metabolic panel  3. Tobacco use disorder Patient was counseled on smoking cessation.  She has been successful on Chantix in the past.  Refill provided today  4. Screening for HIV  without presence of risk factors Patient agreeable to low risk one-time routine screening.  She has no risk factors.  We will draw with other blood work today - HIV antibody  5. Visit for screening mammogram Patient agreeable to routine screening mammogram, ordered today. - MM Digital Screening; Future  6. Need for Tdap vaccination Patient agreeable to receiving at pharmacy, Rx for tetanus booster sent in today - Tdap (ADACEL) 07-09-13.5 LF-MCG/0.5 injection; Inject 0.5 mLs into the muscle once for 1 dose.  Dispense: 0.5 mL; Refill: 0   Leland Her, DO PGY-3, [facility] 09/07/2017 2:38 PM

## 2017-09-07 NOTE — Telephone Encounter (Signed)
Pharmacy called and needed to verify instruction on doxy.  Sig states to take 2 today and one until gone with #10 pills.  This equals more than the 5 day course.   Spoke with Dr. Artist PaisYoo.  Script is supposed to be BID for 5 days.  Pharmacy informed and changed in chart. Alfreddie Consalvo, Maryjo RochesterJessica Dawn, CMA

## 2017-09-07 NOTE — Patient Instructions (Addendum)
It was good to see you today!  Reminders - get tetanus at pharmacy - get mammogram  chantix days 1-3 take once a day then go to twice a day  Please check-out at the front desk before leaving the clinic. Make an appointment in  3-6 months as you are able  We are checking some labs today. If results require attention, either myself or my nurse will get in touch with you. If everything is normal, you will get a letter in the mail or a message in My Chart. Please give us a call if you do not hear from us after 2 weeks.   Please bring all of your medications with you to each visit.   Sign up for My Chart to have easy access to your labs results, and communication with your primary care physician.  Feel free to call with any questions or concerns at any time, at [phone].   Take care,  Dr. Leland HerElsia J Alexi Dorminey, DO [facility]

## 2017-09-08 ENCOUNTER — Encounter: Payer: Self-pay | Admitting: Family Medicine

## 2017-09-08 LAB — BASIC METABOLIC PANEL
BUN / CREAT RATIO: 13 (ref 9–23)
BUN: 7 mg/dL (ref 6–24)
CO2: 31 mmol/L — ABNORMAL HIGH (ref 20–29)
CREATININE: 0.55 mg/dL — AB (ref 0.57–1.00)
Calcium: 9.4 mg/dL (ref 8.7–10.2)
Chloride: 96 mmol/L (ref 96–106)
GFR, EST AFRICAN AMERICAN: 126 mL/min/{1.73_m2} (ref >59)
GFR, EST NON AFRICAN AMERICAN: 110 mL/min/{1.73_m2} (ref >59)
Glucose: 108 mg/dL — ABNORMAL HIGH (ref 65–99)
POTASSIUM: 3.7 mmol/L (ref 3.5–5.2)
SODIUM: 140 mmol/L (ref 134–144)

## 2017-09-08 LAB — CBC
HEMATOCRIT: 37 % (ref 34.0–46.6)
HEMOGLOBIN: 11.7 g/dL (ref 11.1–15.9)
MCH: 27.5 pg (ref 26.6–33.0)
MCHC: 31.6 g/dL (ref 31.5–35.7)
MCV: 87 fL (ref 79–97)
Platelets: 440 10*3/uL (ref 150–450)
RBC: 4.25 x10E6/uL (ref 3.77–5.28)
RDW: 13.7 % (ref 12.3–15.4)
WBC: 10.7 10*3/uL (ref 3.4–10.8)

## 2017-09-08 LAB — HIV ANTIBODY (ROUTINE TESTING W REFLEX): HIV Screen 4th Generation wRfx: NONREACTIVE

## 2017-09-08 LAB — LDL CHOLESTEROL, DIRECT: LDL Direct: 96 mg/dL (ref 0–99)

## 2017-09-11 ENCOUNTER — Telehealth: Payer: Self-pay | Admitting: *Deleted

## 2017-09-11 MED ORDER — PREDNISONE 10 MG PO TABS: ORAL_TABLET | ORAL | 0 refills | Status: DC

## 2017-09-11 MED ORDER — CIPROFLOXACIN HCL 500 MG PO TABS: 500.0000 mg | ORAL_TABLET | Freq: Two times a day (BID) | ORAL | 0 refills | Status: DC

## 2017-09-11 NOTE — Telephone Encounter (Signed)
Pt calls nurse line.  She wants to let Dr. Artist Paisyoo that she is on her last 2 pills of abx and is not feeling better.  She is requesting a refill of abx and a script for prednisone.  Will forward to MD. Fleeger, Maryjo RochesterJessica Dawn, CMA

## 2017-09-11 NOTE — Telephone Encounter (Signed)
Rx for cipro and prednisone taper sent to patient pharmacy to cover for COPD exacerbation.

## 2017-09-28 ENCOUNTER — Ambulatory Visit: Payer: Medicare Other | Admitting: Internal Medicine

## 2017-10-10 ENCOUNTER — Other Ambulatory Visit: Payer: Self-pay | Admitting: Internal Medicine

## 2017-11-10 ENCOUNTER — Other Ambulatory Visit: Payer: Self-pay

## 2017-11-10 MED ORDER — VARENICLINE TARTRATE 0.5 MG PO TABS: 0.5000 mg | ORAL_TABLET | Freq: Two times a day (BID) | ORAL | 1 refills | Status: DC

## 2017-11-28 ENCOUNTER — Emergency Department (HOSPITAL_COMMUNITY): Payer: Medicare Other

## 2017-11-28 ENCOUNTER — Encounter (HOSPITAL_COMMUNITY): Payer: Self-pay | Admitting: Emergency Medicine

## 2017-11-28 ENCOUNTER — Emergency Department (HOSPITAL_COMMUNITY)
Admission: EM | Admit: 2017-11-28 | Discharge: 2017-11-28 | Disposition: A | Discharge status: Home / Self Care | Payer: Medicare Other | Attending: Emergency Medicine | Admitting: Emergency Medicine

## 2017-11-28 ENCOUNTER — Other Ambulatory Visit: Payer: Self-pay

## 2017-11-28 DIAGNOSIS — I10 Essential (primary) hypertension: Secondary | ICD-10-CM | POA: Insufficient documentation

## 2017-11-28 DIAGNOSIS — M25551 Pain in right hip: Secondary | ICD-10-CM

## 2017-11-28 DIAGNOSIS — F17210 Nicotine dependence, cigarettes, uncomplicated: Secondary | ICD-10-CM | POA: Diagnosis not present

## 2017-11-28 DIAGNOSIS — J449 Chronic obstructive pulmonary disease, unspecified: Secondary | ICD-10-CM | POA: Diagnosis not present

## 2017-11-28 DIAGNOSIS — Z79899 Other long term (current) drug therapy: Secondary | ICD-10-CM | POA: Diagnosis not present

## 2017-11-28 DIAGNOSIS — T148XXA Other injury of unspecified body region, initial encounter: Secondary | ICD-10-CM

## 2017-11-28 DIAGNOSIS — R062 Wheezing: Secondary | ICD-10-CM | POA: Diagnosis not present

## 2017-11-28 DIAGNOSIS — T8149XA Infection following a procedure, other surgical site, initial encounter: Secondary | ICD-10-CM | POA: Insufficient documentation

## 2017-11-28 DIAGNOSIS — L089 Local infection of the skin and subcutaneous tissue, unspecified: Secondary | ICD-10-CM

## 2017-11-28 DIAGNOSIS — Y792 Prosthetic and other implants, materials and accessory orthopedic devices associated with adverse incidents: Secondary | ICD-10-CM | POA: Insufficient documentation

## 2017-11-28 LAB — BASIC METABOLIC PANEL
ANION GAP: 12 (ref 5–15)
BUN: 14 mg/dL (ref 6–20)
CALCIUM: 9.2 mg/dL (ref 8.9–10.3)
CO2: 25 mmol/L (ref 22–32)
CREATININE: 0.67 mg/dL (ref 0.44–1.00)
Chloride: 98 mmol/L (ref 98–111)
GLUCOSE: 147 mg/dL — AB (ref 70–99)
Potassium: 3.5 mmol/L (ref 3.5–5.1)
Sodium: 135 mmol/L (ref 135–145)

## 2017-11-28 LAB — CBC WITH DIFFERENTIAL/PLATELET
ABS IMMATURE GRANULOCYTES: 0 10*3/uL (ref 0.0–0.1)
BASOS PCT: 1 %
Basophils Absolute: 0.2 10*3/uL — ABNORMAL HIGH (ref 0.0–0.1)
Eosinophils Absolute: 0.5 10*3/uL (ref 0.0–0.7)
Eosinophils Relative: 4 %
HCT: 40.7 % (ref 36.0–46.0)
Hemoglobin: 13 g/dL (ref 12.0–15.0)
IMMATURE GRANULOCYTES: 0 %
Lymphocytes Relative: 22 %
Lymphs Abs: 2.5 10*3/uL (ref 0.7–4.0)
MCH: 28 pg (ref 26.0–34.0)
MCHC: 31.9 g/dL (ref 30.0–36.0)
MCV: 87.5 fL (ref 78.0–100.0)
MONOS PCT: 7 %
Monocytes Absolute: 0.8 10*3/uL (ref 0.1–1.0)
NEUTROS ABS: 7.2 10*3/uL (ref 1.7–7.7)
NEUTROS PCT: 66 %
PLATELETS: 464 10*3/uL — AB (ref 150–400)
RBC: 4.65 MIL/uL (ref 3.87–5.11)
RDW: 12.9 % (ref 11.5–15.5)
WBC: 11.2 10*3/uL — ABNORMAL HIGH (ref 4.0–10.5)

## 2017-11-28 LAB — SEDIMENTATION RATE: Sed Rate: 38 mm/hr — ABNORMAL HIGH (ref 0–22)

## 2017-11-28 LAB — C-REACTIVE PROTEIN: CRP: 7.1 mg/dL — ABNORMAL HIGH (ref <1.0)

## 2017-11-28 MED ORDER — IPRATROPIUM-ALBUTEROL 0.5-2.5 (3) MG/3ML IN SOLN
3.0000 mL | Freq: Once | RESPIRATORY_TRACT | Status: AC
  Administered 2017-11-28: 3 mL via RESPIRATORY_TRACT
  Filled 2017-11-28: qty 3

## 2017-11-28 MED ORDER — OXYCODONE HCL 5 MG PO TABS
20.0000 mg | ORAL_TABLET | Freq: Once | ORAL | Status: AC
  Administered 2017-11-28: 20 mg via ORAL
  Filled 2017-11-28: qty 4

## 2017-11-28 MED ORDER — CEPHALEXIN 250 MG PO CAPS
500.0000 mg | ORAL_CAPSULE | Freq: Once | ORAL | Status: AC
  Administered 2017-11-28: 500 mg via ORAL
  Filled 2017-11-28: qty 2

## 2017-11-28 MED ORDER — CEPHALEXIN 500 MG PO CAPS: 500.0000 mg | ORAL_CAPSULE | Freq: Four times a day (QID) | ORAL | 0 refills | Status: DC

## 2017-11-28 NOTE — ED Notes (Signed)
Patient verbalizes understanding of discharge instructions. Opportunity for questioning and answers were provided. Armband removed by staff, pt discharged from ED.  

## 2017-11-28 NOTE — ED Triage Notes (Signed)
Pt. Stated,Ive had 6 hip surgeries and 3 hip replacements, My last one was March 2018. Last sat night I started having bleeding and oozing all around the incision site last sat.  I was not able to get to hospital until yesterday and unable to wait at [facility] yesterday.

## 2017-11-28 NOTE — Discharge Instructions (Addendum)
You were evaluated in the emergency department for increased right hip pain and drainage from the surgical incision.  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  We are prescribing you some oral antibiotics to take and he will need to follow-up with Dr. Dolores LoryHallows in his office.

## 2017-11-28 NOTE — ED Notes (Signed)
Patient transported to X-ray 

## 2017-11-28 NOTE — ED Provider Notes (Signed)
[facility] EMERGENCY DEPARTMENT Provider Note   CSN: [id number] Arrival date & time: 11/28/17  1048     History   Chief Complaint Chief Complaint  Patient presents with  . Hip Pain  . Wound Infection    HPI [name] is a 52 y.o. female.  She has had multiple hip surgeries due to infected hardware on the right.  Last surgery was and March 2018 by Dr. Dolores Lory from [place].  She has bad chronic pain in that hip since then and is wheelchair-bound for the last year or so.  She is had concerns that the hip was infected and her doctor was going to set her up with a bone scan but about a week ago it began draining some clear yellow fluid from the surgical incision.  She did not try to reach her doctor but went to [facility] emergency department yesterday but the wait was too long so she is here today for evaluation.  She said she has had fevers up to 102 oral.  The pain is constant and severe despite 15 mg of oxycodone code own every 4 hours.  She has baseline shortness of breath COPD and continues to smoke.  No chest pain no abdominal pain vomiting or diarrhea.  The history is provided by the patient.  Hip Pain  This is a chronic problem. Episode onset: 1 year. The problem occurs constantly. The problem has been gradually worsening. Pertinent negatives include no chest pain, no abdominal pain, no headaches and no shortness of breath. The symptoms are aggravated by bending and twisting. Nothing relieves the symptoms. She has tried rest for the symptoms. The treatment provided no relief.    Past Medical History:  Diagnosis Date  . Anxiety   . COPD (chronic obstructive pulmonary disease) (HCC)   . Depression   . GERD (gastroesophageal reflux disease)   . History of alcoholism (HCC)   . Hypertension     Patient Active Problem List   Diagnosis Date Noted  . Chronic respiratory failure (HCC) 09/24/2015  . Periprosthetic hip fracture 09/24/2015  . Acquired absence of right hip  joint following removal of joint prosthesis with presence of antibiotic-impregnated cement spacer 09/14/2015  . Status post hip surgery 08/28/2015  . Prediabetes 08/28/2015  . Status post revision of total hip 05/15/2015  . 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 Aftercare following right hip joint replacement surgery 11/17/2014  . Presence of right artificial hip joint 11/17/2014  . Status post total replacement of right hip 11/01/2014  . Anxiety and depression 10/02/2014  . Chronic pain syndrome 10/02/2014  . COPD mixed type (HCC) 10/02/2014  . Glaucoma 10/02/2014  . COPD exacerbation (HCC) 04/14/2014  . Vulvar lesion 09/27/2013  . Right hip pain 09/27/2013  . Lung nodules 08/20/2013  . Right hand pain 05/17/2013  . Abdominal pain, chronic, epigastric 04/07/2013  . Seasonal and perennial allergic rhinitis 06/09/2012  . INSOMNIA 02/22/2010  . BACK PAIN 12/06/2009  . KNEE PAIN, RIGHT, CHRONIC 02/15/2008  . ESOPHAGEAL REFLUX 08/23/2007  . Morbid obesity due to excess calories (HCC) 07/01/2006  . DEPRESSIVE DISORDER, MAJOR RCR, UNSPECIFIED 07/01/2006  . ANXIETY DISORDER, GENERALIZED 07/01/2006  . Tobacco use 07/01/2006  . HYPERTENSION, BENIGN ESSENTIAL 07/01/2006    Past Surgical History:  Procedure Laterality Date  . right hip surgery       OB History   None      Home Medications    Prior to Admission medications  Medication Sig Start Date End Date Taking? Authorizing Provider  albuterol (PROVENTIL) (2.5 MG/3ML) 0.083% nebulizer solution USE 1 VIAL IN NEBULIZER EVERY 4 HOURS AS NEEDED 02/14/13   Waymon BudgeYoung, Clinton D, MD  ALPRAZolam Prudy Feeler(XANAX) 0.5 MG tablet Take 1 tablet by mouth 2 (two) times daily as needed.  02/09/13   [provider]  carvedilol (COREG) 3.125 MG tablet Take 3.125 mg by mouth 2 (two) times daily with a meal.    [provider]  ciprofloxacin (CIPRO) 500 MG tablet Take 1 tablet (500 mg total) by mouth 2 (two)  times daily. 09/11/17   Leland HerYoo, Elsia J, DO  EPINEPHrine (EPI-PEN) 0.3 mg/0.3 mL DEVI Inject 0.3 mLs (0.3 mg total) into the muscle once. 08/19/12   Jetty DuhamelYoung, Clinton D, MD  ferrous sulfate 325 (65 FE) MG EC tablet Take 1 tablet by mouth 2 (two) times daily. 11/02/14 05/12/16  [provider]  fluticasone (FLONASE) 50 MCG/ACT nasal spray INSTILL 1 SPRAY INTO EACH NOSTRIL TWICE DAILY 09/08/16   Leland HerYoo, Elsia J, DO  Fluticasone-Salmeterol (ADVAIR DISKUS) 500-50 MCG/DOSE AEPB INHALE 1 PUFF INTO THE LUNGS TWICE DAILY 08/11/17   Leland HerYoo, Elsia J, DO  furosemide (LASIX) 40 MG tablet Take 1 tablet (40 mg total) by mouth daily as needed. 09/07/17   Leland HerYoo, Elsia J, DO  gabapentin (NEURONTIN) 300 MG capsule Take 1 capsule by mouth at bedtime. 11/02/14 05/12/16  [provider]  ibuprofen (ADVIL,MOTRIN) 800 MG tablet Take 1 tablet (800 mg total) by mouth every 8 (eight) hours as needed. 11/11/16   Jeneen RinksYoo, Elsia J, DO  INCRUSE ELLIPTA 62.5 MCG/INH AEPB INHALE 1 PUFF INTO THE LUNGS DAILY. 10/12/17   Waymon BudgeYoung, Clinton D, MD  loratadine (CLARITIN) 10 MG tablet Take 1 tablet (10 mg total) by mouth daily. 02/02/12   Carney Livinghambliss, Marshall L, MD  montelukast (SINGULAIR) 10 MG tablet TAKE 1 TABLET BY MOUTH AT BEDTIME. 01/12/17   Young, Joni Fearslinton D, MD  mupirocin ointment (BACTROBAN) 2 % Place 1 application into the nose 2 (two) times daily. 09/07/17   Leland HerYoo, Elsia J, DO  omeprazole (PRILOSEC) 20 MG capsule TAKE 1 CAPSULE ONCE A DAY. 09/07/17   Leland HerYoo, Elsia J, DO  oxyCODONE (OXYCONTIN) 20 mg 12 hr tablet Take 10 mg by mouth.     [provider]  OXYGEN Place 2 L/min into the nose as directed. Oxygen via [place] @@ 2L/min on exertion with stationary concentrator and portable gas tank. 09/27/15   [provider]  predniSONE (DELTASONE) 10 MG tablet TAKE 4 TABLETS FOR 2 DAYS, 3 TABLETS FOR 2 DAYS, 2 TABLETS FOR 2 DAYS, 1 TABLET FOR 2 DAYS, THEN STOP. 09/11/17   Jeneen RinksYoo, Elsia J, DO  TRAVATAN Z 0.004 % SOLN ophthalmic solution Place 1 drop into both  eyes at bedtime. 05/12/12   [provider]  umeclidinium bromide (INCRUSE ELLIPTA) 62.5 MCG/INH AEPB Inhale 1 puff into the lungs daily. 05/12/16   Jetty DuhamelYoung, Clinton D, MD  varenicline (CHANTIX) 0.5 MG tablet Take 1 tablet (0.5 mg total) by mouth 2 (two) times daily. 11/10/17   Leland HerYoo, Elsia J, DO  VENTOLIN HFA 108 (90 Base) MCG/ACT inhaler INHALE 2 PUFFS INTO THE LUNGS EVERY 6 (SIX) HOURS AS NEEDED FOR WHEEZING OR SHORTNESS OF BREATH. 01/12/17   Waymon BudgeYoung, Clinton D, MD    Family History Family History  Problem Relation Age of Onset  . Hypertension Mother   . Hypertension Father     Social History Social History   Tobacco Use  . Smoking  status: Current Every Day Smoker    Packs/day: 1.00    Years: 35.00    Pack years: 35.00    Types: Cigarettes  . Smokeless tobacco: Never Used  Substance Use Topics  . Alcohol use: No    Alcohol/week: 0.0 standard drinks    Comment: History of Alcoholism  . Drug use: No     Allergies   Acetaminophen; Asa [aspirin]; Hydrocodone-acetaminophen; Morphine and related; and Sulfamethoxazole-trimethoprim   Review of Systems Review of Systems  Constitutional: Negative for fever.  HENT: Negative for sore throat.   Eyes: Negative for visual disturbance.  Respiratory: Positive for cough and wheezing. Negative for shortness of breath.   Cardiovascular: Positive for leg swelling. Negative for chest pain.  Gastrointestinal: Negative for abdominal pain.  Genitourinary: Negative for dysuria.  Musculoskeletal: Positive for gait problem.  Skin: Positive for wound. Negative for rash.  Neurological: Negative for headaches.     Physical Exam Updated Vital Signs BP (!) 145/86 (BP Location: Right Arm)   Pulse 80   Temp 99 F (37.2 C) (Oral)   Resp 18   Ht 5\' 1"  (1.549 m)   Wt 127 kg   LMP 04/08/2014   SpO2 96%   BMI 52.91 kg/m   Physical Exam  Constitutional: She appears well-developed and well-nourished. No distress.  HENT:  Head: Normocephalic  and atraumatic.  Eyes: Conjunctivae are normal.  Neck: Neck supple.  Cardiovascular: Normal rate and regular rhythm.  No murmur heard. Pulmonary/Chest: Effort normal. No respiratory distress. She has wheezes (scattered).  Abdominal: Soft. There is no tenderness.  Musculoskeletal: She exhibits edema (symmetric bilat edema LE) and tenderness. She exhibits no deformity.  She is a well-healed surgical scar over her right hip.  There is a few pinhole areas where some clearish yellow fluid is draining from.  The upper part of the incision is diffusely tender but no surrounding erythema.  She has limited range of motion at that joint which causes pain.  Neurological: She is alert.  Skin: Skin is warm and dry.  Psychiatric: She has a normal mood and affect.  Nursing note and vitals reviewed.      ED Treatments / Results  Labs (all labs ordered are listed, but only abnormal results are displayed) Labs Reviewed  CBC WITH DIFFERENTIAL/PLATELET - Abnormal; Notable for the following components:      Result Value   WBC 11.2 (*)    Platelets 464 (*)    Basophils Absolute 0.2 (*)    All other components within normal limits  BASIC METABOLIC PANEL - Abnormal; Notable for the following components:   Glucose, Bld 147 (*)    All other components within normal limits  SEDIMENTATION RATE - Abnormal; Notable for the following components:   Sed Rate 38 (*)    All other components within normal limits  C-REACTIVE PROTEIN - Abnormal; Notable for the following components:   CRP 7.1 (*)    All other components within normal limits  AEROBIC CULTURE (SUPERFICIAL SPECIMEN)  CULTURE, BLOOD (ROUTINE X 2)  CULTURE, BLOOD (ROUTINE X 2)    EKG None  Radiology Dg Hip Unilat With Pelvis 2-3 Views Right  Result Date: 11/28/2017 CLINICAL DATA:  Hip pain EXAM: DG HIP (WITH OR WITHOUT PELVIS) 2-3V RIGHT COMPARISON:  09/22/2015 FINDINGS: Status post right total hip replacement. No evidence of fracture. SI  joints and symphysis pubis unremarkable. IMPRESSION: Negative. Electronically Signed   By: Kennith Center M.D.   On: 11/28/2017 15:05    Procedures  Procedures (including critical care time)  Medications Ordered in ED Medications  oxyCODONE (Oxy IR/ROXICODONE) immediate release tablet 20 mg (20 mg Oral Given 11/28/17 1516)  ipratropium-albuterol (DUONEB) 0.5-2.5 (3) MG/3ML nebulizer solution 3 mL (3 mLs Nebulization Given 11/28/17 1657)  cephALEXin (KEFLEX) capsule 500 mg (500 mg Oral Given 11/28/17 1657)     Initial Impression / Assessment and Plan / ED Course  I have reviewed the triage vital signs and the nursing notes.  Pertinent labs & imaging results that were available during my care of the patient were reviewed by me and considered in my medical decision making (see chart for details).  Clinical Course as of Nov 29 25  Sat Nov 28, 2017  1612 Put a call into [facility] to see if he could review this with your orthopedist or other coverage regarding plan.  She sees a doctor Hallows.    [MB]  1643 Discussed with Dr. Dolores Lory from [facility] who says the hip is probably been infected for months and he is talked to her before about doing a hip surgery but she always has another reason why she can get it done now.  He does not feel strongly that she needs to be on antibiotics and he will have the office contact her regarding outpatient follow-up.  No other emergent recommendations.   [MB]  [id number] Updated patient with results.  She was quite upset about finding out this and is asking for breathing treatment.  She also would like to be put on some antibiotics just in case it might help slow down any infection that might be going.   [MB]    Clinical Course User Index [MB] Terrilee Files, MD     Final Clinical Impressions(s) / ED Diagnoses   Final diagnoses:  Right hip pain  Wound infection    ED Discharge Orders         Ordered    cephALEXin (KEFLEX) 500 MG capsule  4 times daily      11/28/17 1725           Terrilee Files, MD 11/29/17 [phone]

## 2017-11-30 LAB — AEROBIC CULTURE W GRAM STAIN (SUPERFICIAL SPECIMEN): Special Requests: NORMAL

## 2017-11-30 LAB — AEROBIC CULTURE  (SUPERFICIAL SPECIMEN)

## 2017-12-01 ENCOUNTER — Telehealth: Payer: Self-pay | Admitting: Emergency Medicine

## 2017-12-01 ENCOUNTER — Telehealth (HOSPITAL_COMMUNITY): Payer: Self-pay | Admitting: Pharmacist

## 2017-12-01 NOTE — Telephone Encounter (Signed)
Post ED Visit - Positive Culture Follow-up: Successful Patient Follow-Up  Culture assessed and recommendations reviewed by:  []  Enzo Bi, Pharm.D. []  Celedonio Miyamoto, Pharm.D., BCPS AQ-ID []  Garvin Fila, Pharm.D., BCPS []  Georgina Pillion, [address].D., BCPS []  [place], [address].D., BCPS, AAHIVP []  Estella Husk, Pharm.D., BCPS, AAHIVP []  Lysle Pearl, PharmD, BCPS []  Phillips Climes, PharmD, BCPS [x]  Agapito Games, PharmD, BCPS []  Verlan Friends, PharmD  Positive urine culture  []  Patient discharged without antimicrobial prescription and treatment is now indicated [x]  Organism is resistant to prescribed ED discharge antimicrobial []  Patient with positive blood cultures  Changes discussed with ED provider: Harlene Salts PA New antibiotic prescription stop cephalexin, start Amoxicillin 500mg  po bid x 7 days Called to [facility] @ [phone]  Contacted patient, 12/01/17 1114   Berle Mull 12/01/2017, 11:12 AM

## 2017-12-01 NOTE — Progress Notes (Signed)
ED Antimicrobial Stewardship Positive Culture Follow Up   [name] is an 52 y.o. female who presented to [facility] on (Not on file) with a chief complaint of No chief complaint on file.   Recent Results (from the past 720 hour(s))  Wound or Superficial Culture     Status: None   Collection Time: 11/28/17  2:26 PM  Result Value Ref Range Status   Specimen Description WOUND THIGH  Final   Special Requests Normal  Final   Gram Stain   Final    RARE WBC PRESENT, PREDOMINANTLY PMN RARE GRAM POSITIVE COCCI IN CLUSTERS RARE GRAM POSITIVE RODS    Culture   Final    FEW ENTEROCOCCUS FAECALIS FEW NORMAL SKIN FLORA Performed at [facility], [address] [place], [place] [address]    Report Status 11/30/2017 FINAL  Final   Organism ID, Bacteria ENTEROCOCCUS FAECALIS  Final      Susceptibility   Enterococcus faecalis - MIC*    AMPICILLIN <=2 SENSITIVE Sensitive     VANCOMYCIN 1 SENSITIVE Sensitive     GENTAMICIN SYNERGY RESISTANT Resistant     * FEW ENTEROCOCCUS FAECALIS  Culture, blood (routine x 2)     Status: None (Preliminary result)   Collection Time: 11/28/17  2:29 PM  Result Value Ref Range Status   Specimen Description BLOOD LEFT ANTECUBITAL  Final   Special Requests   Final    BOTTLES DRAWN AEROBIC AND ANAEROBIC Blood Culture adequate volume   Culture   Final    NO GROWTH 3 DAYS Performed at [facility], [address] [place], [place] [address]    Report Status PENDING  Incomplete  Culture, blood (routine x 2)     Status: None (Preliminary result)   Collection Time: 11/28/17  2:39 PM  Result Value Ref Range Status   Specimen Description BLOOD RIGHT ANTECUBITAL  Final   Special Requests   Final    BOTTLES DRAWN AEROBIC AND ANAEROBIC Blood Culture results may not be optimal due to an excessive volume of blood received in culture bottles   Culture   Final    NO GROWTH 3 DAYS Performed at [facility], [address] [place], [place] [address]    Report Status PENDING  Incomplete    [x]  Treated with Keflex, organism resistant to prescribed antimicrobial []  Patient discharged originally without antimicrobial agent and treatment is now indicated  New antibiotic prescription: Amoxicillin 500 mg po BID x 7days  ED Provider: B. Malva LimesMorelli, PA-C   Baldemar FridayMasters, [name] 12/01/2017, 10:29 AM Clinical Pharmacist Monday - Friday phone -  [phone] Saturday - Sunday phone - [phone]

## 2017-12-02 ENCOUNTER — Other Ambulatory Visit: Payer: Self-pay | Admitting: *Deleted

## 2017-12-03 LAB — CULTURE, BLOOD (ROUTINE X 2)
CULTURE: NO GROWTH
CULTURE: NO GROWTH
SPECIAL REQUESTS: ADEQUATE

## 2017-12-09 ENCOUNTER — Other Ambulatory Visit: Payer: Self-pay | Admitting: Internal Medicine

## 2017-12-16 ENCOUNTER — Other Ambulatory Visit: Payer: Self-pay

## 2017-12-16 DIAGNOSIS — M25551 Pain in right hip: Secondary | ICD-10-CM

## 2017-12-17 MED ORDER — FLUTICASONE-SALMETEROL 500-50 MCG/DOSE IN AEPB: INHALATION_SPRAY | RESPIRATORY_TRACT | 3 refills | Status: DC

## 2017-12-17 MED ORDER — MUPIROCIN 2 % EX OINT: 1.0000 "application " | TOPICAL_OINTMENT | Freq: Two times a day (BID) | CUTANEOUS | 3 refills | Status: DC

## 2017-12-29 ENCOUNTER — Telehealth: Payer: Self-pay | Admitting: Internal Medicine

## 2017-12-29 MED ORDER — PREDNISONE 10 MG PO TABS: ORAL_TABLET | ORAL | 0 refills | Status: DC

## 2017-12-29 MED ORDER — AMOXICILLIN-POT CLAVULANATE 875-125 MG PO TABS: 1.0000 | ORAL_TABLET | Freq: Two times a day (BID) | ORAL | 0 refills | Status: DC

## 2017-12-29 NOTE — Telephone Encounter (Signed)
Spoke with patient. She stated that she has had a productive cough with yellow mucus for the past 3 days. She developed a fever yesterday that ran between 100F-101F. Increased fatigue and wheezing. She feels like she is developing PNA again and wants to know if Dr. Maple Hudson will prescribe prednisone and abx.   She wishes to use [facility].   Dr. Maple Hudson, please advise. Thanks!   Allergies  Allergen Reactions  . Acetaminophen Nausea Only  . Asa [Aspirin]   . Hydrocodone-Acetaminophen     REACTION: itching  . Morphine And Related   . Sulfamethoxazole-Trimethoprim Rash   Current Outpatient Medications on File Prior to Visit  Medication Sig Dispense Refill  . albuterol (PROVENTIL) (2.5 MG/3ML) 0.083% nebulizer solution USE 1 VIAL IN NEBULIZER EVERY 4 HOURS AS NEEDED 75 vial prn  . ALPRAZolam (XANAX) 0.5 MG tablet Take 1 tablet by mouth 2 (two) times daily as needed.     . carvedilol (COREG) 3.125 MG tablet Take 3.125 mg by mouth 2 (two) times daily with a meal.    . EPINEPHrine (EPI-PEN) 0.3 mg/0.3 mL DEVI Inject 0.3 mLs (0.3 mg total) into the muscle once. 1 Device prn  . ferrous sulfate 325 (65 FE) MG EC tablet Take 1 tablet by mouth 2 (two) times daily.    . fluticasone (FLONASE) 50 MCG/ACT nasal spray INSTILL 1 SPRAY INTO EACH NOSTRIL TWICE DAILY 16 g 2  . Fluticasone-Salmeterol (ADVAIR DISKUS) 500-50 MCG/DOSE AEPB INHALE 1 PUFF INTO THE LUNGS TWICE DAILY 60 each 3  . furosemide (LASIX) 40 MG tablet Take 1 tablet (40 mg total) by mouth daily as needed. 30 tablet 3  . gabapentin (NEURONTIN) 300 MG capsule Take 1 capsule by mouth at bedtime.    Marland Kitchen ibuprofen (ADVIL,MOTRIN) 800 MG tablet Take 1 tablet (800 mg total) by mouth every 8 (eight) hours as needed. 30 tablet 0  . INCRUSE ELLIPTA 62.5 MCG/INH AEPB INHALE 1 PUFF INTO THE LUNGS DAILY. 30 each 1  . loratadine (CLARITIN) 10 MG tablet Take 1 tablet (10 mg total) by mouth daily. 30 tablet 11  . montelukast (SINGULAIR) 10 MG tablet  TAKE 1 TABLET BY MOUTH AT BEDTIME. 30 tablet 12  . mupirocin ointment (BACTROBAN) 2 % Place 1 application into the nose 2 (two) times daily. 22 g 3  . omeprazole (PRILOSEC) 20 MG capsule TAKE 1 CAPSULE ONCE A DAY. 90 capsule 3  . oxyCODONE (OXYCONTIN) 20 mg 12 hr tablet Take 10 mg by mouth.     . OXYGEN Place 2 L/min into the nose as directed. Oxygen via [place] @@ 2L/min on exertion with stationary concentrator and portable gas tank.    . predniSONE (DELTASONE) 10 MG tablet TAKE 4 TABLETS FOR 2 DAYS, 3 TABLETS FOR 2 DAYS, 2 TABLETS FOR 2 DAYS, 1 TABLET FOR 2 DAYS, THEN STOP. 20 tablet 0  . TRAVATAN Z 0.004 % SOLN ophthalmic solution Place 1 drop into both eyes at bedtime.    . varenicline (CHANTIX) 0.5 MG tablet Take 1 tablet (0.5 mg total) by mouth 2 (two) times daily. 60 tablet 1  . VENTOLIN HFA 108 (90 Base) MCG/ACT inhaler INHALE 2 PUFFS INTO THE LUNGS EVERY 6 (SIX) HOURS AS NEEDED FOR WHEEZING OR SHORTNESS OF BREATH. 18 g 12   No current facility-administered medications on file prior to visit.

## 2017-12-29 NOTE — Telephone Encounter (Signed)
Pt is aware of below recommendations and voiced her understanding. Rx for Augmentin and prednisone has been sent to preferred pharmacy. Nothing further is needed.

## 2017-12-29 NOTE — Telephone Encounter (Signed)
Offer augmentin 875 mg, # 14, 1 twice daily           Prednisone 10 mg, # 20, 4 X 2 DAYS, 3 X 2 DAYS, 2 X 2 DAYS, 1 X 2 DAYS  

## 2018-01-06 ENCOUNTER — Other Ambulatory Visit: Payer: Self-pay | Admitting: Internal Medicine

## 2018-01-07 ENCOUNTER — Other Ambulatory Visit: Payer: Self-pay

## 2018-01-08 MED ORDER — VARENICLINE TARTRATE 1 MG PO TABS: 1.0000 mg | ORAL_TABLET | Freq: Two times a day (BID) | ORAL | 0 refills | Status: DC

## 2018-01-19 ENCOUNTER — Other Ambulatory Visit: Payer: Self-pay | Admitting: Internal Medicine

## 2018-01-19 ENCOUNTER — Telehealth: Payer: Self-pay | Admitting: Internal Medicine

## 2018-01-19 DIAGNOSIS — J441 Chronic obstructive pulmonary disease with (acute) exacerbation: Secondary | ICD-10-CM

## 2018-01-19 MED ORDER — PREDNISONE 10 MG PO TABS: ORAL_TABLET | ORAL | 0 refills | Status: DC

## 2018-01-19 NOTE — Telephone Encounter (Signed)
Spoke with pt, she states she is congested and wheezing really bad. Her cough is productive with yellow and white mucus with a low grade fever. She has a sore throat but thinks it is just because she is coughing so much. II offered her an appt but she has no way to get up here. She tried cough drops and OTC medications for the cough including her breathing treatments. She has taken Mucinex and Vicks vapor rub. She is requesting another ABX and prednisone to help with her symptoms before it gets worse and end up in the hospital.   [facility]  Current Outpatient Medications on File Prior to Visit  Medication Sig Dispense Refill  . albuterol (PROVENTIL) (2.5 MG/3ML) 0.083% nebulizer solution USE 1 VIAL IN NEBULIZER EVERY 4 HOURS AS NEEDED 75 vial prn  . ALPRAZolam (XANAX) 0.5 MG tablet Take 1 tablet by mouth 2 (two) times daily as needed.     Marland Kitchen. amoxicillin-clavulanate (AUGMENTIN) 875-125 MG tablet Take 1 tablet by mouth 2 (two) times daily. 14 tablet 0  . carvedilol (COREG) 3.125 MG tablet Take 3.125 mg by mouth 2 (two) times daily with a meal.    . EPINEPHrine (EPI-PEN) 0.3 mg/0.3 mL DEVI Inject 0.3 mLs (0.3 mg total) into the muscle once. 1 Device prn  . ferrous sulfate 325 (65 FE) MG EC tablet Take 1 tablet by mouth 2 (two) times daily.    . fluticasone (FLONASE) 50 MCG/ACT nasal spray INSTILL 1 SPRAY INTO EACH NOSTRIL TWICE DAILY 16 g 2  . Fluticasone-Salmeterol (ADVAIR DISKUS) 500-50 MCG/DOSE AEPB INHALE 1 PUFF INTO THE LUNGS TWICE DAILY 60 each 3  . furosemide (LASIX) 40 MG tablet Take 1 tablet (40 mg total) by mouth daily as needed. 30 tablet 3  . gabapentin (NEURONTIN) 300 MG capsule Take 1 capsule by mouth at bedtime.    Marland Kitchen. ibuprofen (ADVIL,MOTRIN) 800 MG tablet Take 1 tablet (800 mg total) by mouth every 8 (eight) hours as needed. 30 tablet 0  . INCRUSE ELLIPTA 62.5 MCG/INH AEPB INHALE 1 PUFF INTO THE LUNGS DAILY. 30 each 1  . loratadine (CLARITIN) 10 MG tablet Take 1  tablet (10 mg total) by mouth daily. 30 tablet 11  . montelukast (SINGULAIR) 10 MG tablet TAKE 1 TABLET BY MOUTH AT BEDTIME. 30 tablet 2  . mupirocin ointment (BACTROBAN) 2 % Place 1 application into the nose 2 (two) times daily. 22 g 3  . omeprazole (PRILOSEC) 20 MG capsule TAKE 1 CAPSULE ONCE A DAY. 90 capsule 3  . oxyCODONE (OXYCONTIN) 20 mg 12 hr tablet Take 10 mg by mouth.     . OXYGEN Place 2 L/min into the nose as directed. Oxygen via [place] @@ 2L/min on exertion with stationary concentrator and portable gas tank.    . predniSONE (DELTASONE) 10 MG tablet TAKE 4 TABLETS FOR 2 DAYS, 3 TABLETS FOR 2 DAYS, 2 TABLETS FOR 2 DAYS, 1 TABLET FOR 2 DAYS, THEN STOP. 20 tablet 0  . predniSONE (DELTASONE) 10 MG tablet 4 tab x 2 days, 3 tabs x 2 days, 2 tabs x 2 days, 1 tab x 2 days 20 tablet 0  . TRAVATAN Z 0.004 % SOLN ophthalmic solution Place 1 drop into both eyes at bedtime.    . varenicline (CHANTIX) 1 MG tablet Take 1 tablet (1 mg total) by mouth 2 (two) times daily. 30 tablet 0  . VENTOLIN HFA 108 (90 Base) MCG/ACT inhaler INHALE 2 PUFFS INTO THE LUNGS EVERY 6 HOURS AS  NEEDED FOR WHEEZING OR SHORTNESS OF BREATH. 18 g 12   No current facility-administered medications on file prior to visit.    Allergies  Allergen Reactions  . Acetaminophen Nausea Only  . Asa [Aspirin]   . Hydrocodone-Acetaminophen     REACTION: itching  . Morphine And Related   . Sulfamethoxazole-Trimethoprim Rash

## 2018-01-19 NOTE — Telephone Encounter (Signed)
CY Please advise on refill. Thanks.  

## 2018-01-19 NOTE — Telephone Encounter (Signed)
Ok to refill 

## 2018-01-19 NOTE — Telephone Encounter (Signed)
Patient aware that Abx has been sent to pharmacy earlier and I just sent the Prednisone taper Rx to [facility]. Nothing more needed at this time.

## 2018-01-19 NOTE — Telephone Encounter (Signed)
I think I approved antibiotic refill earlier today.  Ok to Rx prednisone taper 10 mg, # 20, 4 X 2 DAYS, 3 X 2 DAYS, 2 X 2 DAYS, 1 X 2 DAYS  She is calling too often for meds without being seen. Please ask her to see her pcp to be reassessed in her own town if she can't get up to [place].

## 2018-02-06 ENCOUNTER — Other Ambulatory Visit: Payer: Self-pay | Admitting: Internal Medicine

## 2018-02-15 ENCOUNTER — Other Ambulatory Visit: Payer: Self-pay

## 2018-02-15 ENCOUNTER — Telehealth: Payer: Self-pay | Admitting: *Deleted

## 2018-02-15 MED ORDER — VARENICLINE TARTRATE 1 MG PO TABS: 1.0000 mg | ORAL_TABLET | Freq: Two times a day (BID) | ORAL | 2 refills | Status: DC

## 2018-02-15 NOTE — Telephone Encounter (Signed)
Pharmacy called. Chantix comes in a quantity of #56 in a bottle.  Can we change to #56? Fleeger, Maryjo RochesterJessica Dawn, CMA

## 2018-02-16 NOTE — Telephone Encounter (Signed)
[facility]. Dagen Beevers, Maryjo RochesterJessica Dawn, CMA

## 2018-02-16 NOTE — Telephone Encounter (Signed)
Yes okay to change quantity to #56

## 2018-02-17 ENCOUNTER — Ambulatory Visit: Payer: Medicare Other | Admitting: Internal Medicine

## 2018-04-09 ENCOUNTER — Telehealth: Payer: Self-pay | Admitting: Internal Medicine

## 2018-04-09 MED ORDER — DOXYCYCLINE HYCLATE 100 MG PO TABS: 100.0000 mg | ORAL_TABLET | Freq: Two times a day (BID) | ORAL | 0 refills | Status: DC

## 2018-04-09 NOTE — Telephone Encounter (Signed)
Primary Pulmonologist: Dr Maple Hudson Last office visit and with whom: 03/27/17- Dr Maple Hudson What do we see them for (pulmonary problems): COPD, lung nodules Last OV assessment/plan:     Instructions      Return in about 4 months (around 07/25/2017).  Order- CT chest no contrast      Dx lung nodules, tobacco user  Depo 80    Dx exacerbation COPD  Script for Chantix  Script for amoxacillin sent       Was appointment offered to patient (explain)?  Yes, Patient declined.  She had recent hip surgery.  She is non weight bearing on her right leg.   Reason for call: She stated that she started coughing a week ago and it has gradually gotten worse. She stated that she started having a productive cough yesterday, with yellow phlegm, wheezing, and chest congestion.  She stated that she has had fever, 99-100.  She has been taking Ibuprofen and a OTC cough medication.  She is requesting any prescriptions to be sent to [facility].  Will route to Dr Maple Hudson

## 2018-04-09 NOTE — Telephone Encounter (Signed)
Offer doxycycline 100 mg, # 14, 1 twice daily 

## 2018-04-09 NOTE — Telephone Encounter (Signed)
Called and spoke with patient.  Dr Maple Hudson recommendations given. Understanding stated.  Prescription sent to [facility].  Nothing further at this time.

## 2018-04-15 ENCOUNTER — Telehealth: Payer: Self-pay | Admitting: Internal Medicine

## 2018-04-15 MED ORDER — UMECLIDINIUM BROMIDE 62.5 MCG/INH IN AEPB: 1.0000 | INHALATION_SPRAY | Freq: Every day | RESPIRATORY_TRACT | 5 refills | Status: DC

## 2018-04-15 NOTE — Telephone Encounter (Signed)
Ok to refill x 6 months total 

## 2018-04-15 NOTE — Telephone Encounter (Signed)
Patient called requesting a refill of Incruse to be sent to [facility].  Last OV was 03/27/17.  Dr Maple Hudson please advise

## 2018-04-15 NOTE — Telephone Encounter (Signed)
Called and spoke with Patient.  Incruse prescription with 5 refills sent to [facility].  Understanding stated.  Nothing further at this time.

## 2018-05-05 ENCOUNTER — Telehealth: Payer: Self-pay | Admitting: Internal Medicine

## 2018-05-05 MED ORDER — AZITHROMYCIN 250 MG PO TABS: ORAL_TABLET | ORAL | 0 refills | Status: DC

## 2018-05-05 NOTE — Telephone Encounter (Signed)
Called and spoke with Patient.  [name] is a Dr Maple Hudson Patient, last seen 03/2017, for COPD.  [name] stated that [name] has head congestion, stuffy nose, productive cough, with yellow/lt green phlegm, and low grade fever. [name] stated that it started over the weekend and has gotten gradually worse.  [name] has been taking a OTC cough medication, with little improvement.  [name] is using her albuterol nebs as needed, that does help.  [name] declined office visit, because [name] is still recovering from recent hip surgery.  [name] is requesting any prescriptions to be sent to [facility].  Message routed to Dr Maple Hudson  Allergies  Allergen Reactions  . Acetaminophen Nausea Only  . Asa [Aspirin]   . Hydrocodone-Acetaminophen     REACTION: itching  . Morphine And Related   . Sulfamethoxazole-Trimethoprim Rash   Current Outpatient Medications on File Prior to Visit  Medication Sig Dispense Refill  . albuterol (PROVENTIL) (2.5 MG/3ML) 0.083% nebulizer solution USE 1 VIAL IN NEBULIZER EVERY 4 HOURS AS NEEDED 75 vial prn  . ALPRAZolam (XANAX) 0.5 MG tablet Take 1 tablet by mouth 2 (two) times daily as needed.     Marland Kitchen amoxicillin-clavulanate (AUGMENTIN) 875-125 MG tablet TAKE ONE TABLET BY MOUTH TWICE DAILY 14 tablet 0  . carvedilol (COREG) 3.125 MG tablet Take 3.125 mg by mouth 2 (two) times daily with a meal.    . doxycycline (VIBRA-TABS) 100 MG tablet Take 1 tablet (100 mg total) by mouth 2 (two) times daily. 14 tablet 0  . EPINEPHrine (EPI-PEN) 0.3 mg/0.3 mL DEVI Inject 0.3 mLs (0.3 mg total) into the muscle once. 1 Device prn  . ferrous sulfate 325 (65 FE) MG EC tablet Take 1 tablet by mouth 2 (two) times daily.    . fluticasone (FLONASE) 50 MCG/ACT nasal spray INSTILL 1 SPRAY INTO EACH NOSTRIL TWICE DAILY 16 g 2  . Fluticasone-Salmeterol (ADVAIR DISKUS) 500-50 MCG/DOSE AEPB INHALE 1 PUFF INTO THE LUNGS TWICE DAILY 60 each 3  . furosemide (LASIX) 40 MG tablet Take 1 tablet (40 mg total) by mouth daily as needed. 30  tablet 3  . gabapentin (NEURONTIN) 300 MG capsule Take 1 capsule by mouth at bedtime.    Marland Kitchen ibuprofen (ADVIL,MOTRIN) 800 MG tablet Take 1 tablet (800 mg total) by mouth every 8 (eight) hours as needed. 30 tablet 0  . loratadine (CLARITIN) 10 MG tablet Take 1 tablet (10 mg total) by mouth daily. 30 tablet 11  . montelukast (SINGULAIR) 10 MG tablet TAKE 1 TABLET BY MOUTH AT BEDTIME. 30 tablet 2  . mupirocin ointment (BACTROBAN) 2 % Place 1 application into the nose 2 (two) times daily. 22 g 3  . omeprazole (PRILOSEC) 20 MG capsule TAKE 1 CAPSULE ONCE A DAY. 90 capsule 3  . oxyCODONE (OXYCONTIN) 20 mg 12 hr tablet Take 10 mg by mouth.     . OXYGEN Place 2 L/min into the nose as directed. Oxygen via [place] @@ 2L/min on exertion with stationary concentrator and portable gas tank.    . predniSONE (DELTASONE) 10 MG tablet TAKE 4 TABLETS FOR 2 DAYS, 3 TABLETS FOR 2 DAYS, 2 TABLETS FOR 2 DAYS, 1 TABLET FOR 2 DAYS, THEN STOP. 20 tablet 0  . predniSONE (DELTASONE) 10 MG tablet 4 tab x 2 days, 3 tabs x 2 days, 2 tabs x 2 days, 1 tab x 2 days 20 tablet 0  . predniSONE (DELTASONE) 10 MG tablet Take 4 x 2days,3x3days,2x2days, 1 x2days then stop 20 tablet 0  .  TRAVATAN Z 0.004 % SOLN ophthalmic solution Place 1 drop into both eyes at bedtime.    Marland Kitchen umeclidinium bromide (INCRUSE ELLIPTA) 62.5 MCG/INH AEPB Inhale 1 puff into the lungs daily. 30 each 5  . varenicline (CHANTIX) 1 MG tablet Take 1 tablet (1 mg total) by mouth 2 (two) times daily. 30 tablet 2  . VENTOLIN HFA 108 (90 Base) MCG/ACT inhaler INHALE 2 PUFFS INTO THE LUNGS EVERY 6 HOURS AS NEEDED FOR WHEEZING OR SHORTNESS OF BREATH. 18 g 12   No current facility-administered medications on file prior to visit.

## 2018-05-05 NOTE — Telephone Encounter (Signed)
Med list shows augmentin and doxycycline. If she is taking either of these, just finish the course. If she is not taking either one, please remove from med list and offer Zpak 250 mg, # 6, 2 today then one daily  Ok to take otc cold and flu remedies like Mucinex-DM or Tylenol Cold and Flu, if they are helpful

## 2018-05-05 NOTE — Telephone Encounter (Signed)
Call made to patient, made aware of CY recommendations. Confirmed pharmacy, pack sent in. Voiced understanding. Nothing further is needed at this time.

## 2018-05-10 ENCOUNTER — Other Ambulatory Visit: Payer: Self-pay | Admitting: Internal Medicine

## 2018-05-10 DIAGNOSIS — J441 Chronic obstructive pulmonary disease with (acute) exacerbation: Secondary | ICD-10-CM

## 2018-05-11 ENCOUNTER — Telehealth: Payer: Self-pay | Admitting: Internal Medicine

## 2018-05-11 MED ORDER — ALBUTEROL SULFATE HFA 108 (90 BASE) MCG/ACT IN AERS: INHALATION_SPRAY | RESPIRATORY_TRACT | 0 refills | Status: DC

## 2018-05-11 NOTE — Telephone Encounter (Signed)
Spoke with pt and advised rx for albuterol sent to pharmacy. Nothing further is needed.

## 2018-06-28 ENCOUNTER — Ambulatory Visit: Payer: Medicare Other | Admitting: Internal Medicine

## 2018-07-08 ENCOUNTER — Other Ambulatory Visit: Payer: Self-pay

## 2018-07-08 DIAGNOSIS — I10 Essential (primary) hypertension: Secondary | ICD-10-CM

## 2018-07-08 DIAGNOSIS — M25551 Pain in right hip: Secondary | ICD-10-CM

## 2018-07-09 MED ORDER — AMLODIPINE BESYLATE 10 MG PO TABS: 10.0000 mg | ORAL_TABLET | Freq: Every day | ORAL | 3 refills | Status: DC

## 2018-07-09 MED ORDER — FUROSEMIDE 40 MG PO TABS: 40.0000 mg | ORAL_TABLET | Freq: Every day | ORAL | 3 refills | Status: DC | PRN

## 2018-07-13 ENCOUNTER — Other Ambulatory Visit: Payer: Self-pay | Admitting: Internal Medicine

## 2018-07-13 ENCOUNTER — Other Ambulatory Visit: Payer: Self-pay

## 2018-07-13 DIAGNOSIS — M25551 Pain in right hip: Secondary | ICD-10-CM

## 2018-07-13 NOTE — Telephone Encounter (Signed)
This is not a chronic medication. Is this a patient request or a pharmacy request? If pharmacy please decline. If patient driven then I will call

## 2018-07-13 NOTE — Telephone Encounter (Signed)
Spoke with the pharmacy. The tech said that they are the ones that faxed over the medication. This was not pt requested. Aquilla Solian, CMA

## 2018-07-22 ENCOUNTER — Telehealth: Payer: Self-pay | Admitting: Internal Medicine

## 2018-07-22 MED ORDER — AMOXICILLIN-POT CLAVULANATE 875-125 MG PO TABS: 1.0000 | ORAL_TABLET | Freq: Two times a day (BID) | ORAL | 0 refills | Status: DC

## 2018-07-22 NOTE — Telephone Encounter (Signed)
Offer augmentin 875 mg, # 14, 1 twice daily 

## 2018-07-22 NOTE — Telephone Encounter (Signed)
Primary Pulmonologist: CY Last office visit and with whom: 03/27/17 with CY What do we see them for (pulmonary problems): COPD and respiratory failure Last OV assessment/plan:Assessment & Plan Note by Waymon Budge, MD at 03/28/2017 3:35 PM  Author: Waymon Budge, MD Author Type: Physician Filed: 03/28/2017 3:36 PM  Note Status: Written Cosign: Cosign Not Required Encounter Date: 03/27/2017  Problem: Lung nodules  Editor: Waymon Budge, MD (Physician)    Last ordered CT not done because hospitalized for hip surgery. Plan-CT chest for lung nodules    Assessment & Plan Note by Waymon Budge, MD at 03/28/2017 3:30 PM  Author: Waymon Budge, MD Author Type: Physician Filed: 03/28/2017 3:30 PM  Note Status: Written Cosign: Cosign Not Required Encounter Date: 03/27/2017  Problem: Tobacco use  Editor: Waymon Budge, MD (Physician)    Asks to try Chantix again-discussed Plan-Chantix with strong support for smoking cessation    Assessment & Plan Note by Waymon Budge, MD at 03/28/2017 3:28 PM  Author: Waymon Budge, MD Author Type: Physician Filed: 03/28/2017 3:29 PM  Note Status: Written Cosign: Cosign Not Required Encounter Date: 03/27/2017  Problem: COPD exacerbation  Editor: Waymon Budge, MD (Physician)    Likely infection rather than reaction to flu shot.   Plan -amoxicillin medication talk    Patient Instructions by Waymon Budge, MD at 03/27/2017 11:00 AM  Author: Waymon Budge, MD Author Type: Physician Filed: 03/27/2017 11:32 AM  Note Status: Addendum Cosign: Cosign Not Required Encounter Date: 03/27/2017  Editor: Waymon Budge, MD (Physician)  Prior Versions: 1. Waymon Budge, MD (Physician) at 03/27/2017 11:29 AM - Signed    Order- CT chest no contrast      Dx lung nodules, tobacco user  Depo 80    Dx exacerbation COPD  Script for Chantix  Script for amoxacillin sent        Was appointment offered to patient (explain)?  Yes, but  patient refused due to her still recovering from hip surgery.    Reason for call: Spoke with patient. She stated that she woke up yesterday and had a headache and deep cough. The cough gradually got worse throughout the day. She is now coughing up green phlegm. She also has some increased SOB. She denied any fevers or body aches. Also denied being around anyone with covid. She stated that she gets like this after abnormal weather changes.   Offered appt to patient since she hadn't been seen since early 2019 but she declined due to her still recovering from hip surgery.   Pharmacy is [facility] in [place].   CY, please advise. Thanks!

## 2018-07-22 NOTE — Telephone Encounter (Signed)
Spoke with patient. She verbalized understanding of CY's recs. Will go ahead and call in medication for patient.   Nothing further needed at time of call.

## 2018-07-29 ENCOUNTER — Telehealth: Payer: Self-pay | Admitting: Internal Medicine

## 2018-07-29 MED ORDER — DOXYCYCLINE HYCLATE 100 MG PO TABS: ORAL_TABLET | ORAL | 0 refills | Status: DC

## 2018-07-29 NOTE — Telephone Encounter (Signed)
Spoke with pt and relayed recommendations.  Nothing further is needed.

## 2018-07-29 NOTE — Telephone Encounter (Signed)
We just sent 7 days of augmentin on 5/14- should be just ended. Did that help at all?

## 2018-07-29 NOTE — Telephone Encounter (Signed)
Primary Pulmonologist: Dr. Maple Hudson Last office visit and with whom: 03/27/17 CY What do we see them for (pulmonary problems):lung nodules  Last OV assessment/plan:  Order- CT chest no contrast      Dx lung nodules, tobacco user  Depo 80    Dx exacerbation COPD  Script for Chantix  Script for amoxacillin sent  Was appointment offered to patient (explain)?  She wanted to hear recommendations first   Reason for call: pt c/o head and chest congestion.  Post nasal drip.  Cough with light green mucus.  Started on Tuesday.  Afebrile, highest of 99.  Normal wheezing.  Using nebs with little relief.  Using OTC cough med with no relief.    Dr. Maple Hudson, please advise.    (examples of things to ask: : When did symptoms start? Fever? Cough? Productive? Color to sputum? More sputum than usual? Wheezing? Have you needed increased oxygen? Are you taking your respiratory medications? What over the counter measures have you tried?)

## 2018-07-29 NOTE — Telephone Encounter (Signed)
Offer doxycycline 100 mg, # 15, 2 today then one daily  Suggest Mucinex- DM to help with cough and mucus.

## 2018-07-29 NOTE — Telephone Encounter (Signed)
Called pt to see if the abx she was previously prescribed help with her symptoms and pt stated that it did help but shortly after finishing the abx, due to her boys being sick, her symptoms started all over again.

## 2018-08-07 ENCOUNTER — Other Ambulatory Visit: Payer: Self-pay | Admitting: Internal Medicine

## 2018-08-07 DIAGNOSIS — J441 Chronic obstructive pulmonary disease with (acute) exacerbation: Secondary | ICD-10-CM

## 2018-08-12 ENCOUNTER — Other Ambulatory Visit: Payer: Self-pay | Admitting: Internal Medicine

## 2018-10-08 ENCOUNTER — Telehealth: Payer: Self-pay | Admitting: Internal Medicine

## 2018-10-08 MED ORDER — AMOXICILLIN-POT CLAVULANATE 875-125 MG PO TABS: 1.0000 | ORAL_TABLET | Freq: Two times a day (BID) | ORAL | 0 refills | Status: DC

## 2018-10-08 NOTE — Telephone Encounter (Signed)
Yes. Her appointment needs to be an e-visit. What type of surgery did she have and when was the surgery?

## 2018-10-08 NOTE — Telephone Encounter (Signed)
Called and spoke with pt letting her know that we did clarify that her visit on Monday 8/3 with CY did need to be the e-visit so we will leave the appt as is and pt verbalized understanding.  Asked pt what type of surgery did she have and pt said that she now has had a total of 7 hip surgeries. Pt's most recent hip surgery was 02/10/18.  Pt said that each time when they put prosthesis in her hip, the area becomes infected and they have to go and do surgery to remove the infected prosthesis. Pt said after the last time this happened, she told them not to put another prosthesis in since her body keeps on rejecting it.

## 2018-10-08 NOTE — Telephone Encounter (Signed)
Primary Pulmonologist: CY Last office visit and with whom: 03/27/2017 w/ CY What do we see them for (pulmonary problems): COPD, Tobacco use, Lung nodules  Reason for call: I originally called patient for COVID-19 screen for her appt 10/11/2018 w/ CY. Pt states she woke up this morning sick. Reports cough w/ yellow-white mucus, notable wheezing, shortness of breath on exertion, and diarrhea. Pt denies fever/chills/muscle aches. She states she is "confined to her bed d/t surgery." She reports taking her nebulizers and breathing medications as directed and is also taking an OTC cough medication.   In the last month, have you been in contact with someone who was confirmed or suspected to have [place]avirus / COVID-19?  No  Do you have any of the following symptoms developed in the last 30 days? Fever: No Cough: Yes, with yellow-white mucus. Shortness of breath: Yes, on exertion.  When did your symptoms start?  This morning 10/08/2018  If the patient has a fever, what is the last reading?  (use n/a if patient denies fever)  N/A . IF THE PATIENT STATES THEY DO NOT OWN A THERMOMETER, THEY MUST GO AND PURCHASE ONE When did the fever start?: N/A Have you taken any medication to suppress a fever (ie Ibuprofen, Aleve, Tylenol)?: N/A  I have made her appt w/ CY 10/11/2018 a telephone visit since she is unable to leave her home right now and since she is sick. I am also going to route this message to our provider of the day since CY is out of the office for the rest of the day.   TN, please advise with your recommendations for this patient. Thank you.

## 2018-10-08 NOTE — Telephone Encounter (Signed)
Called and spoke with pt letting her know that augmentin abx was sent to pharmacy for her. Pt verbalized understanding. Nothing further needed.

## 2018-10-08 NOTE — Telephone Encounter (Signed)
I sent in a prescription for Augmentin - please keep upcoming e-visit with Dr. Annamaria Boots

## 2018-10-11 ENCOUNTER — Other Ambulatory Visit: Payer: Self-pay

## 2018-10-11 ENCOUNTER — Ambulatory Visit (INDEPENDENT_AMBULATORY_CARE_PROVIDER_SITE_OTHER): Payer: Medicare Other | Admitting: Internal Medicine

## 2018-10-11 ENCOUNTER — Encounter: Payer: Self-pay | Admitting: Internal Medicine

## 2018-10-11 DIAGNOSIS — J9611 Chronic respiratory failure with hypoxia: Secondary | ICD-10-CM | POA: Diagnosis not present

## 2018-10-11 DIAGNOSIS — Z720 Tobacco use: Secondary | ICD-10-CM | POA: Diagnosis not present

## 2018-10-11 NOTE — Progress Notes (Signed)
HPI female one pack per day smoker followed for COPD mixed type, Chronic Respiratory Failure Hypoxic,, lung nodules, seasonal allergic rhinitis, tobacco abuse, complicated by hip surgery/infection/repair, anxiety/depression, GERD, HBP, obesity, Husband is a patient here ( blind and on dialysis)  Allergy skin test- significant positives for grass weed and tree pollens, dust mite, cockroach. Office Spirometry-08/19/2013-severe obstructive airways disease. FVC 2.05/71%, FEV1 1.04/42%, FEV1/FVC 0.51, FEF 25-75%  0.50/17% Office spirometry 07/06/14-severe obstructive airways disease. FVC 2.37/79%, FEV1 0.92/37%, FEV1/FVC 0.39, FEF 25-75 percent 0.26/9%  -----------------------------------------------------------------------------------------  03/27/17- 53 year old female one pack per day smoker followed for COPD mixed type, lung nodules, seasonal allergic rhinitis, tobacco abuse, complicated by hip surgery/infection/repair, anxiety/depression, GERD, HBP, obesity, O2 2 L ----Pt received flu shot on monday and has felt more congested/more coughing ever since. she is using nebulizer txs every 4 hours to help with symptoms.  CT chest 05/12/16-never done Starting the day or 2 after flu shot she has felt increased head and chest congestion with green mucus from nose and chest but no distinct fever.  No blood or chest pain.  Feels need for continuous oxygen now.  Establish a new DME.  Rescue inhaler 2 or 3 times daily and continues Incruse, Advair, Singulair.  Off prednisone. Need to update lung nodule status  10/11/2018- Virtual Visit via Telephone Note  I connected with Dyanne IhaNancy L Stgermain on 10/11/18 at  2:00 PM EDT by telephone and verified that I am speaking with the correct person using two identifiers.  Location: Patient: H Provider: O   I discussed the limitations, risks, security and privacy concerns of performing an evaluation and management service by telephone and the availability of in person  appointments. I also discussed with the patient that there may be a patient responsible charge related to this service. The patient expressed understanding and agreed to proceed.   History of Present Illness: 53 year old female one pack per day smoker followed for COPD mixed type, lung nodules, Chronic resp failure hypoxic, seasonal allergic rhinitis, tobacco abuse, complicated by hip surgery/infection/repair, anxiety/depression, GERD, HBP, obesity, O2 2 L continuous  Incruse, Singulair, Claritin,  Advair 500, Chantix, albuterol hfa, neb albuterol,  -----(see telephone note 07/31) pt states she is doing better since augmentin, OTC cough medication, and breathing treatments; reports cough w/ white mucus Says quit smoking "1 weeks ago"- strongly supported in our discussion. Latest hip surgery 02/10/18- repeated complications. She doesn't intend to allow any more hips surgery. Has Home Health RN. We will ask her to get O2 sat at rest on room air to qualify for O2.  Home oximetry usually 95-98% on O2 2L.  Needs new O2 supplier. Wants Inogen 1 machine at 2L.  Hosp this Spring with pneumonia. Sputum now white as she finishes course of augmentin we called in 7/31.  Observations/Objective:   Assessment and Plan: Chronic resp failure hypoxic- HHRN to get O2 sat rest room air to qualify for Inogen POC COPD mixed type- chronic smoker. Meds sufficient if she can stay off cigs.  Tobacco- supporting cessation Follow Up Instructions: 6 months  I discussed the assessment and treatment plan with the patient. The patient was provided an opportunity to ask questions and all were answered. The patient agreed with the plan and demonstrated an understanding of the instructions.   The patient was advised to call back or seek an in-person evaluation if the symptoms worsen or if the condition fails to improve as anticipated.  I provided 21 minutes of non-face-to-face time during this encounter.  Baird Lyons,  MD    ROS-see HPI     + = positive Constitutional:   No-   weight loss, night sweats, fevers, chills, fatigue, lassitude. HEENT:   No-  headaches, difficulty swallowing, tooth/dental problems, no-sore throat,       No- sneezing, no-itching, ear ache, +nasal congestion,+ post nasal drip,  CV:  No-   chest pain, orthopnea, PND,+ swelling in lower extremities, no-anasarca,                                                       dizziness, palpitations Resp: + shortness of breath with exertion or at rest.             + productive cough, + non-productive cough,  No- coughing up of blood.              No-   change in color of mucus.  No- wheezing.   Skin: No-   rash or lesions. GI:  + heartburn, indigestion, No-abdominal pain, nausea, vomiting,  GU:  MS:  + joint pain or swelling.   Neuro-     nothing unusual Psych:  No- change in mood or affect. + depression or anxiety.  No memory loss.  OBJ- Physical Exam General- Alert, Oriented, Affect-appropriate, Distress- none acute. + Overweight, + talkative + wheelchair,                                     +O2 Skin- rash-none, lesions- none, excoriation- none Lymphadenopathy- none Head- atraumatic            Eyes- Gross vision intact, PERRLA, conjunctivae and secretions clear            Ears- Hearing, canals-normal            Nose- + turbinate edema, no-Septal dev, +mucus bridging, polyps, erosion, perforation             Throat- Mallampati III , mucosa clear , drainage- none, tonsils- atrophic, + dentures,  Neck- flexible , trachea midline, no stridor , thyroid nl, carotid no bruit Chest - symmetrical excursion , unlabored           Heart/CV- RRR , no murmur , no gallop  , no rub, nl s1 s2                           - JVD- none , edema- none, stasis changes- none, varices- none           Lung-  wheeze + coarse/unlabored, cough+ upper airway rattle,  dullness-none, rub- none           Chest wall-  Abd-  Br/ Gen/ Rectal- Not done, not  indicated Extrem- cyanosis- none, clubbing, none, atrophy- none, strength- nl, + wheelchair Neuro- grossly intact to observation

## 2018-10-11 NOTE — Patient Instructions (Signed)
We will contact your home health nurse Toney Sang with [facility] [phone], to see if we can order a room air O2 saturation. This might be enough to qualify to order you an Inogen portable O2 concentrator, 2L/ min  We will make sure your chart shows allergy to cephalexin and sulfa drugs with itching and rash.  I would like to try to keep you off antibiotics and prednisone for a while, if possible, to let your system settle down.  Please call if you need [place].

## 2018-10-16 ENCOUNTER — Other Ambulatory Visit: Payer: Self-pay | Admitting: Internal Medicine

## 2018-10-16 DIAGNOSIS — J441 Chronic obstructive pulmonary disease with (acute) exacerbation: Secondary | ICD-10-CM

## 2018-10-18 ENCOUNTER — Telehealth: Payer: Self-pay | Admitting: Internal Medicine

## 2018-10-18 MED ORDER — AMOXICILLIN-POT CLAVULANATE 875-125 MG PO TABS: 1.0000 | ORAL_TABLET | Freq: Two times a day (BID) | ORAL | 0 refills | Status: DC

## 2018-10-18 NOTE — Telephone Encounter (Signed)
abx has been sent to pt's preferred pharmacy. Called and spoke with pt letting her know that CY said to try abx to see if it would help. Stated to her if no better after abx to call office and we would rediscuss it with CY. Pt verbalized understanding. Nothing further needed.

## 2018-10-18 NOTE — Telephone Encounter (Signed)
Augmentin 875 mg, # 14, 1 twice daily  Stay well- hydrated

## 2018-10-18 NOTE — Telephone Encounter (Signed)
Pt was prescribed Augmentin by Angus Selleronya Nichols 7/31. At pt's last OV with CY 10/11/2018, it was mentioned that Dr. Maple HudsonYoung wanted to try to keep pt off of antibiotics and prednisone for a while if possible to let her system settle down.  Called and spoke with pt who stated she woke up this morning and began coughing up green phlegm and was also running a temp of 101.1  Due to pt's temp, pt took advil to see if that would help. Pt had taken Advil around 2pm shortly after calling our office.  Pt denies any complaints of chills.   Had pt take temp while speaking to her on the phone and pt said her temp was 101.3  Pt also has complaints of wheezing, has tightness in chest and also has SOB especially when she moves.  Pt has had to use her rescue inhaler about 5 times today and is using nebulizer every 3-4 hours. Pt is also using the incruse and advair as prescribed. Pt has taken OTC cough syrup which is the generic for nyquil to see if that would help with her cough but pt said that she still is having problems. Pt stated "I feel like I have been hit by a truck."  Pt said that she was fine over the weekend and then all this began this morning when she woke up.  Pt wants to know what we recommend to help with her symptoms. Dr. Maple HudsonYoung, please advise on this for pt. Thanks!  Allergies  Allergen Reactions  . Acetaminophen Nausea Only  . Asa [Aspirin]   . Hydrocodone-Acetaminophen     REACTION: itching  . Morphine And Related   . Sulfamethoxazole-Trimethoprim Rash     Current Outpatient Medications:  .  albuterol (PROVENTIL) (2.5 MG/3ML) 0.083% nebulizer solution, USE 1 VIAL IN NEBULIZER EVERY 4 HOURS AS NEEDED, Disp: 75 vial, Rfl: prn .  albuterol (VENTOLIN HFA) 108 (90 Base) MCG/ACT inhaler, INHALE 2 PUFFS INTO THE LUNGS EVERY 6 HOURS AS NEEDED FOR WHEEZING OR SHORTNESS OF BREATH., Disp: 36 g, Rfl: 0 .  ALPRAZolam (XANAX) 0.5 MG tablet, Take 1 tablet by mouth 2 (two) times daily as needed. , Disp: ,  Rfl:  .  amLODipine (NORVASC) 10 MG tablet, Take 1 tablet (10 mg total) by mouth at bedtime., Disp: 90 tablet, Rfl: 3 .  amoxicillin-clavulanate (AUGMENTIN) 875-125 MG tablet, Take 1 tablet by mouth 2 (two) times daily., Disp: 14 tablet, Rfl: 0 .  carvedilol (COREG) 3.125 MG tablet, Take 3.125 mg by mouth 2 (two) times daily with a meal., Disp: , Rfl:  .  CHANTIX 1 MG tablet, T1T2D, Disp: 56 tablet, Rfl: 1 .  EPINEPHrine (EPI-PEN) 0.3 mg/0.3 mL DEVI, Inject 0.3 mLs (0.3 mg total) into the muscle once., Disp: 1 Device, Rfl: prn .  ferrous sulfate 325 (65 FE) MG EC tablet, Take 1 tablet by mouth 2 (two) times daily., Disp: , Rfl:  .  fluticasone (FLONASE) 50 MCG/ACT nasal spray, INSTILL 1 SPRAY INTO EACH NOSTRIL TWICE DAILY, Disp: 16 g, Rfl: 2 .  Fluticasone-Salmeterol (ADVAIR DISKUS) 500-50 MCG/DOSE AEPB, INHALE 1 PUFF INTO THE LUNGS TWICE DAILY, Disp: 60 each, Rfl: 3 .  furosemide (LASIX) 40 MG tablet, Take 1 tablet (40 mg total) by mouth daily as needed., Disp: 30 tablet, Rfl: 3 .  gabapentin (NEURONTIN) 300 MG capsule, Take 1 capsule by mouth at bedtime., Disp: , Rfl:  .  ibuprofen (ADVIL,MOTRIN) 800 MG tablet, Take 1 tablet (800 mg total) by mouth  every 8 (eight) hours as needed., Disp: 30 tablet, Rfl: 0 .  loratadine (CLARITIN) 10 MG tablet, Take 1 tablet (10 mg total) by mouth daily., Disp: 30 tablet, Rfl: 11 .  montelukast (SINGULAIR) 10 MG tablet, TAKE 1 TABLET BY MOUTH AT BEDTIME., Disp: 30 tablet, Rfl: 6 .  mupirocin ointment (BACTROBAN) 2 %, Place 1 application into the nose 2 (two) times daily., Disp: 22 g, Rfl: 3 .  omeprazole (PRILOSEC) 20 MG capsule, TAKE 1 CAPSULE ONCE A DAY., Disp: 90 capsule, Rfl: 3 .  oxyCODONE (OXYCONTIN) 20 mg 12 hr tablet, Take 10 mg by mouth. , Disp: , Rfl:  .  OXYGEN, Place 2 L/min into the nose as directed. Oxygen via [place] @@ 2L/min on exertion with stationary concentrator and portable gas tank., Disp: , Rfl:  .  TRAVATAN Z 0.004 % SOLN ophthalmic solution,  Place 1 drop into both eyes at bedtime., Disp: , Rfl:  .  umeclidinium bromide (INCRUSE ELLIPTA) 62.5 MCG/INH AEPB, Inhale 1 puff into the lungs daily., Disp: 30 each, Rfl: 5

## 2018-11-10 ENCOUNTER — Other Ambulatory Visit: Payer: Self-pay | Admitting: Internal Medicine

## 2018-11-23 ENCOUNTER — Other Ambulatory Visit: Payer: Self-pay | Admitting: *Deleted

## 2018-11-23 DIAGNOSIS — K219 Gastro-esophageal reflux disease without esophagitis: Secondary | ICD-10-CM

## 2018-11-24 MED ORDER — OMEPRAZOLE 20 MG PO CPDR: DELAYED_RELEASE_CAPSULE | ORAL | 3 refills | Status: DC

## 2018-11-26 ENCOUNTER — Other Ambulatory Visit: Payer: Self-pay

## 2018-12-01 ENCOUNTER — Encounter: Payer: Self-pay | Admitting: Internal Medicine

## 2018-12-01 NOTE — Assessment & Plan Note (Signed)
We will ask HHRN to get O2 qualifying room air O2 sat at rest. Patient would like an Inogen 1 POC

## 2018-12-01 NOTE — Assessment & Plan Note (Signed)
She reports quit smoking 1 week ago. Strongly encouraged to stick with this.

## 2018-12-14 ENCOUNTER — Other Ambulatory Visit: Payer: Self-pay | Admitting: *Deleted

## 2018-12-14 DIAGNOSIS — I10 Essential (primary) hypertension: Secondary | ICD-10-CM

## 2018-12-15 MED ORDER — CARVEDILOL 3.125 MG PO TABS: 3.1250 mg | ORAL_TABLET | Freq: Two times a day (BID) | ORAL | 1 refills | Status: DC

## 2018-12-15 NOTE — Telephone Encounter (Signed)
I will provide 2 months of refills. She has not been seen here in over a year. Please call to have [name] come in for a clinic visit before additional refills are prescribed.  [name], MD

## 2018-12-16 MED ORDER — CARVEDILOL 3.125 MG PO TABS: 3.1250 mg | ORAL_TABLET | Freq: Two times a day (BID) | ORAL | 1 refills | Status: DC

## 2018-12-16 NOTE — Addendum Note (Signed)
Addended by: Valerie Roys on: 12/16/2018 08:04 AM   Modules accepted: Orders

## 2018-12-16 NOTE — Telephone Encounter (Signed)
Medication reordered as original one was "printed".  Resent electronically.  Jazmin Hartsell,CMA

## 2018-12-23 ENCOUNTER — Other Ambulatory Visit: Payer: Self-pay

## 2018-12-23 MED ORDER — FLUTICASONE-SALMETEROL 500-50 MCG/DOSE IN AEPB: INHALATION_SPRAY | RESPIRATORY_TRACT | 3 refills | Status: DC

## 2019-01-07 MED ORDER — FLUTICASONE-SALMETEROL 500-50 MCG/DOSE IN AEPB: 1.0000 | INHALATION_SPRAY | Freq: Two times a day (BID) | RESPIRATORY_TRACT | 0 refills | Status: DC

## 2019-01-07 NOTE — Addendum Note (Signed)
Addended by: Dorna Bloom on: 01/07/2019 12:19 PM   Modules accepted: Orders

## 2019-01-12 ENCOUNTER — Other Ambulatory Visit: Payer: Self-pay | Admitting: Internal Medicine

## 2019-01-12 ENCOUNTER — Other Ambulatory Visit: Payer: Self-pay

## 2019-01-12 DIAGNOSIS — M25551 Pain in right hip: Secondary | ICD-10-CM

## 2019-01-12 MED ORDER — FLUTICASONE-SALMETEROL 500-50 MCG/DOSE IN AEPB: INHALATION_SPRAY | RESPIRATORY_TRACT | 3 refills | Status: DC

## 2019-01-12 MED ORDER — MUPIROCIN 2 % EX OINT: 1.0000 "application " | TOPICAL_OINTMENT | Freq: Two times a day (BID) | CUTANEOUS | 3 refills | Status: DC

## 2019-01-12 NOTE — Addendum Note (Signed)
Addended by: Salvatore Marvel on: 01/12/2019 02:09 PM   Modules accepted: Orders

## 2019-01-12 NOTE — Telephone Encounter (Signed)
Request was sent from pharmacy. Medication was approved but it was print. Resend to pharmacy. Salvatore Marvel, CMA

## 2019-01-13 ENCOUNTER — Telehealth: Payer: Self-pay | Admitting: Internal Medicine

## 2019-01-13 ENCOUNTER — Encounter: Payer: Self-pay | Admitting: Pulmonary Disease

## 2019-01-13 ENCOUNTER — Ambulatory Visit (INDEPENDENT_AMBULATORY_CARE_PROVIDER_SITE_OTHER): Payer: Medicare Other | Admitting: Pulmonary Disease

## 2019-01-13 DIAGNOSIS — Z720 Tobacco use: Secondary | ICD-10-CM

## 2019-01-13 DIAGNOSIS — J441 Chronic obstructive pulmonary disease with (acute) exacerbation: Secondary | ICD-10-CM | POA: Diagnosis not present

## 2019-01-13 DIAGNOSIS — J9611 Chronic respiratory failure with hypoxia: Secondary | ICD-10-CM

## 2019-01-13 DIAGNOSIS — J449 Chronic obstructive pulmonary disease, unspecified: Secondary | ICD-10-CM

## 2019-01-13 MED ORDER — PREDNISONE 10 MG PO TABS: ORAL_TABLET | ORAL | 0 refills | Status: DC

## 2019-01-13 MED ORDER — DOXYCYCLINE HYCLATE 100 MG PO TABS: 100.0000 mg | ORAL_TABLET | Freq: Two times a day (BID) | ORAL | 0 refills | Status: DC

## 2019-01-13 NOTE — Telephone Encounter (Signed)
Called and spoke to patient. Patient stated that she tries to call when this happens and get seen by Dr. Annamaria Boots shortly after symptoms begin. Patient stated for the past several days she has had low grade fever around 99.6, congestion in head and chest, productive cough- green, and increase in shortness of breath and wheezing. Offered patient a MyChart video visit but patient reported she doesn't have the ability to do that.  Scheduled patient for telephone visit with NP.  Nothing further needed at this time.

## 2019-01-13 NOTE — Patient Instructions (Addendum)
You were seen today by Coral Ceo, NP  for:   1. COPD exacerbation (HCC)  - doxycycline (VIBRA-TABS) 100 MG tablet; Take 1 tablet (100 mg total) by mouth 2 (two) times daily.  Dispense: 14 tablet; Refill: 0 - predniSONE (DELTASONE) 10 MG tablet; 4 tabs for 2 days, then 3 tabs for 2 days, 2 tabs for 2 days, then 1 tab for 2 days, then stop  Dispense: 20 tablet; Refill: 0  2. Chronic respiratory failure with hypoxia (HCC)  Continue oxygen therapy as prescribed  3. COPD mixed type (HCC)  Continue Advair 500  Incruse Ellipta  >>> Take 1 puff daily in the morning right when you wake up >>>Rinse your mouth out after use >>>This is a daily maintenance inhaler, NOT a rescue inhaler >>>Contact our office if you are having difficulties affording or obtaining this medication >>>It is important for you to be able to take this daily and not miss any doses  Only use your albuterol as a rescue medication to be used if you can't catch your breath by resting or doing a relaxed purse lip breathing pattern.  - The less you use it, the better it will work when you need it. - Ok to use up to 2 puffs  every 4 hours if you must but call for immediate appointment if use goes up over your usual need - Don't leave home without it !!  (think of it like the spare tire for your car)    If symptoms are not improving please contact our office and we will need to consider outpatient Covid testing  4. Tobacco use  Continue to not smoke    We recommend today:  No orders of the defined types were placed in this encounter.  No orders of the defined types were placed in this encounter.  Meds ordered this encounter  Medications  . doxycycline (VIBRA-TABS) 100 MG tablet    Sig: Take 1 tablet (100 mg total) by mouth 2 (two) times daily.    Dispense:  14 tablet    Refill:  0  . predniSONE (DELTASONE) 10 MG tablet    Sig: 4 tabs for 2 days, then 3 tabs for 2 days, 2 tabs for 2 days, then 1 tab for 2 days,  then stop    Dispense:  20 tablet    Refill:  0    Follow Up:    Return in about 2 months (around 03/15/2019), or if symptoms worsen or fail to improve, for Follow up with Dr. Maple Hudson.   Please do your part to reduce the spread of COVID-19:      Reduce your risk of any infection  and COVID19 by using the similar precautions used for avoiding the common cold or flu:  Marland Kitchen Wash your hands often with soap and warm water for at least 20 seconds.  If soap and water are not readily available, use an alcohol-based hand sanitizer with at least 60% alcohol.  . If coughing or sneezing, cover your mouth and nose by coughing or sneezing into the elbow areas of your shirt or coat, into a tissue or into your sleeve (not your hands). Drinda Butts A MASK when in public  . Avoid shaking hands with others and consider head nods or verbal greetings only. . Avoid touching your eyes, nose, or mouth with unwashed hands.  . Avoid close contact with people who are sick. . Avoid places or events with large numbers of people in one location,  like concerts or sporting events. . If you have some symptoms but not all symptoms, continue to monitor at home and seek medical attention if your symptoms worsen. . If you are having a medical emergency, call 911.   [place] / e-Visit: [email or web address]         [facility]: [facility]: [phone]                   [facility]: [phone]     It is flu season:   >>> Best ways to protect herself from the flu: Receive the yearly flu vaccine, practice good hand hygiene washing with soap and also using hand sanitizer when available, eat a nutritious meals, get adequate rest, hydrate appropriately   Please contact the office if your symptoms worsen or you have concerns that you are not improving.   Thank you for  choosing [facility] for your healthcare, and for allowing [place] to partner with you on your healthcare journey. I am thankful to be able to provide care to you today.   Wyn Quaker FNP-C    Chronic Obstructive Pulmonary Disease Exacerbation Chronic obstructive pulmonary disease (COPD) is a long-term (chronic) lung problem. In COPD, the flow of air from the lungs is limited. COPD exacerbations are times that breathing gets worse and you need more than your normal treatment. Without treatment, they can be life threatening. If they happen often, your lungs can become more damaged. If your COPD gets worse, your doctor may treat you with:  Medicines.  Oxygen.  Different ways to clear your airway, such as using a mask. Follow these instructions at home: Medicines  Take over-the-counter and prescription medicines only as told by your doctor.  If you take an antibiotic or steroid medicine, do not stop taking the medicine even if you start to feel better.  Keep up with shots (vaccinations) as told by your doctor. Be sure to get a yearly (annual) flu shot. Lifestyle  Do not smoke. If you need help quitting, ask your doctor.  Eat healthy foods.  Exercise regularly.  Get plenty of sleep.  Avoid tobacco smoke and other things that can bother your lungs.  Wash your hands often with soap and water. This will help keep you from getting an infection. If you cannot use soap and water, use hand sanitizer.  During flu season, avoid areas that are crowded with people. General instructions  Drink enough fluid to keep your pee (urine) clear or pale yellow. Do not do this if your doctor has told you not to.  Use a cool mist machine (vaporizer).  If you use oxygen or a machine that turns medicine into a mist (nebulizer), continue to use it as told.  Follow all instructions for rehabilitation. These are steps you can take to make your body work better.  Keep all follow-up visits as told  by your doctor. This is important. Contact a doctor if:  Your COPD symptoms get worse than normal. Get help right away if:  You are short of breath and it gets worse.  You have trouble talking.  You have chest pain.  You cough up blood.  You have a fever.  You keep throwing up (vomiting).  You feel weak or you pass out (faint).  You feel confused.  You are not able to sleep because of your symptoms.  You are not able to do daily  activities. Summary  COPD exacerbations are times that breathing gets worse and you need more treatment than normal.  COPD exacerbations can be very serious and may cause your lungs to become more damaged.  Do not smoke. If you need help quitting, ask your doctor.  Stay up-to-date on your shots. Get a flu shot every year. This information is not intended to replace advice given to you by your health care provider. Make sure you discuss any questions you have with your health care provider. Document Released: 02/13/2011 Document Revised: 02/06/2017 Document Reviewed: 03/31/2016 Elsevier Patient Education  2020 [employer]

## 2019-01-13 NOTE — Assessment & Plan Note (Signed)
Plan: Continue to not smoke 

## 2019-01-13 NOTE — Assessment & Plan Note (Signed)
Plan: Can continue to use portable oxygen concentrator as previously ordered

## 2019-01-13 NOTE — Assessment & Plan Note (Signed)
Plan: Continue Advair 500 Continue Incruse Ellipta Can use rescue inhaler nebulized meds every 6-8 hours as needed for shortness of breath or wheezing  Patient to have 6 to 8-week follow-up with our office to ensure symptoms are improving and that she is adequately controlled

## 2019-01-13 NOTE — Assessment & Plan Note (Signed)
Plan: °Doxycycline today °Prednisone taper today °

## 2019-01-13 NOTE — Progress Notes (Signed)
Virtual Visit via Telephone Note  I connected with [name] on 01/13/19 at  3:30 PM EST by telephone and verified that I am speaking with the correct person using two identifiers.  Location: Patient: Home Provider: [facility] - [address], [place], [place], [place] [address]   I discussed the limitations, risks, security and privacy concerns of performing an evaluation and management service by telephone and the availability of in person appointments. I also discussed with the patient that there may be a patient responsible charge related to this service. The patient expressed understanding and agreed to proceed.  Patient consented to consult via telephone: Yes People present and their role in pt care: Pt    History of Present Illness:  53 year old female former smoker followed in our office for chronic respiratory failure and Asthma COPD Overlap syndrome   Past medical history: obesity, depression, anxiety, htn, GERD, insomnia, glaucoma Smoking history: Former smoker.  Quit July/2020.  35-pack-year smoking history Maintenance: Advair 500, Incruse Ellipta Patient of Dr. Annamaria Boots  Chief complaint: Wheezing, shortness of breath   53 year old female former smoker completing a televisit with our office.  Patient is reporting that since 01/10/2019 patient has had increased shortness of breath, increased sputum production and sputum color changed to a green color.  Patient has had increased wheezing as well.  She has been using her rescue inhaler or nebulized medication every 6-8 hours due to her shortness of breath.  She continues to be maintained on Advair 500 as well as Incruse Ellipta.  She has not had any recent antibiotic use.  She contacted our office because she feels that she may be having a flare of her COPD.  Patient with known environmental allergies.  Patient also reports that she has significant breathing problems during weather changes and seasonal changes  such as right now.  She does have a low-grade fever of 99.6 today.  Observations/Objective:  01/13/2019 - temp - 99.6  Allergy skin test- significant positives for grass weed and tree pollens, dust mite, cockroach.  Office Spirometry-08/19/2013-severe obstructive airways disease. FVC 2.05/71%, FEV1 1.04/42%, FEV1/FVC 0.51, FEF 25-75%  0.50/17%  Office spirometry 07/06/14-severe obstructive airways disease. FVC 2.37/79%, FEV1 0.92/37%, FEV1/FVC 0.39, FEF 25-75 percent 0.26/9%  Assessment and Plan:  Chronic respiratory failure (HCC) Plan: Can continue to use portable oxygen concentrator as previously ordered  COPD exacerbation Plan: Doxycycline today Prednisone taper today  COPD mixed type ([place]) Plan: Continue Advair 500 Continue Incruse Ellipta Can use rescue inhaler nebulized meds every 6-8 hours as needed for shortness of breath or wheezing  Patient to have 6 to 8-week follow-up with our office to ensure symptoms are improving and that she is adequately controlled  Tobacco use Plan: Continue to not smoke  Follow Up Instructions:  Return in about 2 months (around 03/15/2019), or if symptoms worsen or fail to improve, for Follow up with Dr. Annamaria Boots.   I discussed the assessment and treatment plan with the patient. The patient was provided an opportunity to ask questions and all were answered. The patient agreed with the plan and demonstrated an understanding of the instructions.   The patient was advised to call back or seek an in-person evaluation if the symptoms worsen or if the condition fails to improve as anticipated.  I provided 23 minutes of non-face-to-face time during this encounter.   Lauraine Rinne, NP

## 2019-02-14 ENCOUNTER — Other Ambulatory Visit: Payer: Self-pay | Admitting: *Deleted

## 2019-02-14 ENCOUNTER — Telehealth: Payer: Self-pay | Admitting: Internal Medicine

## 2019-02-14 ENCOUNTER — Other Ambulatory Visit: Payer: Self-pay | Admitting: Internal Medicine

## 2019-02-14 DIAGNOSIS — I10 Essential (primary) hypertension: Secondary | ICD-10-CM

## 2019-02-14 MED ORDER — CARVEDILOL 3.125 MG PO TABS: 3.1250 mg | ORAL_TABLET | Freq: Two times a day (BID) | ORAL | 1 refills | Status: DC

## 2019-02-14 NOTE — Telephone Encounter (Signed)
Dr. Annamaria Boots this was last ordered back in September, are you ok with refill being issued?

## 2019-02-14 NOTE — Telephone Encounter (Signed)
Spoke with patient. She stated that she was told back in August that [place] would place the order for her to have an [employer] device. She was calling to check on the status of this order. I advised her that I reviewed her chart and did not see this order nor was the order form scanned into her chart.   Will call [employer] in the AM since it is after 5pm.

## 2019-02-15 NOTE — Telephone Encounter (Signed)
Chantix refill e-sent 

## 2019-02-16 NOTE — Telephone Encounter (Signed)
I don't see that we have any record of a report of home oxygen saturation record from a home nurse or ay other source since her August televist. Does she have any information on that?

## 2019-02-16 NOTE — Telephone Encounter (Signed)
No POC order ever sent to [employer]  Pt is asking that this be done  She was under the impression that we were going to order this back in August 2020  She states after that last visit with [facility] her home health nurse came to her home and did her o2 qualification at home Please advise thanks!

## 2019-02-17 ENCOUNTER — Other Ambulatory Visit: Payer: Self-pay | Admitting: Family Medicine

## 2019-02-17 DIAGNOSIS — I10 Essential (primary) hypertension: Secondary | ICD-10-CM

## 2019-02-17 NOTE — Telephone Encounter (Signed)
I called and spoke with the patient and she states that she was not coming in on Christmas Eve and she doesn't know where that appointment came from. I advised her that at her last visit with Tyler Aas he advised her to follow up. She states that she can't walk due to her hips and one leg being shorter than the other. I advised her that we would still need her to come into the office to qualify her for the POC for insurance purposes and that Dr. Annamaria Boots is the physician and it has to be documented by his office and not her home health nurse. She states that her oxygen levels drop when she doesn't walk and is off the oxygen, and I explained to her that it still needs to be done here in the office. She states that she will call the office back to make another appointment once she talks with her children.

## 2019-02-17 NOTE — Telephone Encounter (Signed)
We will need the December visit to be in-person, not tele, so we can do the O2 qualifying test here when she comes.

## 2019-02-17 NOTE — Telephone Encounter (Signed)
I called and spoke with the patient and she states that her home health nurse did her qualifying walk on 10/11/18. I advised her that we did not receive this documentation and since it has been so long ago she will have to be qualified again. She states that the only oxygen that she has at home is the large concentrator and she needs a portable. She states that [facility]) her nurse will be coming today between 1 and 2 and she can do her walk again. I advised her that to qualify her the walk needs to be with a POC machine. I explained to her that I would relay this message to Dr. Annamaria Boots. I made her aware that she also has an appointment coming up in 12/24 and her qualification can be done at this visit as well.   Dr. Annamaria Boots please advise.  P.S Nurse phone number is [phone]

## 2019-03-03 ENCOUNTER — Ambulatory Visit: Payer: Medicare Other | Admitting: Internal Medicine

## 2019-03-10 ENCOUNTER — Other Ambulatory Visit: Payer: Self-pay | Admitting: Family Medicine

## 2019-03-10 DIAGNOSIS — I10 Essential (primary) hypertension: Secondary | ICD-10-CM

## 2019-03-14 MED ORDER — FUROSEMIDE 40 MG PO TABS: 40.0000 mg | ORAL_TABLET | Freq: Every day | ORAL | 3 refills | Status: DC | PRN

## 2019-03-17 MED ORDER — FUROSEMIDE 40 MG PO TABS: 40.0000 mg | ORAL_TABLET | Freq: Every day | ORAL | 3 refills | Status: DC | PRN

## 2019-03-17 NOTE — Telephone Encounter (Signed)
Furosemide was originally ordered as phone it, changed order and sent electronically.  Richad Ramsay,CMA

## 2019-03-17 NOTE — Addendum Note (Signed)
Addended by: Henri Medal on: 03/17/2019 04:22 PM   Modules accepted: Orders

## 2019-04-12 ENCOUNTER — Ambulatory Visit: Payer: Medicare Other | Admitting: Internal Medicine

## 2019-05-10 ENCOUNTER — Other Ambulatory Visit: Payer: Self-pay | Admitting: Family Medicine

## 2019-05-10 DIAGNOSIS — I10 Essential (primary) hypertension: Secondary | ICD-10-CM

## 2019-05-23 ENCOUNTER — Other Ambulatory Visit: Payer: Self-pay | Admitting: Internal Medicine

## 2019-05-24 ENCOUNTER — Encounter: Payer: Self-pay | Admitting: Pulmonary Disease

## 2019-05-24 ENCOUNTER — Other Ambulatory Visit: Payer: Self-pay

## 2019-05-24 ENCOUNTER — Ambulatory Visit (INDEPENDENT_AMBULATORY_CARE_PROVIDER_SITE_OTHER): Payer: Medicare Other | Admitting: Pulmonary Disease

## 2019-05-24 DIAGNOSIS — J9611 Chronic respiratory failure with hypoxia: Secondary | ICD-10-CM

## 2019-05-24 DIAGNOSIS — J441 Chronic obstructive pulmonary disease with (acute) exacerbation: Secondary | ICD-10-CM | POA: Diagnosis not present

## 2019-05-24 DIAGNOSIS — J449 Chronic obstructive pulmonary disease, unspecified: Secondary | ICD-10-CM

## 2019-05-24 MED ORDER — DOXYCYCLINE HYCLATE 100 MG PO TABS: 100.0000 mg | ORAL_TABLET | Freq: Two times a day (BID) | ORAL | 0 refills | Status: DC

## 2019-05-24 MED ORDER — PREDNISONE 10 MG PO TABS: ORAL_TABLET | ORAL | 0 refills | Status: DC

## 2019-05-24 NOTE — Assessment & Plan Note (Signed)
Plan: Continue Advair 500 Continue Incruse Ellipta Prednisone taper today as outlined Doxycycline today Continue rescue inhaler nebulized meds every 6-8 hours as needed

## 2019-05-24 NOTE — Patient Instructions (Addendum)
You were seen today by Lauraine Rinne, NP  for:   It was a pleasure talking to you over the phone today.  I am glad that you called our office.  We will treat you today as a COPD exacerbation with antibiotics as well as a course of steroids.  You will also need to have close follow-up with our office in 4 to 6 weeks with Dr. Annamaria Boots.  Continue your Advair and your Incruse as prescribed.  Continue your oxygen therapy.  Please contact our office sooner if symptoms or not improving.  [name]  1. COPD exacerbation (HCC)  - doxycycline (VIBRA-TABS) 100 MG tablet; Take 1 tablet (100 mg total) by mouth 2 (two) times daily.  Dispense: 14 tablet; Refill: 0 - predniSONE (DELTASONE) 10 MG tablet; 4 tabs for 2 days, then 3 tabs for 2 days, 2 tabs for 2 days, then 1 tab for 2 days, then stop  Dispense: 20 tablet; Refill: 0  2. COPD mixed type ([place])  Continue Advair 500 Continue Incruse  Note your daily symptoms > remember "red flags" for COPD:   >>>Increase in cough >>>increase in sputum production >>>increase in shortness of breath or activity  intolerance.   If you notice these symptoms, please call the office to be seen.    3. Chronic respiratory failure with hypoxia (HCC)  Continue oxygen therapy as prescribed  >>>maintain oxygen saturations greater than 88 percent  >>>if unable to maintain oxygen saturations please contact the office  >>>do not smoke with oxygen  >>>can use nasal saline gel or nasal saline rinses to moisturize nose if oxygen causes dryness    We recommend today:   Meds ordered this encounter  Medications  . doxycycline (VIBRA-TABS) 100 MG tablet    Sig: Take 1 tablet (100 mg total) by mouth 2 (two) times daily.    Dispense:  14 tablet    Refill:  0  . predniSONE (DELTASONE) 10 MG tablet    Sig: 4 tabs for 2 days, then 3 tabs for 2 days, 2 tabs for 2 days, then 1 tab for 2 days, then stop    Dispense:  20 tablet    Refill:  0    Follow Up:    Return in about 6  weeks (around 07/05/2019), or if symptoms worsen or fail to improve, for Follow up with Dr. Annamaria Boots.   Please do your part to reduce the spread of COVID-19:      Reduce your risk of any infection  and COVID19 by using the similar precautions used for avoiding the common cold or flu:  Marland Kitchen Wash your hands often with soap and warm water for at least 20 seconds.  If soap and water are not readily available, use an alcohol-based hand sanitizer with at least 60% alcohol.  . If coughing or sneezing, cover your mouth and nose by coughing or sneezing into the elbow areas of your shirt or coat, into a tissue or into your sleeve (not your hands). Langley Gauss A MASK when in public  . Avoid shaking hands with others and consider head nods or verbal greetings only. . Avoid touching your eyes, nose, or mouth with unwashed hands.  . Avoid close contact with people who are sick. . Avoid places or events with large numbers of people in one location, like concerts or sporting events. . If you have some symptoms but not all symptoms, continue to monitor at home and seek medical attention if your symptoms worsen. . If  you are having a medical emergency, call 911.   ADDITIONAL HEALTHCARE OPTIONS FOR PATIENTS   Telehealth / e-Visit: [email or web address]         [facility]: [phone]  [facility]: [phone]                   [facility]: [phone]     It is flu season:   >>> Best ways to protect herself from the flu: Receive the yearly flu vaccine, practice good hand hygiene washing with soap and also using hand sanitizer when available, eat a nutritious meals, get adequate rest, hydrate appropriately   Please contact the office if your symptoms worsen or you have concerns that you are not improving.   Thank you for choosing [facility] for your healthcare, and for allowing [place] to partner with you on  your healthcare journey. I am thankful to be able to provide care to you today.   Elisha Headland FNP-C

## 2019-05-24 NOTE — Assessment & Plan Note (Signed)
Plan: Prednisone taper today Doxycycline today 4 to 6-week follow-up with Dr. Maple Hudson in office

## 2019-05-24 NOTE — Progress Notes (Signed)
Virtual Visit via Telephone Note  I connected with Dyanne Iha on 05/24/19 at  2:30 PM EDT by telephone and verified that I am speaking with the correct person using two identifiers.  Location: Patient: Home Provider: Office [facility] - [address] [place], [address], [place], [place] [address]   I discussed the limitations, risks, security and privacy concerns of performing an evaluation and management service by telephone and the availability of in person appointments. I also discussed with the patient that there may be a patient responsible charge related to this service. The patient expressed understanding and agreed to proceed.  Patient consented to consult via telephone: Yes People present and their role in pt care: Pt     History of Present Illness:  54 year old female former smoker followed in our office for chronic respiratory failure and Asthma COPD Overlap syndrome   Past medical history: obesity, depression, anxiety, htn, GERD, insomnia, glaucoma Smoking history: Former smoker.  Quit July/2020.  35-pack-year smoking history Maintenance: Advair 500, Incruse Ellipta Patient of Dr. Maple Hudson  Chief complaint: Cough  / Wheezing    54 year old female completing a televisit with our office today for acute symptoms of increased cough and wheezing.  Patient last completed a follow-up with our office virtually in November/2020 where she was treated as a COPD exacerbation.  She was encouraged to have follow-up with our office.  She canceled that follow-up with our office due to difficulty with mobility per documentation.  Patient contacted our office today to report 1 day of increased cough, congestion and wheezing.  Patient reporting that she is producing more sputum than usual.  Her sputum is thick, tan, light green.  She continues to be adherent to her Advair 500 as well as Incruse Ellipta.  She also has intermittent wheezing.  Over the phone the patient does have a croupy sounding  cough.  Patient has not had any recent antibiotics or prednisone.  Patient has had no changes to her oxygen needs.  She is still maintained on 2 L 24/7.  Observations/Objective:  Allergy skin test- significant positives for grass weed and tree pollens, dust mite, cockroach.  Office Spirometry-08/19/2013-severe obstructive airways disease. FVC 2.05/71%, FEV1 1.04/42%, FEV1/FVC 0.51, FEF 25-75%  0.50/17%  Office spirometry 07/06/14-severe obstructive airways disease. FVC 2.37/79%, FEV1 0.92/37%, FEV1/FVC 0.39, FEF 25-75 percent 0.26/9%   Social History   Tobacco Use  Smoking Status Former Smoker  . Packs/day: 1.00  . Years: 35.00  . Pack years: 35.00  . Types: Cigarettes  . Quit date: 10/04/2018  . Years since quitting: 0.6  Smokeless Tobacco Never Used  Tobacco Comment   Pt reports taking Chantix 10/11/2018   Immunization History  Administered Date(s) Administered  . Influenza Whole 01/26/2007  . Influenza,inj,Quad PF,6+ Mos 11/26/2012, 12/19/2013, 12/24/2014, 02/13/2018  . Influenza-Unspecified 01/09/2015, 12/04/2015  . Td 04/10/2006  . Tdap 10/01/2017    Assessment and Plan:  COPD exacerbation Plan: Prednisone taper today Doxycycline today 4 to 6-week follow-up with Dr. Maple Hudson in office  Chronic respiratory failure [facility]) Plan: Continue oxygen therapy as prescribed 6-week follow-up with Dr. Maple Hudson  COPD mixed type [facility]) Plan: Continue Advair 500 Continue Incruse Ellipta Prednisone taper today as outlined Doxycycline today Continue rescue inhaler nebulized meds every 6-8 hours as needed   Follow Up Instructions:  Return in about 6 weeks (around 07/05/2019), or if symptoms worsen or fail to improve, for Follow up with Dr. Maple Hudson.   I discussed the assessment and treatment plan with the patient.  The patient was provided an opportunity to ask questions and all were answered. The patient agreed with the plan and demonstrated an understanding of the instructions.    The patient was advised to call back or seek an in-person evaluation if the symptoms worsen or if the condition fails to improve as anticipated.  I provided 22 minutes of non-face-to-face time during this encounter.   Lauraine Rinne, NP

## 2019-05-24 NOTE — Assessment & Plan Note (Signed)
Plan: Continue oxygen therapy as prescribed 6-week follow-up with Dr. Maple Hudson

## 2019-05-25 ENCOUNTER — Telehealth: Payer: Self-pay | Admitting: Internal Medicine

## 2019-05-25 NOTE — Telephone Encounter (Signed)
Patient had a televisit with Arlys John yesterday. He has requested that she has a follow up with CY in April the week of the 26th. Called patient but she did not answer. Left detailed message for her to call back.

## 2019-05-26 ENCOUNTER — Other Ambulatory Visit: Payer: Self-pay | Admitting: Internal Medicine

## 2019-06-16 ENCOUNTER — Other Ambulatory Visit: Payer: Self-pay | Admitting: Internal Medicine

## 2019-06-16 DIAGNOSIS — J441 Chronic obstructive pulmonary disease with (acute) exacerbation: Secondary | ICD-10-CM

## 2019-06-17 ENCOUNTER — Other Ambulatory Visit: Payer: Self-pay | Admitting: Internal Medicine

## 2019-06-17 ENCOUNTER — Other Ambulatory Visit: Payer: Self-pay

## 2019-06-17 ENCOUNTER — Other Ambulatory Visit: Payer: Self-pay | Admitting: Family Medicine

## 2019-06-17 DIAGNOSIS — M25551 Pain in right hip: Secondary | ICD-10-CM

## 2019-06-17 NOTE — Telephone Encounter (Signed)
Patient requesting a refill on chantix . Patient stated she started smoking again.last office visit was 05/25/19.Can you please advise.

## 2019-06-18 NOTE — Telephone Encounter (Signed)
Chantix refill e-sent 

## 2019-06-20 ENCOUNTER — Other Ambulatory Visit: Payer: Self-pay | Admitting: Family Medicine

## 2019-06-20 ENCOUNTER — Other Ambulatory Visit: Payer: Self-pay | Admitting: *Deleted

## 2019-06-20 ENCOUNTER — Telehealth: Payer: Self-pay | Admitting: Internal Medicine

## 2019-06-20 DIAGNOSIS — I10 Essential (primary) hypertension: Secondary | ICD-10-CM

## 2019-06-20 MED ORDER — DOXYCYCLINE HYCLATE 100 MG PO TABS: ORAL_TABLET | ORAL | 0 refills | Status: DC

## 2019-06-20 MED ORDER — BENZONATATE 200 MG PO CAPS: 200.0000 mg | ORAL_CAPSULE | Freq: Four times a day (QID) | ORAL | 0 refills | Status: DC | PRN

## 2019-06-20 MED ORDER — CARVEDILOL 3.125 MG PO TABS: ORAL_TABLET | ORAL | 0 refills | Status: DC

## 2019-06-20 NOTE — Telephone Encounter (Signed)
Spoke with the pt  She is c/o cough and head/chest congestion for the past 3-4 days  She is coughing up green to yellow sputum  She also c/o increased SOB and wheezing- using neb 4-5 x per day  She states no f/c/s  She is still on her incruse, singulair, claritin  Would like something called in  Please advise, thanks  Allergies  Allergen Reactions  . Cephalexin Itching and Rash  . Acetaminophen Nausea Only  . Asa [Aspirin]   . Hydrocodone-Acetaminophen     REACTION: itching  . Morphine And Related   . Sulfamethoxazole-Trimethoprim Rash   Current Outpatient Medications on File Prior to Visit  Medication Sig Dispense Refill  . albuterol (PROVENTIL) (2.5 MG/3ML) 0.083% nebulizer solution USE 1 VIAL IN NEBULIZER EVERY 4 HOURS AS NEEDED 75 vial prn  . albuterol (VENTOLIN HFA) 108 (90 Base) MCG/ACT inhaler INHALE 2 PUFFS INTO THE LUNGS EVERY 6 HOURS AS NEEDED FOR WHEEZING OR SHORTNESS OF BREATH. 18 g 4  . ALPRAZolam (XANAX) 0.5 MG tablet Take 1 tablet by mouth 2 (two) times daily as needed.     Marland Kitchen amLODipine (NORVASC) 10 MG tablet Take 1 tablet (10 mg total) by mouth at bedtime. 90 tablet 3  . carvedilol (COREG) 3.125 MG tablet TAKE 1 TABLET BY MOUTH 2 TIMES DAILY WITH A MEAL. 60 tablet 0  . CHANTIX CONTINUING MONTH PAK 1 MG tablet TAKE 1 TABLET TWICE DAILY 56 tablet 2  . doxycycline (VIBRA-TABS) 100 MG tablet Take 1 tablet (100 mg total) by mouth 2 (two) times daily. 14 tablet 0  . EPINEPHrine (EPI-PEN) 0.3 mg/0.3 mL DEVI Inject 0.3 mLs (0.3 mg total) into the muscle once. 1 Device prn  . ferrous sulfate 325 (65 FE) MG EC tablet Take 1 tablet by mouth 2 (two) times daily.    . fluticasone (FLONASE) 50 MCG/ACT nasal spray INSTILL 1 SPRAY INTO EACH NOSTRIL TWICE DAILY 16 g 2  . furosemide (LASIX) 40 MG tablet Take 1 tablet (40 mg total) by mouth daily as needed. 30 tablet 3  . gabapentin (NEURONTIN) 300 MG capsule Take 1 capsule by mouth at bedtime.    Marland Kitchen ibuprofen (ADVIL,MOTRIN) 800 MG  tablet Take 1 tablet (800 mg total) by mouth every 8 (eight) hours as needed. 30 tablet 0  . INCRUSE ELLIPTA 62.5 MCG/INH AEPB INHALE 1 PUFF INTO THE LUNGS DAILY. 30 each 4  . loratadine (CLARITIN) 10 MG tablet Take 1 tablet (10 mg total) by mouth daily. 30 tablet 11  . montelukast (SINGULAIR) 10 MG tablet TAKE 1 TABLET BY MOUTH AT BEDTIME. 30 tablet 6  . mupirocin ointment (BACTROBAN) 2 % PLACE 1 APPLICATION INTO THE NOSE 2 TIMES DAILY. 22 g 2  . omeprazole (PRILOSEC) 20 MG capsule TAKE 1 CAPSULE ONCE A DAY. 90 capsule 3  . oxyCODONE (OXYCONTIN) 20 mg 12 hr tablet Take 10 mg by mouth.     . OXYGEN Place 2 L/min into the nose as directed. Oxygen via [place] @@ 2L/min on exertion with stationary concentrator and portable gas tank.    . TRAVATAN Z 0.004 % SOLN ophthalmic solution Place 1 drop into both eyes at bedtime.     No current facility-administered medications on file prior to visit.

## 2019-06-20 NOTE — Telephone Encounter (Signed)
Please call and tell [name] that she needs to be seen in clinic before additional refills are sent in.

## 2019-06-20 NOTE — Telephone Encounter (Signed)
Spoke with pt and advised of Dr Roxy Cedar recommendations. Rx sent to pharmacy. Pt verbalized understanding. Nothing further needed at this time.

## 2019-06-20 NOTE — Telephone Encounter (Signed)
Suggest we offer Doxycycline 100 mg, # 8, 2 today then one daily Tessalon perles 200 mg, # 30, 1 every 6 hours if needed  Consider antihistamine if pollen is bothering

## 2019-06-21 MED ORDER — CARVEDILOL 3.125 MG PO TABS: ORAL_TABLET | ORAL | 0 refills | Status: DC

## 2019-06-21 NOTE — Addendum Note (Signed)
Addended by: Henri Medal on: 06/21/2019 08:48 AM   Modules accepted: Orders

## 2019-06-21 NOTE — Telephone Encounter (Signed)
Patient informed that she will need an appointment before we can refill medication again.  I did resend it to pharmacy as the original was ordered to be printed.  Patient voiced understanding but states that she will call back to schedule.  Jazmin Hartsell,CMA

## 2019-06-28 ENCOUNTER — Other Ambulatory Visit: Payer: Self-pay | Admitting: Internal Medicine

## 2019-07-19 ENCOUNTER — Other Ambulatory Visit: Payer: Self-pay | Admitting: Family Medicine

## 2019-07-19 DIAGNOSIS — I10 Essential (primary) hypertension: Secondary | ICD-10-CM

## 2019-07-21 MED ORDER — AMLODIPINE BESYLATE 10 MG PO TABS: 10.0000 mg | ORAL_TABLET | Freq: Every day | ORAL | 3 refills | Status: DC

## 2019-07-21 NOTE — Addendum Note (Signed)
Addended by: Henri Medal on: 07/21/2019 01:42 PM   Modules accepted: Orders

## 2019-08-09 ENCOUNTER — Other Ambulatory Visit: Payer: Self-pay | Admitting: Family Medicine

## 2019-08-19 ENCOUNTER — Other Ambulatory Visit: Payer: Self-pay | Admitting: Family Medicine

## 2019-08-19 DIAGNOSIS — I10 Essential (primary) hypertension: Secondary | ICD-10-CM

## 2019-09-06 ENCOUNTER — Telehealth: Payer: Self-pay | Admitting: Internal Medicine

## 2019-09-06 MED ORDER — PREDNISONE 10 MG PO TABS: ORAL_TABLET | ORAL | 0 refills | Status: DC

## 2019-09-06 MED ORDER — AZITHROMYCIN 250 MG PO TABS: ORAL_TABLET | ORAL | 0 refills | Status: DC

## 2019-09-06 NOTE — Telephone Encounter (Signed)
Sent in zpack and prednisone 20mg  x 5 days. Continue Mucinex twice daily and all prescribed inhalers. Follow-up with if not better after completed or if symptoms worsen

## 2019-09-06 NOTE — Telephone Encounter (Signed)
Spoke with pt. States that she feels like she is having a COPD flare up. Reports increased shortness of breath, coughing, wheezing and chest congestion. Cough is producing green mucus. Denies chest tightness, fever/chills or sick contacts. Symptoms started yesterday and have progressively gotten worse. We do have any available appointments at this time.  Beth - please advise. Thanks.

## 2019-09-06 NOTE — Telephone Encounter (Signed)
Spoke with pt. She is aware of Beth's recommendations. Rxs were sent in by [facility]. Nothing further was needed.

## 2019-09-06 NOTE — Telephone Encounter (Signed)
Patient calling with wheezing, cough with green mucus, green mucus from nares, congestion, SOB, and "rattle in chest". She is taking cough syrup and mucinex. Symptoms started yesterday. She is requesting antibiotics. History COPD. Please advise.

## 2019-09-29 ENCOUNTER — Other Ambulatory Visit: Payer: Self-pay | Admitting: Family Medicine

## 2019-09-29 ENCOUNTER — Other Ambulatory Visit: Payer: Self-pay | Admitting: Internal Medicine

## 2019-09-29 DIAGNOSIS — I10 Essential (primary) hypertension: Secondary | ICD-10-CM

## 2019-09-29 DIAGNOSIS — M25551 Pain in right hip: Secondary | ICD-10-CM

## 2019-09-30 NOTE — Telephone Encounter (Signed)
chantix refill e-sent

## 2019-09-30 NOTE — Telephone Encounter (Signed)
Pt is requesting Chantix refill.   Dr. Maple Hudson please advise.

## 2019-10-03 ENCOUNTER — Telehealth: Payer: Self-pay | Admitting: Internal Medicine

## 2019-10-03 NOTE — Telephone Encounter (Signed)
Add to my schedule for this afternoon for a tele visit.  They have not been vaccinated that I can see.  Thanks

## 2019-10-03 NOTE — Telephone Encounter (Signed)
Called and spoke with patient who states she started feeling bad over the weekend. She has a productive cough with green sputum. She is also wheezing and states that her O2 her been going anywhere from 95-82 and she wears 2 liters of oxygen all the time. She states she has been doing her Nebs 4 times a day as well as using her inhalers with no relief.    [name] please advise

## 2019-10-05 ENCOUNTER — Other Ambulatory Visit: Payer: Self-pay

## 2019-10-05 ENCOUNTER — Ambulatory Visit (INDEPENDENT_AMBULATORY_CARE_PROVIDER_SITE_OTHER): Payer: Medicare Other | Admitting: Acute Care

## 2019-10-05 ENCOUNTER — Encounter: Payer: Self-pay | Admitting: Acute Care

## 2019-10-05 DIAGNOSIS — J441 Chronic obstructive pulmonary disease with (acute) exacerbation: Secondary | ICD-10-CM | POA: Diagnosis not present

## 2019-10-05 DIAGNOSIS — J9620 Acute and chronic respiratory failure, unspecified whether with hypoxia or hypercapnia: Secondary | ICD-10-CM | POA: Diagnosis not present

## 2019-10-05 NOTE — Patient Instructions (Addendum)
Please go to the [facility] ED for a CXR and Care You will need a CXR to RO pneumonia, and COVID testing.  Advised to call  911 to seek emergency care Advised to titrate oxygen up to maintain sats at 88-92% Advised to use her nebs as needed for shortness of breath or wheezing Advised to stop smoking Follow up after ED visit with Dr. Maple Hudson or NP Please contact office for sooner follow up if symptoms do not improve or worsen or seek emergency care

## 2019-10-05 NOTE — Progress Notes (Signed)
Virtual Visit via Telephone Note  I connected with [name] on 10/05/19 at  3:30 PM EDT by telephone and verified that I am speaking with the correct person using two identifiers.  Location: Patient: At home Provider: [address], [place], [place], [address]   I discussed the limitations, risks, security and privacy concerns of performing an evaluation and management service by telephone and the availability of in person appointments. I also discussed with the patient that there may be a patient responsible charge related to this service. The patient expressed understanding and agreed to proceed.  Synopsis 54 year old female former smoker followed in our office for chronic respiratory failure and Asthma COPD Overlap syndrome   Past medical history: obesity, depression, anxiety, htn, GERD, insomnia, glaucoma Smoking history: Former smoker.  Quit July/2020.  35-pack-year smoking history Maintenance: Advair 500, Incruse Ellipta Patient of Dr. Maple Hudson  Chief Complaint: Night sweats, Cough, Green mucus, Sats dropping into the 70's with exertion Last ABC pred taper 08/2019   History of Present Illness: Pt. Has not felt well since Saturday 10/01/2019. She is coughing up green secretions since Monday 10/03/2019. She was offered a OV 7/26 in the office when she called in at that time. She denies fever, but has chills. She is waking up at night sweating and then she is cold. Pt.is dyspneic with any activity. She currently wears 2 L NV. Her saturations have been as low as 78% with exertion and cough. She continues to smoke about 1 pack per week. She is currently on Chantix. Currently using over the counter Mucinex every 12 hours, and generic Nyquil every 6 hours. She denies any sick exposures. She has not had her Covid vaccine. She has increased her oxygen to 2.5L. She has a very productive cough. Her voice is very raspy and hoarse.   Doxy 05/2019/ 06/2019 Arlys John and Dr. Maple Hudson) Prednisone   taper and z pack  08/2019 Pt has not had covid vaccinations  Observations/Objective: Pt. Is hoarse, coughing and audibly wheezing. She states she is wearing oxygen  At 2.5 L . Her sat at rest is 91%.  Allergy skin test- significant positives for grass weed and tree pollens, dust mite, cockroach.  Office Spirometry-08/19/2013-severe obstructive airways disease. FVC 2.05/71%, FEV1 1.04/42%, FEV1/FVC 0.51, FEF 25-75% 0.50/17%  Office spirometry 07/06/14-severe obstructive airways disease. FVC 2.37/79%, FEV1 0.92/37%, FEV1/FVC 0.39, FEF 25-75 percent 0.26/9%    Assessment and Plan: Acute on Chronic Respiratory Failure Fever with worsening respiratory symptoms vs pneumonia Vs COPD flare vs Covid 19 ( Pt. Is not vaccinated) Desaturations into the 70's with minimal exertion Thick green secretions with productive cough Continues to smoke Plan Advised to go to the [facility] ED for care. She will need a CXR to RO pneumonia, and COVID testing.  Advised to call  911 to seek emergency care Advised to titrate oxygen up to maintain sats at 88-92% Advised to use her nebs as needed for shortness of breath or wheezing Advised to stop smoking  Follow Up Instructions: Advised to go to the ED for CXR to rule out pneumonia, Covid Testing and evaluation of hypoxemia.   I discussed the assessment and treatment plan with the patient. The patient was provided an opportunity to ask questions and all were answered. The patient agreed with the plan and demonstrated an understanding of the instructions.   The patient was advised to call back or seek an in-person evaluation if the symptoms worsen or if the condition fails to improve as  anticipated.  I provided 35 minutes of non-face-to-face time during this encounter.   Bevelyn Ngo, NP 10/05/2019 3:59 PM

## 2019-10-18 ENCOUNTER — Other Ambulatory Visit: Payer: Self-pay | Admitting: Family Medicine

## 2019-11-17 ENCOUNTER — Other Ambulatory Visit: Payer: Self-pay | Admitting: Family Medicine

## 2019-11-17 ENCOUNTER — Other Ambulatory Visit: Payer: Self-pay | Admitting: Internal Medicine

## 2019-11-17 DIAGNOSIS — M25551 Pain in right hip: Secondary | ICD-10-CM

## 2019-11-17 DIAGNOSIS — I10 Essential (primary) hypertension: Secondary | ICD-10-CM

## 2019-11-17 DIAGNOSIS — K219 Gastro-esophageal reflux disease without esophagitis: Secondary | ICD-10-CM

## 2019-11-18 NOTE — Telephone Encounter (Signed)
[name] has not been seen in our clinic in about two years. I have given her 2 months of refills. Please call and have her schedule an appointment before additional refills are prescribed.  Mirian Mo, MD

## 2019-11-18 NOTE — Telephone Encounter (Signed)
LM for patient to call back. Aubri Gathright,CMA  

## 2019-12-02 ENCOUNTER — Telehealth: Payer: Self-pay | Admitting: Internal Medicine

## 2019-12-02 MED ORDER — AZITHROMYCIN 250 MG PO TABS: ORAL_TABLET | ORAL | 0 refills | Status: DC

## 2019-12-02 MED ORDER — PREDNISONE 10 MG PO TABS: ORAL_TABLET | ORAL | 0 refills | Status: DC

## 2019-12-02 NOTE — Telephone Encounter (Signed)
See my suggestion for zpak and pred below- had to reboot Epic.

## 2019-12-02 NOTE — Telephone Encounter (Signed)
Primary Pulmonologist: Dr.Young Last office visit and with whom: 10/05/19 [name] What do we see them for (pulmonary problems): COPD Last OV assessment/plan:  Assessment and Plan: Acute on Chronic Respiratory Failure Fever with worsening respiratory symptoms vs pneumonia Vs COPD flare vs Covid 19 ( Pt. Is not vaccinated) Desaturations into the 70's with minimal exertion Thick green secretions with productive cough Continues to smoke Plan Advised to go to the [facility] ED for care. She will need a CXR to RO pneumonia, and COVID testing.  Advised to call  911 to seek emergency care Advised to titrate oxygen up to maintain sats at 88-92% Advised to use her nebs as needed for shortness of breath or wheezing Advised to stop smoking  Follow Up Instructions: Advised to go to the ED for CXR to rule out pneumonia, Covid Testing and evaluation of hypoxemia.  I discussed the assessment and treatment plan with the patient. The patient was provided an opportunity to ask questions and all were answered. The patient agreed with the plan and demonstrated an understanding of the instructions.  The patient was advised to call back or seek an in-person evaluation if the symptoms worsen or if the condition fails to improve as anticipated.  I provided 35 minutes of non-face-to-face time during this encounter.   Bevelyn Ngo, NP 10/05/2019 3:59 PM     Patient Instructions by Bevelyn Ngo, NP at 10/05/2019 3:30 PM Author: Bevelyn Ngo, NP Author Type: Nurse Practitioner Filed: 10/05/2019 3:58 PM  Note Status: Addendum Cosign: Cosign Not Required Encounter Date: 10/05/2019  Editor: Bevelyn Ngo, NP (Nurse Practitioner)      Prior Versions: 1. Bevelyn Ngo, NP (Nurse Practitioner) at 10/05/2019 3:57 PM - Signed    Please go to the [facility] ED for a CXR and Care You will need a CXR to RO pneumonia, and COVID testing.  Advised to call  911 to seek emergency care Advised to titrate oxygen up  to maintain sats at 88-92% Advised to use her nebs as needed for shortness of breath or wheezing Advised to stop smoking Follow up after ED visit with Dr. Maple Hudson or NP Please contact office for sooner follow up if symptoms do not improve or worsen or seek emergency care      Instructions    Return in about 2 weeks (around 10/19/2019), or if symptoms worsen or fail to improve. Please go to the [facility] ED for a CXR and Care You will need a CXR to RO pneumonia, and COVID testing.  Advised to call  911 to seek emergency care Advised to titrate oxygen up to maintain sats at 88-92% Advised to use her nebs as needed for shortness of breath or wheezing Advised to stop smoking Follow up after ED visit with Dr. Maple Hudson or NP Please contact office for sooner follow up if symptoms do not improve or worsen or seek emergency care          After Visit Summary (Printed 10/05/2019) Communications    [facility] Provider CC Chart Rep sent to Mirian Mo, MD   Chart Routed to Waymon Budge, MD Communication Routing History  Recipient Method Sent by Date Sent  Mirian Mo, MD In Leona Carry, NP 10/05/2019     No questionnaires available.           Orders Placed   None Medication Changes      (Completed Course)  Patient not taking:  Reported on 10/05/2019     (Completed  Course)  Patient not taking:  Reported on 10/05/2019   Doxycycline Hyclate      (Completed Course)  Patient not taking:  Reported on 10/05/2019    (Completed Course)  Patient not taking:  Reported on 10/05/2019     (Completed Course)  Patient not taking:  Reported on 10/05/2019     (Completed Course)     (Completed Course)  Patient not taking:  Reported on 10/05/2019     (Completed Course)  Patient not taking:  Reported on 10/05/2019   Medication List     Was appointment offered to patient (explain)?     Reason for call:   Symptoms started 9/21/21runny nose, fever, wheezing, and cough,mucus  yellowish-green   Dr.Young can you please advise.  Thank you   Allergies  Allergen Reactions  . Cephalexin Itching and Rash  . Acetaminophen Nausea Only  . Asa [Aspirin]   . Hydrocodone-Acetaminophen     REACTION: itching  . Morphine And Related   . Sulfamethoxazole-Trimethoprim Rash    Immunization History  Administered Date(s) Administered  . Influenza Whole 01/26/2007  . Influenza,inj,Quad PF,6+ Mos 11/26/2012, 12/19/2013, 12/24/2014, 02/13/2018, 12/06/2018  . Influenza-Unspecified 01/09/2015, 12/04/2015  . Td 04/10/2006  . Tdap 10/01/2017

## 2019-12-02 NOTE — Telephone Encounter (Signed)
Offer Zpak  250 mg, # 6, 2 today then one daily              Prednisone 10 mg, # 20, 4 X 2 DAYS, 3 X 2 DAYS, 2 X 2 DAYS, 1 X 2 DAYS  

## 2019-12-02 NOTE — Telephone Encounter (Signed)
Spoke with patient regarding prior message.Advised patient that Dr.Young ok to send in script's to her Zpak 250 mg, # 6, 2 today then one daily          Prednisone 10 mg, # 20, 4 X 2 DAYS, 3 X 2 DAYS, 2 X 2 DAYS, 1 X 2 DAY.  Patient is aware and voice is understanding. Nothing else further needed.

## 2019-12-09 ENCOUNTER — Telehealth: Payer: Self-pay | Admitting: Internal Medicine

## 2019-12-09 MED ORDER — DOXYCYCLINE HYCLATE 100 MG PO TABS: 100.0000 mg | ORAL_TABLET | Freq: Two times a day (BID) | ORAL | 0 refills | Status: DC

## 2019-12-09 NOTE — Telephone Encounter (Signed)
Called and spoke with patient who states that she finished her antibiotic on Tuesday and she is only feeling a little better but feels like the Pneumonia is not gone. She has a productive cough with green sputum still. She states she has finished the prednisone. Denies fever  Dr. Maple Hudson please advise

## 2019-12-09 NOTE — Telephone Encounter (Signed)
Called and spoke with pt letting her know that we were sending Rx for doxy to pharmacy for her and she verbalized understanding. Verified preferred pharmacy and sent Rx in. Nothing further needed.

## 2019-12-09 NOTE — Telephone Encounter (Signed)
Please offer doxycycline 100 mg, # 14, 1 twice daily. Don't repeat prednisone now.

## 2019-12-12 ENCOUNTER — Other Ambulatory Visit: Payer: Self-pay | Admitting: Internal Medicine

## 2019-12-16 ENCOUNTER — Other Ambulatory Visit: Payer: Self-pay | Admitting: Family Medicine

## 2019-12-16 DIAGNOSIS — I10 Essential (primary) hypertension: Secondary | ICD-10-CM

## 2019-12-19 NOTE — Telephone Encounter (Signed)
I have provided a 1 month supply of medication. Please ask Ms. Havlicek to be seen in clinic before additional refills are given.  Mirian Mo, MD

## 2019-12-20 NOTE — Telephone Encounter (Signed)
Lm for patient asking her to call office to schedule an appointment with pcp for medication refills.  Kelsha Older,CMA

## 2020-01-18 ENCOUNTER — Other Ambulatory Visit: Payer: Self-pay | Admitting: Family Medicine

## 2020-01-18 ENCOUNTER — Other Ambulatory Visit: Payer: Self-pay | Admitting: Internal Medicine

## 2020-01-18 DIAGNOSIS — K219 Gastro-esophageal reflux disease without esophagitis: Secondary | ICD-10-CM

## 2020-01-18 DIAGNOSIS — J441 Chronic obstructive pulmonary disease with (acute) exacerbation: Secondary | ICD-10-CM

## 2020-01-18 NOTE — Telephone Encounter (Signed)
Pt is requesting refill on MONTELUKAST 10MG  next ov 01/23/20

## 2020-01-18 NOTE — Telephone Encounter (Signed)
Singulair refilled.

## 2020-01-22 NOTE — Progress Notes (Deleted)
HPI female one pack per day smoker followed for COPD mixed type, Chronic Respiratory Failure Hypoxic,, lung nodules, seasonal allergic rhinitis, tobacco abuse, complicated by hip surgery/infection/repair, anxiety/depression, GERD, HBP, obesity, Husband is a patient here ( blind and on dialysis)  Allergy skin test- significant positives for grass weed and tree pollens, dust mite, cockroach. Office Spirometry-08/19/2013-severe obstructive airways disease. FVC 2.05/71%, FEV1 1.04/42%, FEV1/FVC 0.51, FEF 25-75%  0.50/17% Office spirometry 07/06/14-severe obstructive airways disease. FVC 2.37/79%, FEV1 0.92/37%, FEV1/FVC 0.39, FEF 25-75 percent 0.26/9%  -----------------------------------------------------------------------------------------   10/11/2018- Virtual Visit via Telephone Note   History of Present Illness: 54 year old female one pack per day smoker followed for COPD mixed type, lung nodules, Chronic resp failure hypoxic, seasonal allergic rhinitis, tobacco abuse, complicated by hip surgery/infection/repair, anxiety/depression, GERD, HBP, obesity, O2 2 L continuous  Incruse, Singulair, Claritin,  Advair 500, Chantix, albuterol hfa, neb albuterol,  -----(see telephone note 07/31) pt states she is doing better since augmentin, OTC cough medication, and breathing treatments; reports cough w/ white mucus Says quit smoking "1 weeks ago"- strongly supported in our discussion. Latest hip surgery 02/10/18- repeated complications. She doesn't intend to allow any more hips surgery. Has Home Health RN. We will ask her to get O2 sat at rest on room air to qualify for O2.  Home oximetry usually 95-98% on O2 2L.  Needs new O2 supplier. Wants Inogen 1 machine at 2L.  Hosp this Spring with pneumonia. Sputum now white as she finishes course of augmentin we called in 7/31.  Observations/Objective:   Assessment and Plan: Chronic resp failure hypoxic- HHRN to get O2 sat rest room air to qualify for Inogen  POC COPD mixed type- chronic smoker. Meds sufficient if she can stay off cigs.  Tobacco- supporting cessation Follow Up Instructions: 6 months   01/23/20- 54 year old female one pack per day smoker followed for COPD mixed type, lung nodules, Chronic resp failure hypoxic, seasonal allergic rhinitis, tobacco abuse, complicated by hip surgery/infection/repair, anxiety/depression, GERD, HBP, obesity, O2 2 L continuous  Incruse, Singulair, Claritin,  Advair 500, Chantix, albuterol hfa, neb albuterol,  Covid vax- Flu vax-   ROS-see HPI     + = positive Constitutional:   No-   weight loss, night sweats, fevers, chills, fatigue, lassitude. HEENT:   No-  headaches, difficulty swallowing, tooth/dental problems, no-sore throat,       No- sneezing, no-itching, ear ache, +nasal congestion,+ post nasal drip,  CV:  No-   chest pain, orthopnea, PND,+ swelling in lower extremities, no-anasarca,                                                       dizziness, palpitations Resp: + shortness of breath with exertion or at rest.             + productive cough, + non-productive cough,  No- coughing up of blood.              No-   change in color of mucus.  No- wheezing.   Skin: No-   rash or lesions. GI:  + heartburn, indigestion, No-abdominal pain, nausea, vomiting,  GU:  MS:  + joint pain or swelling.   Neuro-     nothing unusual Psych:  No- change in mood or affect. + depression or anxiety.  No memory loss.  OBJ- Physical Exam  General- Alert, Oriented, Affect-appropriate, Distress- none acute. + Overweight, + talkative + wheelchair,                                     +O2 Skin- rash-none, lesions- none, excoriation- none Lymphadenopathy- none Head- atraumatic            Eyes- Gross vision intact, PERRLA, conjunctivae and secretions clear            Ears- Hearing, canals-normal            Nose- + turbinate edema, no-Septal dev, +mucus bridging, polyps, erosion, perforation             Throat-  Mallampati III , mucosa clear , drainage- none, tonsils- atrophic, + dentures,  Neck- flexible , trachea midline, no stridor , thyroid nl, carotid no bruit Chest - symmetrical excursion , unlabored           Heart/CV- RRR , no murmur , no gallop  , no rub, nl s1 s2                           - JVD- none , edema- none, stasis changes- none, varices- none           Lung-  wheeze + coarse/unlabored, cough+ upper airway rattle,  dullness-none, rub- none           Chest wall-  Abd-  Br/ Gen/ Rectal- Not done, not indicated Extrem- cyanosis- none, clubbing, none, atrophy- none, strength- nl, + wheelchair Neuro- grossly intact to observation

## 2020-01-23 ENCOUNTER — Ambulatory Visit: Payer: Medicare Other | Admitting: Internal Medicine

## 2020-02-16 ENCOUNTER — Telehealth: Payer: Self-pay | Admitting: Internal Medicine

## 2020-02-16 NOTE — Telephone Encounter (Signed)
Please send Zpak 250 mg, # 6, 2 today then one daily-  -thanks

## 2020-02-16 NOTE — Telephone Encounter (Signed)
Patient reports shortness of breath, wheezing, cough with green mucus and nasal congestion x 3 days. She has been taking Dayquil and Nyquil. Compliant with Incruse and Advair. She has been using Albuterol nebulizer every 4 hours.  She tested negative for covid 2 days ago. Her O2 level was 93% 2L. Audible wheezing on the phone. Tammy has televisit slots available on Monday. Would you be willing to send something in for her in the mean time?

## 2020-02-17 MED ORDER — AZITHROMYCIN 250 MG PO TABS: ORAL_TABLET | ORAL | 0 refills | Status: DC

## 2020-02-17 NOTE — Telephone Encounter (Signed)
Called and spoke with Patient.  Patient aware zpak has been sent to Pharmacy.  Patient scheduled televisit with Tammy, NP 02/20/20 at 0930.  Nothing further at this time.

## 2020-02-17 NOTE — Telephone Encounter (Signed)
Sent, please let patient know I sent in Zpack per Dr. Maple Hudson. Please schedule her a televisit on Monday with TP.

## 2020-02-20 ENCOUNTER — Ambulatory Visit (INDEPENDENT_AMBULATORY_CARE_PROVIDER_SITE_OTHER): Payer: Medicare Other | Admitting: Adult Health

## 2020-02-20 ENCOUNTER — Encounter: Payer: Self-pay | Admitting: Adult Health

## 2020-02-20 ENCOUNTER — Other Ambulatory Visit: Payer: Self-pay

## 2020-02-20 DIAGNOSIS — J441 Chronic obstructive pulmonary disease with (acute) exacerbation: Secondary | ICD-10-CM | POA: Diagnosis not present

## 2020-02-20 DIAGNOSIS — J9611 Chronic respiratory failure with hypoxia: Secondary | ICD-10-CM

## 2020-02-20 DIAGNOSIS — Z720 Tobacco use: Secondary | ICD-10-CM

## 2020-02-20 MED ORDER — PREDNISONE 10 MG PO TABS: ORAL_TABLET | ORAL | 0 refills | Status: DC

## 2020-02-20 MED ORDER — FLUTICASONE-SALMETEROL 500-50 MCG/DOSE IN AEPB
INHALATION_SPRAY | RESPIRATORY_TRACT | 5 refills | Status: DC
Start: 2020-02-20 — End: 2020-08-14

## 2020-02-20 NOTE — Progress Notes (Signed)
Virtual Visit via Telephone Note  I connected with [name] on 02/20/20 at  9:30 AM EST by telephone and verified that I am speaking with the correct person using two identifiers.  Location: Patient: Home  Provider: Home Office    I discussed the limitations, risks, security and privacy concerns of performing an evaluation and management service by telephone and the availability of in person appointments. I also discussed with the patient that there may be a patient responsible charge related to this service. The patient expressed understanding and agreed to proceed.   History of Present Illness: Okay family 54 year old female active smoker followed for COPD with asthma and chronic respiratory failure on home oxygen  Today's telemedicine visit is a follow-up for COPD exacerbation.  She complains about 1 week ago she started having increased cough nasal congestion postnasal drainage, thick green mucus.  Patient says she took a Covid test that was negative.  She was called in a Z-Pak and is on day 4 of 5.  She is starting to feel better but continues to have a productive cough.  Mucus is starting to clear up.  She still has some intermittent wheezing.  She denies any hemoptysis chest pain orthopnea PND or increased leg swelling. Remains on Advair and Incruse .   Past Medical History:  Diagnosis Date  . Anxiety   . COPD (chronic obstructive pulmonary disease) (HCC)   . Depression   . GERD (gastroesophageal reflux disease)   . History of alcoholism (HCC)   . Hypertension     Current Outpatient Medications on File Prior to Visit  Medication Sig Dispense Refill  . albuterol (PROVENTIL) (2.5 MG/3ML) 0.083% nebulizer solution USE 1 VIAL IN NEBULIZER EVERY 4 HOURS 75 mL 11  . albuterol (VENTOLIN HFA) 108 (90 Base) MCG/ACT inhaler INHALE 2 PUFFS INTO THE LUNGS EVERY 6 HOURS AS NEEDED FOR WHEEZING OR SHORTNESS OF BREATH 8.5 g 3  . ALPRAZolam (XANAX) 0.5 MG tablet Take 1 tablet by mouth 2 (two)  times daily as needed.  (Patient not taking: Reported on 10/05/2019)    . amLODipine (NORVASC) 10 MG tablet Take 1 tablet (10 mg total) by mouth at bedtime. 90 tablet 3  . azithromycin (ZITHROMAX) 250 MG tablet Zpack taper as directed 6 tablet 0  . carvedilol (COREG) 3.125 MG tablet TAKE 1 TABLET BY MOUTH 2 TIMES DAILY WITH A MEAL. 60 tablet 0  . CHANTIX CONTINUING MONTH PAK 1 MG tablet TAKE 1 TABLET BY MOUTH TWICE DAILY 56 tablet 3  . doxycycline (VIBRA-TABS) 100 MG tablet Take 1 tablet (100 mg total) by mouth 2 (two) times daily. 14 tablet 0  . ferrous sulfate 325 (65 FE) MG EC tablet Take 1 tablet by mouth 2 (two) times daily.    . fluticasone (FLONASE) 50 MCG/ACT nasal spray INSTILL 1 SPRAY INTO EACH NOSTRIL TWICE DAILY 16 g 2  . furosemide (LASIX) 40 MG tablet Take 1 tablet (40 mg total) by mouth daily as needed. 30 tablet 3  . ibuprofen (ADVIL,MOTRIN) 800 MG tablet Take 1 tablet (800 mg total) by mouth every 8 (eight) hours as needed. 30 tablet 0  . INCRUSE ELLIPTA 62.5 MCG/INH AEPB INHALE 1 PUFF INTO THE LUNGS DAILY. 30 each 3  . loratadine (CLARITIN) 10 MG tablet Take 1 tablet (10 mg total) by mouth daily. 30 tablet 11  . montelukast (SINGULAIR) 10 MG tablet TAKE 1 TABLET BY MOUTH AT BEDTIME. 30 tablet 12  . mupirocin ointment (BACTROBAN) 2 % PLACE  1 APPLICATION INTO THE NOSE 2 TIMES DAILY. 22 g 0  . omeprazole (PRILOSEC) 20 MG capsule TAKE 1 CAPSULE BY MOUTH DAILY 60 capsule 0  . OVER THE COUNTER MEDICATION Take 1 tablet by mouth every 12 (twelve) hours. mucus relief    . OVER THE COUNTER MEDICATION Take by mouth every 6 (six) hours as needed. Store brand niquil, 1 tablespoon ever 6 hours.    Marland Kitchen oxyCODONE (OXYCONTIN) 20 mg 12 hr tablet Take 10 mg by mouth.     . OXYGEN Place 2 L/min into the nose as directed. Oxygen via [place] @@ 2L/min on exertion with stationary concentrator and portable gas tank.    . TRAVATAN Z 0.004 % SOLN ophthalmic solution Place 1 drop into both eyes at bedtime.    .  [DISCONTINUED] carvedilol (COREG) 3.125 MG tablet TAKE 1 TABLET BY MOUTH 2 TIMES DAILY WITH A MEAL. 60 tablet 0   No current facility-administered medications on file prior to visit.      Observations/Objective: Speaks in full sentences with no audible distress   Assessment and Plan: Acute COPD exacerbation starting to improve on antibiotics.  We will add short course of prednisone COVID-19 test reported as negative  Chronic respiratory failure with adequate oxygen on 2 L   Tobacco Abuse -smoking cessation discussed  Plan  Patient Instructions  Finish Zpack as directed  Mucinex DM Twice daily  As needed  Cough/congestion  Begin Prednisone taper over next week.  Continue on Advair and Incruse  Work on not smoking  Follow up with Dr. Maple Hudson  In 6-8 weeks and As needed   Please contact office for sooner follow up if symptoms do not improve or worsen or seek emergency care       Follow Up Instructions: Follow-up in 6 to 8 weeks and as needed   I discussed the assessment and treatment plan with the patient. The patient was provided an opportunity to ask questions and all were answered. The patient agreed with the plan and demonstrated an understanding of the instructions.   The patient was advised to call back or seek an in-person evaluation if the symptoms worsen or if the condition fails to improve as anticipated.  I provided 22 minutes of non-face-to-face time during this encounter.   Rubye Oaks, NP

## 2020-02-20 NOTE — Patient Instructions (Addendum)
Finish Zpack as directed  Mucinex DM Twice daily  As needed  Cough/congestion  Begin Prednisone taper over next week.  Continue on Advair and Incruse  Work on not smoking  Follow up with Dr. Maple Hudson  In 6-8 weeks and As needed   Please contact office for sooner follow up if symptoms do not improve or worsen or seek emergency care

## 2020-02-21 ENCOUNTER — Other Ambulatory Visit: Payer: Self-pay | Admitting: Internal Medicine

## 2020-02-21 ENCOUNTER — Other Ambulatory Visit: Payer: Self-pay | Admitting: Family Medicine

## 2020-02-21 DIAGNOSIS — I10 Essential (primary) hypertension: Secondary | ICD-10-CM

## 2020-02-22 ENCOUNTER — Telehealth: Payer: Self-pay

## 2020-02-22 NOTE — Telephone Encounter (Signed)
Pt had televisit with TP on 02/20/20.  Pt was advised to have a follow up with CY in 6-8 weeks. lmtcb X1 for pt to schedule follow up.  AVS sent to patient.

## 2020-02-23 NOTE — Telephone Encounter (Signed)
ATC patient x2, left vm requesting a return call to schedule f/u visit with Dr. Maple Hudson in 6-8 weeks per Rubye Oaks NP.  Will await return call.

## 2020-03-01 NOTE — Telephone Encounter (Signed)
ATC x3.  LVM for patient to return call to schedule 6-8 wk f/u with Dr. Maple Hudson (saw TP on 03/01/20).  Placed recall as well.  Nothing further needed.

## 2020-03-05 ENCOUNTER — Telehealth: Payer: Self-pay | Admitting: Internal Medicine

## 2020-03-05 MED ORDER — AMOXICILLIN-POT CLAVULANATE 875-125 MG PO TABS: 1.0000 | ORAL_TABLET | Freq: Two times a day (BID) | ORAL | 0 refills | Status: DC

## 2020-03-05 MED ORDER — PREDNISONE 10 MG PO TABS: ORAL_TABLET | ORAL | 0 refills | Status: DC

## 2020-03-05 NOTE — Telephone Encounter (Signed)
Spoke with patient. She is aware of CY's recommendations. Will go ahead and call in the augmentin and prednisone for her. Nothing further needed at time of call.

## 2020-03-05 NOTE — Telephone Encounter (Signed)
Primary Pulmonologist: CY Last office visit and with whom: 02/20/20 with TP What do we see them for (pulmonary problems): asthma Last OV assessment/plan:  Finish Zpack as directed  Mucinex DM Twice daily  As needed  Cough/congestion  Begin Prednisone taper over next week.  Continue on Advair and Incruse  Work on not smoking  Follow up with Dr. Maple Hudson  In 6-8 weeks and As needed   Please contact office for sooner follow up if symptoms do not improve or worsen or seek emergency care    Was appointment offered to patient (explain)?  Patient wanted recommendations first.    Reason for call: Spoke with patient. She stated that she completed the zpak and prednisone that were prescribed earlier this month. She is still not feeling any better. She has a productive cough with thick, yellowish phlegm. Increased wheezing and SOB. She denied being around anyone else who has been sick. Also denied any body aches.   She is wanting another round of antibiotics and prednisone.   Pharmacy is [facility].      Allergies  Allergen Reactions  . Cephalexin Itching and Rash  . Acetaminophen Nausea Only  . Asa [Aspirin]   . Hydrocodone-Acetaminophen     REACTION: itching  . Morphine And Related   . Sulfamethoxazole-Trimethoprim Rash    Immunization History  Administered Date(s) Administered  . Influenza Whole 01/26/2007  . Influenza,inj,Quad PF,6+ Mos 11/26/2012, 12/19/2013, 12/24/2014, 02/13/2018, 12/06/2018  . Influenza-Unspecified 01/09/2015, 12/04/2015  . Td 04/10/2006  . Tdap 10/01/2017   Dr. Maple Hudson, can you please advise? Thanks!

## 2020-03-05 NOTE — Telephone Encounter (Signed)
Please offer augmentin 875, # 14, 1 twice daily                      Prednisone 10 mg, # 20, 4 X 2 DAYS, 3 X 2 DAYS, 2 X 2 DAYS, 1 X 2 DAYS

## 2020-03-22 ENCOUNTER — Other Ambulatory Visit: Payer: Self-pay | Admitting: Family Medicine

## 2020-03-22 ENCOUNTER — Other Ambulatory Visit: Payer: Self-pay | Admitting: Internal Medicine

## 2020-03-22 DIAGNOSIS — K219 Gastro-esophageal reflux disease without esophagitis: Secondary | ICD-10-CM

## 2020-03-22 DIAGNOSIS — M25551 Pain in right hip: Secondary | ICD-10-CM

## 2020-04-17 ENCOUNTER — Telehealth: Payer: Self-pay | Admitting: Internal Medicine

## 2020-04-17 MED ORDER — AZITHROMYCIN 250 MG PO TABS: ORAL_TABLET | ORAL | 0 refills | Status: DC

## 2020-04-17 NOTE — Telephone Encounter (Signed)
Called and spoke with pt who states she developed a cough yesterday 2/7. Pt is coughing up yellow-green phlegm and when she has a coughing spell, her O2 sats will drop to 86% with her o2 on. Pt is wearing O2 at 2L.  Pt states that she has had a headache from all the coughing she has been doing. Pt denies any complaints of fever as last temp was 96.9.  Pt has not received her covid vaccines and has not been tested for covid recently either.  Pt states that she has not had to use rescue inhaler but does use nebulizer 4 times daily.  I did recommend to pt that she goes get covid tested due to her symptoms and also stated to her that we would check with CY for further recommendations and she verbalized understanding.  Dr. Maple Hudson, please advise.  Allergies  Allergen Reactions  . Cephalexin Itching and Rash  . Acetaminophen Nausea Only  . Asa [Aspirin]   . Hydrocodone-Acetaminophen     REACTION: itching  . Morphine And Related   . Sulfamethoxazole-Trimethoprim Rash     Current Outpatient Medications:  .  albuterol (PROVENTIL) (2.5 MG/3ML) 0.083% nebulizer solution, USE 1 VIAL IN NEBULIZER EVERY 4 HOURS, Disp: 75 mL, Rfl: 11 .  albuterol (VENTOLIN HFA) 108 (90 Base) MCG/ACT inhaler, INHALE 2 PUFFS INTO THE LUNGS EVERY 6 HOURS AS NEEDED FOR WHEEZING OR SHORTNESS OF BREATH, Disp: 8.5 g, Rfl: 2 .  ALPRAZolam (XANAX) 0.5 MG tablet, Take 1 tablet by mouth 2 (two) times daily as needed.  (Patient not taking: Reported on 10/05/2019), Disp: , Rfl:  .  amLODipine (NORVASC) 10 MG tablet, Take 1 tablet (10 mg total) by mouth at bedtime., Disp: 90 tablet, Rfl: 3 .  amoxicillin-clavulanate (AUGMENTIN) 875-125 MG tablet, Take 1 tablet by mouth 2 (two) times daily., Disp: 14 tablet, Rfl: 0 .  azithromycin (ZITHROMAX) 250 MG tablet, Zpack taper as directed, Disp: 6 tablet, Rfl: 0 .  carvedilol (COREG) 3.125 MG tablet, TAKE 1 TABLET BY MOUTH 2 TIMES DAILY WITH A MEAL., Disp: 60 tablet, Rfl: 0 .  CHANTIX  CONTINUING MONTH PAK 1 MG tablet, TAKE 1 TABLET BY MOUTH TWICE DAILY, Disp: 56 tablet, Rfl: 3 .  doxycycline (VIBRA-TABS) 100 MG tablet, Take 1 tablet (100 mg total) by mouth 2 (two) times daily., Disp: 14 tablet, Rfl: 0 .  ferrous sulfate 325 (65 FE) MG EC tablet, Take 1 tablet by mouth 2 (two) times daily., Disp: , Rfl:  .  fluticasone (FLONASE) 50 MCG/ACT nasal spray, INSTILL 1 SPRAY INTO EACH NOSTRIL TWICE DAILY, Disp: 16 g, Rfl: 2 .  Fluticasone-Salmeterol (ADVAIR DISKUS) 500-50 MCG/DOSE AEPB, INHALE 1 PUFF INTO THE LUNGS TWICE DAILY, Disp: 60 each, Rfl: 5 .  furosemide (LASIX) 40 MG tablet, Take 1 tablet (40 mg total) by mouth daily as needed., Disp: 30 tablet, Rfl: 3 .  ibuprofen (ADVIL,MOTRIN) 800 MG tablet, Take 1 tablet (800 mg total) by mouth every 8 (eight) hours as needed., Disp: 30 tablet, Rfl: 0 .  INCRUSE ELLIPTA 62.5 MCG/INH AEPB, INHALE 1 PUFF INTO THE LUNGS DAILY, Disp: 30 each, Rfl: 2 .  loratadine (CLARITIN) 10 MG tablet, Take 1 tablet (10 mg total) by mouth daily., Disp: 30 tablet, Rfl: 11 .  montelukast (SINGULAIR) 10 MG tablet, TAKE 1 TABLET BY MOUTH AT BEDTIME., Disp: 30 tablet, Rfl: 12 .  mupirocin ointment (BACTROBAN) 2 %, PLACE 1 APPLICATION INTO THE NOSE 2 TIMES DAILY., Disp: 22 g, Rfl: 0 .  omeprazole (PRILOSEC) 20 MG capsule, TAKE 1 CAPSULE BY MOUTH DAILY, Disp: 60 capsule, Rfl: 0 .  OVER THE COUNTER MEDICATION, Take 1 tablet by mouth every 12 (twelve) hours. mucus relief, Disp: , Rfl:  .  OVER THE COUNTER MEDICATION, Take by mouth every 6 (six) hours as needed. Store brand niquil, 1 tablespoon ever 6 hours., Disp: , Rfl:  .  oxyCODONE (OXYCONTIN) 20 mg 12 hr tablet, Take 10 mg by mouth. , Disp: , Rfl:  .  OXYGEN, Place 2 L/min into the nose as directed. Oxygen via [place] @@ 2L/min on exertion with stationary concentrator and portable gas tank., Disp: , Rfl:  .  predniSONE (DELTASONE) 10 MG tablet, Take 4 tabs for 2 days, then 3 tabs for 2 days, 2 tabs for 2 days, then 1  tab for 2 days, then stop., Disp: 20 tablet, Rfl: 0 .  TRAVATAN Z 0.004 % SOLN ophthalmic solution, Place 1 drop into both eyes at bedtime., Disp: , Rfl:

## 2020-04-17 NOTE — Telephone Encounter (Signed)
Called and spoke with pt letting her know the info stated by CY and let her know that we were going to send zpak to pharmacy for her. Pt verbalized understanding. Rx has been sent to pharmacy for pt. nothing further needed.

## 2020-04-17 NOTE — Telephone Encounter (Signed)
Offer a Zpak 250 mg, # 6, 2 today then one daily  Recommend otc cough syrup Delsym and Ricola sugar free lozenges  Agree strongly advise covid testing

## 2020-04-23 ENCOUNTER — Other Ambulatory Visit: Payer: Self-pay | Admitting: Family Medicine

## 2020-04-23 DIAGNOSIS — I10 Essential (primary) hypertension: Secondary | ICD-10-CM

## 2020-04-23 DIAGNOSIS — M25551 Pain in right hip: Secondary | ICD-10-CM

## 2020-04-24 NOTE — Telephone Encounter (Signed)
Nothing noted in message. Will close encounter.  

## 2020-05-15 ENCOUNTER — Telehealth: Payer: Self-pay | Admitting: Internal Medicine

## 2020-05-15 MED ORDER — DOXYCYCLINE HYCLATE 100 MG PO TABS: 100.0000 mg | ORAL_TABLET | Freq: Two times a day (BID) | ORAL | 0 refills | Status: DC

## 2020-05-15 NOTE — Telephone Encounter (Signed)
Primary Pulmonologist: CY Last office visit and with whom: 02/20/2020 with TP for televisit What do we see them for (pulmonary problems): COPD Last OV assessment/plan:   Acute COPD exacerbation starting to improve on antibiotics.  We will add short course of prednisone COVID-19 test reported as negative  Chronic respiratory failure with adequate oxygen on 2 L   Tobacco Abuse -smoking cessation discussed  Plan  Patient Instructions  Finish Zpack as directed  Mucinex DM Twice daily  As needed  Cough/congestion  Begin Prednisone taper over next week.  Continue on Advair and Incruse  Work on not smoking  Follow up with Dr. Maple Hudson  In 6-8 weeks and As needed   Please contact office for sooner follow up if symptoms do not improve or worsen or seek emergency care       Follow Up Instructions: Follow-up in 6 to 8 weeks and as needed  I discussed the assessment and treatment plan with the patient. The patient was provided an opportunity to ask questions and all were answered. The patient agreed with the plan and demonstrated an understanding of the instructions.  The patient was advised to call back or seek an in-person evaluation if the symptoms worsen or if the condition fails to improve as anticipated.  I provided 22 minutes of non-face-to-face time during this encounter.     Was appointment offered to patient (explain)?  no   Reason for call:  Called and spoke with pt and she stated that her husband just had a kidney transplant and he will be coming home soon.  She does not want to get his sick.  She stated that she has gotten sick again due to the weather changing.  She has sinus congestion and a cough that is producing green sputum.  Temp has been around 99 at times.  She denies any other symptoms.      Allergies  Allergen Reactions  . Cephalexin Itching and Rash  . Acetaminophen Nausea Only  . Asa [Aspirin]   . Hydrocodone-Acetaminophen     REACTION: itching   . Morphine And Related   . Sulfamethoxazole-Trimethoprim Rash    Immunization History  Administered Date(s) Administered  . Influenza Whole 01/26/2007  . Influenza,inj,Quad PF,6+ Mos 11/26/2012, 12/19/2013, 12/24/2014, 02/13/2018, 12/06/2018  . Influenza-Unspecified 01/09/2015, 12/04/2015  . Td 04/10/2006  . Tdap 10/01/2017

## 2020-05-15 NOTE — Telephone Encounter (Signed)
Please send doxycycline 100 mg, # 14, 1 twice daily 

## 2020-05-15 NOTE — Telephone Encounter (Signed)
I called the pt and she is aware of rx sent to her pharmacy. Nothing further is needed.

## 2020-05-25 ENCOUNTER — Other Ambulatory Visit: Payer: Self-pay | Admitting: Family Medicine

## 2020-05-25 DIAGNOSIS — K219 Gastro-esophageal reflux disease without esophagitis: Secondary | ICD-10-CM

## 2020-05-25 DIAGNOSIS — I10 Essential (primary) hypertension: Secondary | ICD-10-CM

## 2020-05-25 DIAGNOSIS — M25551 Pain in right hip: Secondary | ICD-10-CM

## 2020-06-11 ENCOUNTER — Telehealth: Payer: Self-pay | Admitting: Internal Medicine

## 2020-06-11 MED ORDER — DOXYCYCLINE HYCLATE 100 MG PO TABS: 100.0000 mg | ORAL_TABLET | Freq: Two times a day (BID) | ORAL | 0 refills | Status: DC

## 2020-06-11 NOTE — Telephone Encounter (Signed)
Primary Pulmonologist: Dr Maple Hudson Last office visit and with whom: Dr Maple Hudson 02/20/20 What do we see them for (pulmonary problems): COPD Last OV assessment/plan:  Instructions  Finish Zpack as directed  Mucinex DM Twice daily  As needed  Cough/congestion  Begin Prednisone taper over next week.  Continue on Advair and Incruse  Work on not smoking  Follow up with Dr. Maple Hudson  In 6-8 weeks and As needed   Please contact office for sooner follow up if symptoms do not improve or worsen or seek emergency care          Reason for call:  Called and spoke with Patient. Patient stated she has been having increased cough the last few days, but today is worse. Patient stated she is having a productive cough, green/yellow sputum, some wheezing, head congestion, and runny nose. Patient stated she has been taking Nyquil, but it has not helped. Patient denies any fever or covid exposures.  Patient request a antibiotic be sent to [facility].  Message routed to  Dr. Maple Hudson to advise     Allergies  Allergen Reactions  . Cephalexin Itching and Rash  . Acetaminophen Nausea Only  . Asa [Aspirin]   . Hydrocodone-Acetaminophen     REACTION: itching  . Morphine And Related   . Sulfamethoxazole-Trimethoprim Rash    Immunization History  Administered Date(s) Administered  . Influenza Whole 01/26/2007  . Influenza,inj,Quad PF,6+ Mos 11/26/2012, 12/19/2013, 12/24/2014, 02/13/2018, 12/06/2018  . Influenza-Unspecified 01/09/2015, 12/04/2015  . Td 04/10/2006  . Tdap 10/01/2017  2

## 2020-06-11 NOTE — Telephone Encounter (Signed)
Please send doxycycline 100 mg, # 14, 1 twice daily 

## 2020-06-11 NOTE — Telephone Encounter (Signed)
Called and spoke with Patient. Dr. Roxy Cedar recommendations given. Understanding stated.  Doxycycline prescription sent to [facility]. Nothing further at this time.

## 2020-06-12 ENCOUNTER — Other Ambulatory Visit: Payer: Self-pay | Admitting: Internal Medicine

## 2020-06-12 ENCOUNTER — Other Ambulatory Visit: Payer: Self-pay | Admitting: Family Medicine

## 2020-06-12 DIAGNOSIS — I10 Essential (primary) hypertension: Secondary | ICD-10-CM

## 2020-06-12 DIAGNOSIS — M25551 Pain in right hip: Secondary | ICD-10-CM

## 2020-06-12 DIAGNOSIS — K219 Gastro-esophageal reflux disease without esophagitis: Secondary | ICD-10-CM

## 2020-06-19 ENCOUNTER — Other Ambulatory Visit: Payer: Self-pay | Admitting: Family Medicine

## 2020-06-19 DIAGNOSIS — K219 Gastro-esophageal reflux disease without esophagitis: Secondary | ICD-10-CM

## 2020-06-19 DIAGNOSIS — I10 Essential (primary) hypertension: Secondary | ICD-10-CM

## 2020-06-19 DIAGNOSIS — M25551 Pain in right hip: Secondary | ICD-10-CM

## 2020-06-21 ENCOUNTER — Other Ambulatory Visit: Payer: Self-pay | Admitting: Family Medicine

## 2020-06-21 DIAGNOSIS — M25551 Pain in right hip: Secondary | ICD-10-CM

## 2020-06-21 DIAGNOSIS — I10 Essential (primary) hypertension: Secondary | ICD-10-CM

## 2020-06-21 DIAGNOSIS — K219 Gastro-esophageal reflux disease without esophagitis: Secondary | ICD-10-CM

## 2020-06-25 NOTE — Telephone Encounter (Signed)
Attempted to reach pt no answer. LVM for pt to call the office to make appt for check up. Aquilla Solian, CMA

## 2020-07-13 ENCOUNTER — Telehealth: Payer: Self-pay | Admitting: Internal Medicine

## 2020-07-13 ENCOUNTER — Other Ambulatory Visit: Payer: Self-pay | Admitting: Family Medicine

## 2020-07-13 DIAGNOSIS — I10 Essential (primary) hypertension: Secondary | ICD-10-CM

## 2020-07-13 DIAGNOSIS — M25551 Pain in right hip: Secondary | ICD-10-CM

## 2020-07-13 DIAGNOSIS — K219 Gastro-esophageal reflux disease without esophagitis: Secondary | ICD-10-CM

## 2020-07-13 MED ORDER — DOXYCYCLINE HYCLATE 100 MG PO TABS: 100.0000 mg | ORAL_TABLET | Freq: Two times a day (BID) | ORAL | 0 refills | Status: DC

## 2020-07-13 NOTE — Telephone Encounter (Signed)
Called and updated patient on Dr Roxy Cedar response/recommendations. Patient aware and expressed full understanding of doxy script being sent to pharmacy and how to take it. Nothing further needed at this time.

## 2020-07-13 NOTE — Telephone Encounter (Signed)
Doxycycline 100 mg  # 14, 1 twice daily

## 2020-07-13 NOTE — Telephone Encounter (Signed)
Primary Pulmonologist: CY Last office visit and with whom: 02/20/20 with TP What do we see them for (pulmonary problems): copd Last OV assessment/plan:   Assessment and Plan: Acute COPD exacerbation starting to improve on antibiotics.  We will add short course of prednisone COVID-19 test reported as negative  Chronic respiratory failure with adequate oxygen on 2 L   Tobacco Abuse -smoking cessation discussed  Plan  Patient Instructions  Finish Zpack as directed  Mucinex DM Twice daily  As needed  Cough/congestion  Begin Prednisone taper over next week.  Continue on Advair and Incruse  Work on not smoking  Follow up with Dr. Maple Hudson  In 6-8 weeks and As needed   Please contact office for sooner follow up if symptoms do not improve or worsen or seek emergency care   Was appointment offered to patient (explain)?  no   Reason for call:   Pt took covid home test yesterday and this was negative.  Symptoms started yesterday with cough with green sputum, wheezing, SHOB, chest congestion, low grade temp ( pt said she didn't remember the number).  She has been using OTC cough meds and her nebulizer every 4 hours.  She is requesting that abx be sent to her pharmacy.  Any further recs for her?    Allergies  Allergen Reactions  . Cephalexin Itching and Rash  . Acetaminophen Nausea Only  . Asa [Aspirin]   . Hydrocodone-Acetaminophen     REACTION: itching  . Morphine And Related   . Sulfamethoxazole-Trimethoprim Rash    Immunization History  Administered Date(s) Administered  . Influenza Whole 01/26/2007  . Influenza,inj,Quad PF,6+ Mos 11/26/2012, 12/19/2013, 12/24/2014, 02/13/2018, 12/06/2018  . Influenza-Unspecified 01/09/2015, 12/04/2015  . Td 04/10/2006  . Tdap 10/01/2017

## 2020-07-16 ENCOUNTER — Ambulatory Visit: Payer: Medicare Other | Admitting: Family Medicine

## 2020-07-23 ENCOUNTER — Ambulatory Visit: Payer: Medicare (Managed Care) | Admitting: Family Medicine

## 2020-08-13 ENCOUNTER — Other Ambulatory Visit: Payer: Self-pay | Admitting: Internal Medicine

## 2020-08-14 ENCOUNTER — Other Ambulatory Visit: Payer: Self-pay | Admitting: Internal Medicine

## 2020-08-14 ENCOUNTER — Other Ambulatory Visit: Payer: Self-pay | Admitting: Adult Health

## 2020-08-15 ENCOUNTER — Telehealth: Payer: Self-pay | Admitting: Internal Medicine

## 2020-08-15 MED ORDER — AMOXICILLIN-POT CLAVULANATE 875-125 MG PO TABS: 1.0000 | ORAL_TABLET | Freq: Two times a day (BID) | ORAL | 0 refills | Status: DC

## 2020-08-15 NOTE — Telephone Encounter (Signed)
Augmentin 875 # 14, 1 twice daily

## 2020-08-15 NOTE — Telephone Encounter (Signed)
Called and spoke with pt who is requesting meds to be sent in.  Pt has had complaints of cough with yellow/green phlegm, congestion, and wheezing which began yesterday 6/7.  Pt took a covid test yesterday 6/7 which came back negative.  Pt said she took her temp about an hour and it was 99.0.  Pt has not been seen since 10/11/2018 but does have an upcoming appt 09/27/20.  Pt is requesting to have meds sent in to see if she can start to feel better to hopefully keep her from having to go to the hospital. Dr. Maple Hudson, please advise.   Allergies  Allergen Reactions  . Cephalexin Itching and Rash  . Acetaminophen Nausea Only  . Asa [Aspirin]   . Hydrocodone-Acetaminophen     REACTION: itching  . Morphine And Related   . Sulfamethoxazole-Trimethoprim Rash    Current Outpatient Medications:  .  ADVAIR DISKUS 500-50 MCG/ACT AEPB, INHALE 1 PUFF INTO THE LUNGS TWICE DAILY, Disp: 60 each, Rfl: 4 .  albuterol (PROVENTIL) (2.5 MG/3ML) 0.083% nebulizer solution, USE 1 VIAL IN NEBULIZER EVERY 4 HOURS, Disp: 75 mL, Rfl: 11 .  albuterol (VENTOLIN HFA) 108 (90 Base) MCG/ACT inhaler, INHALE 2 PUFFS INTO THE LUNGS EVERY 6 HOURS AS NEEDED FOR WHEEZING OR SHORTNESS OF BREATH, Disp: 8.5 g, Rfl: 0 .  ALPRAZolam (XANAX) 0.5 MG tablet, Take 1 tablet by mouth 2 (two) times daily as needed.  (Patient not taking: Reported on 10/05/2019), Disp: , Rfl:  .  amLODipine (NORVASC) 10 MG tablet, Take 1 tablet (10 mg total) by mouth at bedtime., Disp: 90 tablet, Rfl: 3 .  carvedilol (COREG) 3.125 MG tablet, TAKE 1 TABLET BY MOUTH 2 TIMES DAILY WITH A MEAL, Disp: 60 tablet, Rfl: 0 .  CHANTIX CONTINUING MONTH PAK 1 MG tablet, TAKE 1 TABLET BY MOUTH TWICE DAILY, Disp: 56 tablet, Rfl: 3 .  ferrous sulfate 325 (65 FE) MG EC tablet, Take 1 tablet by mouth 2 (two) times daily., Disp: , Rfl:  .  fluticasone (FLONASE) 50 MCG/ACT nasal spray, INSTILL 1 SPRAY INTO EACH NOSTRIL TWICE DAILY, Disp: 16 g, Rfl: 2 .  furosemide (LASIX) 40 MG  tablet, Take 1 tablet (40 mg total) by mouth daily as needed., Disp: 30 tablet, Rfl: 3 .  ibuprofen (ADVIL,MOTRIN) 800 MG tablet, Take 1 tablet (800 mg total) by mouth every 8 (eight) hours as needed., Disp: 30 tablet, Rfl: 0 .  INCRUSE ELLIPTA 62.5 MCG/INH AEPB, INHALE 1 PUFF INTO THE LUNGS DAILY, Disp: 30 each, Rfl: 0 .  loratadine (CLARITIN) 10 MG tablet, Take 1 tablet (10 mg total) by mouth daily., Disp: 30 tablet, Rfl: 11 .  montelukast (SINGULAIR) 10 MG tablet, TAKE 1 TABLET BY MOUTH AT BEDTIME., Disp: 30 tablet, Rfl: 12 .  mupirocin ointment (BACTROBAN) 2 %, PLACE 1 APPLICATION INTO THE NOSE 2 TIMES DAILY., Disp: 22 g, Rfl: 0 .  omeprazole (PRILOSEC) 20 MG capsule, TAKE 1 CAPSULE BY MOUTH DAILY, Disp: 60 capsule, Rfl: 0 .  OVER THE COUNTER MEDICATION, Take 1 tablet by mouth every 12 (twelve) hours. mucus relief, Disp: , Rfl:  .  OVER THE COUNTER MEDICATION, Take by mouth every 6 (six) hours as needed. Store brand niquil, 1 tablespoon ever 6 hours., Disp: , Rfl:  .  oxyCODONE (OXYCONTIN) 20 mg 12 hr tablet, Take 10 mg by mouth. , Disp: , Rfl:  .  OXYGEN, Place 2 L/min into the nose as directed. Oxygen via [place] @@ 2L/min on exertion with stationary  concentrator and portable gas tank., Disp: , Rfl:  .  TRAVATAN Z 0.004 % SOLN ophthalmic solution, Place 1 drop into both eyes at bedtime., Disp: , Rfl:

## 2020-08-15 NOTE — Telephone Encounter (Signed)
Called and spoke with pt letting her know the recs stated by CY and she verbalized understanding. Verified preferred pharmacy and sent augmentin abx in for pt. Nothing further needed.

## 2020-09-04 ENCOUNTER — Telehealth: Payer: Self-pay | Admitting: Internal Medicine

## 2020-09-04 NOTE — Telephone Encounter (Signed)
I called and spoke with Shelly at [employer] regarding O2 notes. In looking at chart, patient was supposed to come in office 02/2019 to be walked to qualify for O2 and patient has not done this yet. Patient said home health nurse walked her and was told it needed to be done in office for Dr. Maple Hudson to send in order. Patient has not done this, patient has an appt with Dr. Maple Hudson on 09/27/20, informed shelly that I will have walk done then. Shelly verbalized understanding, nothing further needed.

## 2020-09-12 ENCOUNTER — Other Ambulatory Visit: Payer: Self-pay | Admitting: Internal Medicine

## 2020-09-26 NOTE — Progress Notes (Deleted)
HPI female one pack per day smoker followed for COPD mixed type, Chronic Respiratory Failure Hypoxic,, lung nodules, seasonal allergic rhinitis, tobacco abuse, complicated by hip surgery/infection/repair, anxiety/depression, GERD, HBP, obesity, Husband is a patient here ( blind and on dialysis)  Allergy skin test- significant positives for grass weed and tree pollens, dust mite, cockroach. Office Spirometry-08/19/2013-severe obstructive airways disease. FVC 2.05/71%, FEV1 1.04/42%, FEV1/FVC 0.51, FEF 25-75%  0.50/17% Office spirometry 07/06/14-severe obstructive airways disease. FVC 2.37/79%, FEV1 0.92/37%, FEV1/FVC 0.39, FEF 25-75 percent 0.26/9%  -----------------------------------------------------------------------------------------   10/11/2018- Virtual Visit via Telephone Note  I connected with [name] on 09/26/20 at 10:00 AM EDT by telephone and verified that I am speaking with the correct person using two identifiers.  Location: Patient: H Provider: O   I discussed the limitations, risks, security and privacy concerns of performing an evaluation and management service by telephone and the availability of in person appointments. I also discussed with the patient that there may be a patient responsible charge related to this service. The patient expressed understanding and agreed to proceed.   History of Present Illness: 55 year old female one pack per day smoker followed for COPD mixed type, lung nodules, Chronic resp failure hypoxic, seasonal allergic rhinitis, tobacco abuse, complicated by hip surgery/infection/repair, anxiety/depression, GERD, HBP, obesity, O2 2 L continuous  -Incruse, Singulair, Claritin,  Advair 500, Chantix, albuterol hfa, neb albuterol,  -----(see telephone note 07/31) pt states she is doing better since augmentin, OTC cough medication, and breathing treatments; reports cough w/ white mucus Says quit smoking "1 weeks ago"- strongly supported in our  discussion. Latest hip surgery 02/10/18- repeated complications. She doesn't intend to allow any more hips surgery. Has Home Health RN. We will ask her to get O2 sat at rest on room air to qualify for O2.  Home oximetry usually 95-98% on O2 2L.  Needs new O2 supplier. Wants [employer] 1 machine at 2L.  Hosp this Spring with pneumonia. Sputum now white as she finishes course of augmentin we called in 7/31.  Observations/Objective:   Assessment and Plan: Chronic resp failure hypoxic- HHRN to get O2 sat rest room air to qualify for [employer] POC COPD mixed type- chronic smoker. Meds sufficient if she can stay off cigs.  Tobacco- supporting cessation Follow Up Instructions: 6 months  I discussed the assessment and treatment plan with the patient. The patient was provided an opportunity to ask questions and all were answered. The patient agreed with the plan and demonstrated an understanding of the instructions.   The patient was advised to call back or seek an in-person evaluation if the symptoms worsen or if the condition fails to improve as anticipated.  I provided 21 minutes of non-face-to-face time during this encounter.   Jetty Duhamel, MD  09/27/20- 55 year old female one pack per day smoker followed for COPD mixed type, lung nodules, Chronic resp failure hypoxic, seasonal allergic rhinitis, tobacco abuse, complicated by hip surgery/infection/repair, anxiety/depression, GERD, HBP, obesity, O2 2 L continuous  -Incruse, Singulair, Claritin,  Advair 500, Chantix, albuterol hfa, neb albuterol,  Covid vax- Needs walk test- O2 qualifying so she can get O2/ [employer].    ROS-see HPI     + = positive Constitutional:   No-   weight loss, night sweats, fevers, chills, fatigue, lassitude. HEENT:   No-  headaches, difficulty swallowing, tooth/dental problems, no-sore throat,       No- sneezing, no-itching, ear ache, +nasal congestion,+ post nasal drip,  CV:  No-   chest pain, orthopnea, PND,+  swelling  in lower extremities, no-anasarca,                                                       dizziness, palpitations Resp: + shortness of breath with exertion or at rest.             + productive cough, + non-productive cough,  No- coughing up of blood.              No-   change in color of mucus.  No- wheezing.   Skin: No-   rash or lesions. GI:  + heartburn, indigestion, No-abdominal pain, nausea, vomiting,  GU:  MS:  + joint pain or swelling.   Neuro-     nothing unusual Psych:  No- change in mood or affect. + depression or anxiety.  No memory loss.  OBJ- Physical Exam General- Alert, Oriented, Affect-appropriate, Distress- none acute. + Overweight, + talkative + wheelchair,                                     +O2 Skin- rash-none, lesions- none, excoriation- none Lymphadenopathy- none Head- atraumatic            Eyes- Gross vision intact, PERRLA, conjunctivae and secretions clear            Ears- Hearing, canals-normal            Nose- + turbinate edema, no-Septal dev, +mucus bridging, polyps, erosion, perforation             Throat- Mallampati III , mucosa clear , drainage- none, tonsils- atrophic, + dentures,  Neck- flexible , trachea midline, no stridor , thyroid nl, carotid no bruit Chest - symmetrical excursion , unlabored           Heart/CV- RRR , no murmur , no gallop  , no rub, nl s1 s2                           - JVD- none , edema- none, stasis changes- none, varices- none           Lung-  wheeze + coarse/unlabored, cough+ upper airway rattle,  dullness-none, rub- none           Chest wall-  Abd-  Br/ Gen/ Rectal- Not done, not indicated Extrem- cyanosis- none, clubbing, none, atrophy- none, strength- nl, + wheelchair Neuro- grossly intact to observation

## 2020-09-27 ENCOUNTER — Ambulatory Visit: Payer: Medicare (Managed Care) | Admitting: Internal Medicine

## 2020-10-02 ENCOUNTER — Ambulatory Visit: Payer: Medicare (Managed Care) | Admitting: Family Medicine

## 2020-10-18 ENCOUNTER — Ambulatory Visit: Payer: Medicare (Managed Care) | Admitting: Family Medicine

## 2020-11-13 ENCOUNTER — Encounter: Payer: Self-pay | Admitting: Family Medicine

## 2020-11-13 ENCOUNTER — Other Ambulatory Visit: Payer: Self-pay

## 2020-11-13 ENCOUNTER — Ambulatory Visit (INDEPENDENT_AMBULATORY_CARE_PROVIDER_SITE_OTHER): Payer: Medicare (Managed Care) | Admitting: Family Medicine

## 2020-11-13 VITALS — BP 113/84 | HR 100 | Wt 281.6 lb

## 2020-11-13 DIAGNOSIS — R7303 Prediabetes: Secondary | ICD-10-CM

## 2020-11-13 DIAGNOSIS — F411 Generalized anxiety disorder: Secondary | ICD-10-CM | POA: Diagnosis not present

## 2020-11-13 DIAGNOSIS — I10 Essential (primary) hypertension: Secondary | ICD-10-CM | POA: Diagnosis not present

## 2020-11-13 LAB — POCT GLYCOSYLATED HEMOGLOBIN (HGB A1C): HbA1c, POC (controlled diabetic range): 6.4 % (ref 0.0–7.0)

## 2020-11-13 MED ORDER — FUROSEMIDE 40 MG PO TABS
40.0000 mg | ORAL_TABLET | Freq: Every day | ORAL | 0 refills | Status: DC | PRN
Start: 2020-11-13 — End: 2021-05-09

## 2020-11-13 NOTE — Assessment & Plan Note (Signed)
BP well controlled 113/84 today. Currently on amlodipine 10mg  daily. - Continue current management

## 2020-11-13 NOTE — Progress Notes (Signed)
    SUBJECTIVE:   CHIEF COMPLAINT / HPI:   Patient presents for a check-up as she has not been in the clinic since 2019 and needs prescription refills.   COPD Patient has COPD and is managed by Pulmonology Dr. Maple Hudson. Currently on 2L O2 at baseline. Inhaler medications include Advair, albuterol, and Incruse Ellipta.  Patient is still smoking about 1 pack/week.  Chronic pain Is currently following with Dr. Vear Clock and is attempting to wean down her oxycodone. She is currently on 10mg  4 times daily. She is debating on if she is going to do another hip surgery or not.   Anxiety Patient suffers from extreme anxiety.  She was previously seen by psychiatry and was on multiple medications including Wellbutrin and Celexa (patient cannot remember and any others).  States that she was only ever well controlled when using alprazolam and states that an entire bottle would last for several months.  She is currently having several panic attacks in a week (about 4-5, sometimes 2-3 in a day).  Has no specific trigger and just states "everything going on with no growth".  She also thinks about her sons, 1 of which has a traumatic brain injury and another has autism and is worried about the future for them.   PERTINENT  PMH / PSH: Reviewed  OBJECTIVE:   BP 113/84   Pulse 100   Wt 281 lb 9.6 oz (127.7 kg)   LMP 04/08/2014   BMI 53.21 kg/m   Gen: chronically ill-appearing, appears older than stated age,  CV: RRR, no m/r/g appreciated, trace peripheral edema in BLE Pulm: CTAB, no wheezes/crackles GI: soft, non-tender, non-distended  ASSESSMENT/PLAN:   ANXIETY DISORDER, GENERALIZED Patient does not follow with psychiatry and is currently not on any medications. She reported that Alprazolam was the only medication that has really ever helped her but she has had an issue with Benzo's. She is resistant to trying other medications again and does not feel therapy will be of benefit. Patient induced a small  panic attack during the visit and was able to calm herself down with breathing and an embrace from her son. Patient would likely benefit from therapy and resuming a daily anti-anxiolytic but declines at this time.    Leg swelling Patient has previously been on Lasix 40 mg as needed for leg swelling.  Does not know of any history of heart failure and no documented echocardiogram in the chart.  Patient currently dealing with COPD and a lot of difficulties, unable to differentiate if there is any heart failure component to her shortness of breath.  Patient has minimal edema in bilateral lower extremities.  Do not feel the patient likely needs the furosemide as she has not been on it for a couple of months but will refill for several doses for as needed at this time.  Plan to wean/discontinue after further discussions with the patient.  Prediabetes Patient with significant obesity and history of elevated A1c 5.7 in 2021.  A1c was rechecked today and was 6.4.  Discussed diagnosis of prediabetes and recommend dietary changes.  2022, DO [facility]

## 2020-11-13 NOTE — Patient Instructions (Addendum)
I have refilled some of the Lasix, only take this as needed for swelling. We may need to evaluate the use of this medication in the future. For the anxiety I do still recommend therapy and medications, if you would like to restart either of these please let me know. You are due for several cancer screenings include mammogram, colonoscopy, and pap smears. Please make an appointment with our office when you would like to have these done.   Your A1c today was 6.4, which is still pre-diabetic at this time.

## 2020-11-13 NOTE — Assessment & Plan Note (Signed)
Patient does not follow with psychiatry and is currently not on any medications. She reported that Alprazolam was the only medication that has really ever helped her but she has had an issue with Benzo's. She is resistant to trying other medications again and does not feel therapy will be of benefit. Patient induced a small panic attack during the visit and was able to calm herself down with breathing and an embrace from her son. Patient would likely benefit from therapy and resuming a daily anti-anxiolytic but declines at this time.

## 2020-11-14 ENCOUNTER — Other Ambulatory Visit: Payer: Self-pay | Admitting: Family Medicine

## 2020-11-14 DIAGNOSIS — I10 Essential (primary) hypertension: Secondary | ICD-10-CM

## 2020-11-14 DIAGNOSIS — K219 Gastro-esophageal reflux disease without esophagitis: Secondary | ICD-10-CM

## 2020-11-14 DIAGNOSIS — M25551 Pain in right hip: Secondary | ICD-10-CM

## 2020-12-11 ENCOUNTER — Telehealth: Payer: Self-pay | Admitting: Internal Medicine

## 2020-12-11 MED ORDER — AMOXICILLIN-POT CLAVULANATE 875-125 MG PO TABS: 1.0000 | ORAL_TABLET | Freq: Two times a day (BID) | ORAL | 0 refills | Status: DC

## 2020-12-11 NOTE — Telephone Encounter (Signed)
Augmentin 875 mg, # 14, 1 twice daily °

## 2020-12-11 NOTE — Telephone Encounter (Signed)
I spoke with the pt and notified of response per CDY  Rx was sent  Nothing further needed

## 2020-12-11 NOTE — Telephone Encounter (Signed)
Called and spoke with Patient.  Patient stated she is having productive cough, with green sputum, wheezing, chest congestion, and runny nose.  Patient stated it started 2 days ago.  Patient stated she checked her temp and it was 99.  Patient stated she is taking OTC cough med, nebs 4-6 times a day, albuterol inhaler, Incruse, and Advair.  Patient stated she was covid tested yesterday at her local drug store and test has come back negative. Patient stated her husband and oldest son are out of town in [place], so she does not have anyone to take her to the doctor today, or stay with her youngest son.  Patient is requesting a antibiotic to [facility].   Allergies  Allergen Reactions   Cephalexin Itching and Rash   Acetaminophen Nausea Only   Asa [Aspirin]    Hydrocodone-Acetaminophen     REACTION: itching   Morphine And Related    Sulfamethoxazole-Trimethoprim Rash   Current Outpatient Medications on File Prior to Visit  Medication Sig Dispense Refill   ADVAIR DISKUS 500-50 MCG/ACT AEPB INHALE 1 PUFF INTO THE LUNGS TWICE DAILY 60 each 4   albuterol (PROVENTIL) (2.5 MG/3ML) 0.083% nebulizer solution USE 1 VIAL IN NEBULIZER EVERY 4 HOURS 75 mL 11   albuterol (VENTOLIN HFA) 108 (90 Base) MCG/ACT inhaler INHALE 2 PUFFS INTO THE LUNGS EVERY 6 HOURS AS NEEDED FOR WHEEZING OR SHORTNESS OF BREATH 8.5 g 4   amLODipine (NORVASC) 10 MG tablet TAKE 1 TABLET AT BEDTIME. 90 tablet 2   carvedilol (COREG) 3.125 MG tablet TAKE 1 TABLET BY MOUTH 2 TIMES DAILY WITH A MEAL 60 tablet 0   ferrous sulfate 325 (65 FE) MG EC tablet Take 1 tablet by mouth 2 (two) times daily.     fluticasone (FLONASE) 50 MCG/ACT nasal spray INSTILL 1 SPRAY INTO EACH NOSTRIL TWICE DAILY 16 g 2   furosemide (LASIX) 40 MG tablet Take 1 tablet (40 mg total) by mouth daily as needed. 20 tablet 0   INCRUSE ELLIPTA 62.5 MCG/INH AEPB INHALE 1 PUFF INTO THE LUNGS DAILY 30 each 4   loratadine (CLARITIN) 10 MG tablet Take 1 tablet  (10 mg total) by mouth daily. 30 tablet 11   montelukast (SINGULAIR) 10 MG tablet TAKE 1 TABLET BY MOUTH AT BEDTIME. 30 tablet 12   mupirocin ointment (BACTROBAN) 2 % APPLY TO AFFECTED AREA(S) INTO THE NOSE 2 TIMES DAILY 22 g 0   omeprazole (PRILOSEC) 20 MG capsule TAKE 1 CAPSULE BY MOUTH DAILY 60 capsule 0   OVER THE COUNTER MEDICATION Take 1 tablet by mouth every 12 (twelve) hours. mucus relief     OVER THE COUNTER MEDICATION Take by mouth every 6 (six) hours as needed. Store brand niquil, 1 tablespoon ever 6 hours.     oxyCODONE (OXYCONTIN) 20 mg 12 hr tablet Take 10 mg by mouth.      OXYGEN Place 2 L/min into the nose as directed. Oxygen via [place] @@ 2L/min on exertion with stationary concentrator and portable gas tank.     TRAVATAN Z 0.004 % SOLN ophthalmic solution Place 1 drop into both eyes at bedtime.     varenicline (CHANTIX) 1 MG tablet TAKE 1 TABLET BY MOUTH TWICE DAILY 60 tablet 2   No current facility-administered medications on file prior to visit.   Message routed to Dr. Maple Hudson to advise

## 2020-12-12 ENCOUNTER — Other Ambulatory Visit: Payer: Self-pay

## 2020-12-12 DIAGNOSIS — I10 Essential (primary) hypertension: Secondary | ICD-10-CM

## 2020-12-12 MED ORDER — CARVEDILOL 3.125 MG PO TABS: 3.1250 mg | ORAL_TABLET | Freq: Two times a day (BID) | ORAL | 3 refills | Status: DC

## 2020-12-14 ENCOUNTER — Other Ambulatory Visit: Payer: Self-pay

## 2020-12-14 DIAGNOSIS — K219 Gastro-esophageal reflux disease without esophagitis: Secondary | ICD-10-CM

## 2020-12-14 DIAGNOSIS — M25551 Pain in right hip: Secondary | ICD-10-CM

## 2020-12-15 MED ORDER — OMEPRAZOLE 20 MG PO CPDR: 20.0000 mg | DELAYED_RELEASE_CAPSULE | Freq: Every day | ORAL | 0 refills | Status: DC

## 2020-12-15 MED ORDER — MUPIROCIN 2 % EX OINT: TOPICAL_OINTMENT | CUTANEOUS | 0 refills | Status: DC

## 2020-12-17 ENCOUNTER — Telehealth: Payer: Self-pay | Admitting: Internal Medicine

## 2020-12-17 MED ORDER — DOXYCYCLINE HYCLATE 100 MG PO TABS: 100.0000 mg | ORAL_TABLET | Freq: Two times a day (BID) | ORAL | 0 refills | Status: DC

## 2020-12-17 NOTE — Telephone Encounter (Signed)
Called and spoke with pt and she stated that she has taken the augmentin and today is her last day on this.  She is a little better.  She stated that she is still coughing and the sputum is a lighter green.  She is still wheezing and coughing.  She stated that she cannot come into the office since her husband is out of stated and he would have to bring her.  CY please advise.  She did have notable wheezing while we were talking.    Allergies  Allergen Reactions   Cephalexin Itching and Rash   Acetaminophen Nausea Only   Asa [Aspirin]    Hydrocodone-Acetaminophen     REACTION: itching   Morphine And Related    Sulfamethoxazole-Trimethoprim Rash

## 2020-12-17 NOTE — Telephone Encounter (Signed)
I have called and spoke with [name] and she is aware of doxy that has been sent to the pharmacy.   She is aware of the saline nasal rinses.  Nothing further is needed.

## 2020-12-17 NOTE — Telephone Encounter (Signed)
Doxycycline 100 mg, # 14, 1 twice daily  Suggest sinus rinse with saline- Neti pot or nasal rinse otc like [facility]

## 2021-01-07 ENCOUNTER — Encounter: Payer: Self-pay | Admitting: Primary Care

## 2021-01-07 ENCOUNTER — Telehealth (INDEPENDENT_AMBULATORY_CARE_PROVIDER_SITE_OTHER): Payer: Medicare (Managed Care) | Admitting: Primary Care

## 2021-01-07 ENCOUNTER — Telehealth: Payer: Self-pay | Admitting: Internal Medicine

## 2021-01-07 DIAGNOSIS — J441 Chronic obstructive pulmonary disease with (acute) exacerbation: Secondary | ICD-10-CM

## 2021-01-07 MED ORDER — AZITHROMYCIN 250 MG PO TABS: ORAL_TABLET | ORAL | 0 refills | Status: DC

## 2021-01-07 MED ORDER — PREDNISONE 10 MG PO TABS: ORAL_TABLET | ORAL | 0 refills | Status: DC

## 2021-01-07 NOTE — Patient Instructions (Addendum)
  Recommendations: Sending in Zpack and prednisone taper Continue Advair and Incruse as prescribed Continue OTC Mucinex and start using incentive spirometer DAILY  Follow-up: - Needs regular visit with Dr. Maple Hudson in the next 3 months

## 2021-01-07 NOTE — Telephone Encounter (Signed)
I called the patient and she reports that she is having a cough, congestion and slight fever. She reports that she has been using her nebulizer and it stopped working last night. She had a Mychart visit with a NP this afternoon. Nothing further needed.

## 2021-01-07 NOTE — Progress Notes (Signed)
Virtual Visit via Video Note  I connected with [name] on 01/07/21 at  3:00 PM EDT by a video enabled telemedicine application and verified that I am speaking with the correct person using two identifiers.  Location: Patient: Home Provider: Office   I discussed the limitations of evaluation and management by telemedicine and the availability of in person appointments. The patient expressed understanding and agreed to proceed.  History of Present Illness: 55 year old female, former smoker quit in July 2020. PMH significant for COPD. Patient of Dr. Maple Hudson, last seen by pulmonary NP for virtual visit on 02/20/20. Maintained on Advair and Incruse.   01/07/2021- Interim hx  Patient contacted today for virtual visit/cough. She called our office today to get new nebulizer machine, states that it stopped working during breathing treatment last night. While on the phone she was noted to be coughing a lot. She was treated for acute bronchitis symptoms twice earlier this month with Augmentin and Doxycyline. She developed new cough three days ago. She was feeling well after completed recent course abx earlier this month until her whole family came down with viral illness/bronchitis symptoms. She has a dry cough with associated sob and wheezing. She is compliant with Advair and Incruse as prescribed. O2 91% on 2L. She is mostly bed bound d.t leg issues. She does get out of bed to use commode.    Observations/Objective:  - In hospital bed. Wearing 2L oxygen. Mild dry cough and dyspnea symptoms.   - CT chest in 2019 that showed stable 85mm pulmonary nodule x1 year consistent with benign etiology. Increasing reticulonodular pattern lateral LLL and lingular consistent with chronic post infectious or inflammatory process.    Assessment and Plan:  COPD exacerbation: - Recurrent exacerbations, she does improve with treatment. Developed new dry cough three days ago with associated sob and wheezing. She was  exposure to viral illness from her family. Sending in Zpack and prednisone taper. She needs new nebulizer machine as her current one is broken. Continue Advair and Incruse as prescribed. Advised she continue OTC Mucinex and will have her start using incentive spirometer DAILY. Consider adding Daliresp to help decrease frequency of her exacerbations at follow-up.  Follow Up Instructions:   -  Needs visit in office with Dr. Maple Hudson in the next three months or sooner if needed  I discussed the assessment and treatment plan with the patient. The patient was provided an opportunity to ask questions and all were answered. The patient agreed with the plan and demonstrated an understanding of the instructions.   The patient was advised to call back or seek an in-person evaluation if the symptoms worsen or if the condition fails to improve as anticipated.  I provided 25 minutes of non-face-to-face time during this encounter.   Glenford Bayley, NP

## 2021-01-12 ENCOUNTER — Other Ambulatory Visit: Payer: Self-pay | Admitting: Internal Medicine

## 2021-01-15 NOTE — Telephone Encounter (Signed)
Dr. Maple Hudson please advise on med refill. Reports show that pt quit smoking in 2020. Please advise if you believe she still needs to be on chantix.  Allergies  Allergen Reactions   Cephalexin Itching and Rash   Acetaminophen Nausea Only   Asa [Aspirin] Other (See Comments)   Hydrocodone-Acetaminophen Other (See Comments)    REACTION: itching   Morphine And Related Other (See Comments)   Sulfamethoxazole-Trimethoprim Rash     Current Outpatient Medications:    ADVAIR DISKUS 500-50 MCG/ACT AEPB, INHALE 1 PUFF INTO THE LUNGS TWICE DAILY, Disp: 60 each, Rfl: 4   albuterol (PROVENTIL) (2.5 MG/3ML) 0.083% nebulizer solution, USE 1 VIAL IN NEBULIZER EVERY 4 HOURS, Disp: 75 mL, Rfl: 11   albuterol (VENTOLIN HFA) 108 (90 Base) MCG/ACT inhaler, INHALE 2 PUFFS INTO THE LUNGS EVERY 6 HOURS AS NEEDED FOR WHEEZING OR SHORTNESS OF BREATH, Disp: 8.5 g, Rfl: 4   amLODipine (NORVASC) 10 MG tablet, TAKE 1 TABLET AT BEDTIME., Disp: 90 tablet, Rfl: 2   amLODipine (NORVASC) 10 MG tablet, Take by mouth., Disp: , Rfl:    azithromycin (ZITHROMAX) 250 MG tablet, Zpack taper as directed, Disp: 6 tablet, Rfl: 0   carvedilol (COREG) 3.125 MG tablet, Take 1 tablet (3.125 mg total) by mouth 2 (two) times daily with a meal., Disp: 60 tablet, Rfl: 3   ferrous sulfate 325 (65 FE) MG EC tablet, Take 1 tablet by mouth 2 (two) times daily., Disp: , Rfl:    fluticasone (FLONASE) 50 MCG/ACT nasal spray, INSTILL 1 SPRAY INTO EACH NOSTRIL TWICE DAILY, Disp: 16 g, Rfl: 2   furosemide (LASIX) 40 MG tablet, Take 1 tablet (40 mg total) by mouth daily as needed., Disp: 20 tablet, Rfl: 0   INCRUSE ELLIPTA 62.5 MCG/INH AEPB, INHALE 1 PUFF INTO THE LUNGS DAILY, Disp: 30 each, Rfl: 4   loratadine (CLARITIN) 10 MG tablet, Take 1 tablet (10 mg total) by mouth daily., Disp: 30 tablet, Rfl: 11   loratadine (CLARITIN) 10 MG tablet, Take by mouth., Disp: , Rfl:    methocarbamol (ROBAXIN) 750 MG tablet, Take 750 mg by mouth 3 (three) times daily.,  Disp: , Rfl:    montelukast (SINGULAIR) 10 MG tablet, TAKE 1 TABLET BY MOUTH AT BEDTIME., Disp: 30 tablet, Rfl: 12   montelukast (SINGULAIR) 10 MG tablet, Take by mouth., Disp: , Rfl:    Multiple Vitamin (MULTIVITAMIN) capsule, Take 1 capsule by mouth daily., Disp: , Rfl:    mupirocin ointment (BACTROBAN) 2 %, APPLY TO AFFECTED AREA(S) INTO THE NOSE 2 TIMES DAILY, Disp: 22 g, Rfl: 0   omeprazole (PRILOSEC) 20 MG capsule, Take 1 capsule (20 mg total) by mouth daily., Disp: 60 capsule, Rfl: 0   OVER THE COUNTER MEDICATION, Take 1 tablet by mouth every 12 (twelve) hours. mucus relief, Disp: , Rfl:    OVER THE COUNTER MEDICATION, Take by mouth every 6 (six) hours as needed. Store brand niquil, 1 tablespoon ever 6 hours., Disp: , Rfl:    oxyCODONE (OXYCONTIN) 20 mg 12 hr tablet, Take 10 mg by mouth. , Disp: , Rfl:    OXYGEN, Place 2 L/min into the nose as directed. Oxygen via [place] @@ 2L/min on exertion with stationary concentrator and portable gas tank., Disp: , Rfl:    potassium chloride SA (KLOR-CON) 20 MEQ tablet, Take by mouth., Disp: , Rfl:    predniSONE (DELTASONE) 10 MG tablet, Take 4 tabs po daily x 2 days; then 3 tabs for 2 days; then 2 tabs for  2 days; then 1 tab for 2 days, Disp: 20 tablet, Rfl: 0   TRAVATAN Z 0.004 % SOLN ophthalmic solution, Place 1 drop into both eyes at bedtime., Disp: , Rfl:    varenicline (CHANTIX) 1 MG tablet, TAKE 1 TABLET BY MOUTH TWICE DAILY, Disp: 60 tablet, Rfl: 2

## 2021-01-15 NOTE — Telephone Encounter (Signed)
Chantix refilled

## 2021-02-05 ENCOUNTER — Other Ambulatory Visit: Payer: Self-pay | Admitting: Internal Medicine

## 2021-02-05 DIAGNOSIS — J441 Chronic obstructive pulmonary disease with (acute) exacerbation: Secondary | ICD-10-CM

## 2021-02-12 ENCOUNTER — Other Ambulatory Visit: Payer: Self-pay

## 2021-02-12 ENCOUNTER — Other Ambulatory Visit: Payer: Self-pay | Admitting: Adult Health

## 2021-02-12 ENCOUNTER — Ambulatory Visit (INDEPENDENT_AMBULATORY_CARE_PROVIDER_SITE_OTHER): Payer: Medicare (Managed Care)

## 2021-02-12 DIAGNOSIS — Z0000 Encounter for general adult medical examination without abnormal findings: Secondary | ICD-10-CM | POA: Diagnosis not present

## 2021-02-12 DIAGNOSIS — K219 Gastro-esophageal reflux disease without esophagitis: Secondary | ICD-10-CM

## 2021-02-12 NOTE — Patient Instructions (Signed)
You spoke to [name], [place] over the phone for your annual wellness visit.  We discussed goals:   Goals      Exercise 3x per week     Patient reports having dumbbells.  Encouraged patient to use them 3x per week while watching TV.  Patient has orthopedic conditions that do not allow her to ambulate well.         We also discussed recommended health maintenance. As discussed, you are due for:  Health Maintenance  Topic Date Due   COVID-19 Vaccine (1) Never done   Pneumococcal Vaccine 58-22 Years old (1 - PCV) Never done   Hepatitis C Screening  Never done   COLONOSCOPY (Pts 45-70yr Insurance coverage will need to be confirmed)  Never done   PAP SMEAR-Modifier  05/16/2016   MAMMOGRAM  10/24/2016   Zoster Vaccines- Shingrix (1 of 2) Never done   INFLUENZA VACCINE  10/08/2020   TETANUS/TDAP  10/02/2027   HIV Screening  Completed   HPV VACCINES  Aged Out   PCP apt scheduled 03/20/2021 to discuss wheelchair.  Please consider health maintenance topics we discussed today. Start using your dumbbells ~3x per week.  Continue working on smoking cessation.   Preventive Care [age]Years Old, Female Preventive care refers to lifestyle choices and visits with your health care provider that can promote health and wellness. Preventive care visits are also called wellness exams. What can I expect for my preventive care visit? Counseling Your health care provider may ask you questions about your: Medical history, including: Past medical problems. Family medical history. Pregnancy history. Current health, including: Menstrual cycle. Method of birth control. Emotional well-being. Home life and relationship well-being. Sexual activity and sexual health. Lifestyle, including: Alcohol, nicotine or tobacco, and drug use. Access to firearms. Diet, exercise, and sleep habits. Work and work eStatistician Sunscreen use. Safety issues such as seatbelt and bike helmet use. Physical  exam Your health care provider will check your: Height and weight. These may be used to calculate your BMI (body mass index). BMI is a measurement that tells if you are at a healthy weight. Waist circumference. This measures the distance around your waistline. This measurement also tells if you are at a healthy weight and may help predict your risk of certain diseases, such as type 2 diabetes and high blood pressure. Heart rate and blood pressure. Body temperature. Skin for abnormal spots. What immunizations do I need? Vaccines are usually given at various ages, according to a schedule. Your health care provider will recommend vaccines for you based on your age, medical history, and lifestyle or other factors, such as travel or where you work. What tests do I need? Screening Your health care provider may recommend screening tests for certain conditions. This may include: Lipid and cholesterol levels. Diabetes screening. This is done by checking your blood sugar (glucose) after you have not eaten for a while (fasting). Pelvic exam and Pap test. Hepatitis B test. Hepatitis C test. HIV (human immunodeficiency virus) test. STI (sexually transmitted infection) testing, if you are at risk. Lung cancer screening. Colorectal cancer screening. Mammogram. Talk with your health care provider about when you should start having regular mammograms. This may depend on whether you have a family history of breast cancer. BRCA-related cancer screening. This may be done if you have a family history of breast, ovarian, tubal, or peritoneal cancers. Bone density scan. This is done to screen for osteoporosis. Talk with your health care provider about your test  results, treatment options, and if necessary, the need for more tests. Follow these instructions at home: Eating and drinking  Eat a diet that includes fresh fruits and vegetables, whole grains, lean protein, and low-fat dairy products. Take vitamin and  mineral supplements as recommended by your health care provider. Do not drink alcohol if: Your health care provider tells you not to drink. You are pregnant, may be pregnant, or are planning to become pregnant. If you drink alcohol: Limit how much you have to 0-1 drink a day. Know how much alcohol is in your drink. In the [place] one drink equals one 12 oz bottle of beer (355 mL), one 5 oz glass of wine (148 mL), or one 1 oz glass of hard liquor (44 mL). Lifestyle Brush your teeth every morning and night with fluoride toothpaste. Floss one time each day. Exercise for at least 30 minutes 5 or more days each week. Do not use any products that contain nicotine or tobacco. These products include cigarettes, chewing tobacco, and vaping devices, such as e-cigarettes. If you need help quitting, ask your health care provider. Do not use drugs. If you are sexually active, practice safe sex. Use a condom or other form of protection to prevent STIs. If you do not wish to become pregnant, use a form of birth control. If you plan to become pregnant, see your health care provider for a prepregnancy visit. Take aspirin only as told by your health care provider. Make sure that you understand how much to take and what form to take. Work with your health care provider to find out whether it is safe and beneficial for you to take aspirin daily. Find healthy ways to manage stress, such as: Meditation, yoga, or listening to music. Journaling. Talking to a trusted person. Spending time with friends and family. Minimize exposure to UV radiation to reduce your risk of skin cancer. Safety Always wear your seat belt while driving or riding in a vehicle. Do not drive: If you have been drinking alcohol. Do not ride with someone who has been drinking. When you are tired or distracted. While texting. If you have been using any mind-altering substances or drugs. Wear a helmet and other protective equipment during sports  activities. If you have firearms in your house, make sure you follow all gun safety procedures. Seek help if you have been physically or sexually abused. What's next? Visit your health care provider once a year for an annual wellness visit. Ask your health care provider how often you should have your eyes and teeth checked. Stay up to date on all vaccines. This information is not intended to replace advice given to you by your health care provider. Make sure you discuss any questions you have with your health care provider. Document Revised: 08/22/2020 Document Reviewed: 08/22/2020 Elsevier Patient Education  2022 [place] Prevention in the Home, Adult Falls can cause injuries and can happen to people of all ages. There are many things you can do to make your home safe and to help prevent falls. Ask for help when making these changes. What actions can I take to prevent falls? General Instructions Use good lighting in all rooms. Replace any light bulbs that burn out. Turn on the lights in dark areas. Use night-lights. Keep items that you use often in easy-to-reach places. Lower the shelves around your home if needed. Set up your furniture so you have a clear path. Avoid moving your furniture around. Do not have throw  rugs or other things on the floor that can make you trip. Avoid walking on wet floors. If any of your floors are uneven, fix them. Add color or contrast paint or tape to clearly mark and help you see: Grab bars or handrails. First and last steps of staircases. Where the edge of each step is. If you use a stepladder: Make sure that it is fully opened. Do not climb a closed stepladder. Make sure the sides of the stepladder are locked in place. Ask someone to hold the stepladder while you use it. Know where your pets are when moving through your home. What can I do in the bathroom?   Keep the floor dry. Clean up any water on the floor right away. Remove soap  buildup in the tub or shower. Use nonskid mats or decals on the floor of the tub or shower. Attach bath mats securely with double-sided, nonslip rug tape. If you need to sit down in the shower, use a plastic, nonslip stool. Install grab bars by the toilet and in the tub and shower. Do not use towel bars as grab bars. What can I do in the bedroom? Make sure that you have a light by your bed that is easy to reach. Do not use any sheets or blankets for your bed that hang to the floor. Have a firm chair with side arms that you can use for support when you get dressed. What can I do in the kitchen? Clean up any spills right away. If you need to reach something above you, use a step stool with a grab bar. Keep electrical cords out of the way. Do not use floor polish or wax that makes floors slippery. What can I do with my stairs? Do not leave any items on the stairs. Make sure that you have a light switch at the top and the bottom of the stairs. Make sure that there are handrails on both sides of the stairs. Fix handrails that are broken or loose. Install nonslip stair treads on all your stairs. Avoid having throw rugs at the top or bottom of the stairs. Choose a carpet that does not hide the edge of the steps on the stairs. Check carpeting to make sure that it is firmly attached to the stairs. Fix carpet that is loose or worn. What can I do on the outside of my home? Use bright outdoor lighting. Fix the edges of walkways and driveways and fix any cracks. Remove anything that might make you trip as you walk through a door, such as a raised step or threshold. Trim any bushes or trees on paths to your home. Check to see if handrails are loose or broken and that both sides of all steps have handrails. Install guardrails along the edges of any raised decks and porches. Clear paths of anything that can make you trip, such as tools or rocks. Have leaves, snow, or ice cleared regularly. Use sand or  salt on paths during winter. Clean up any spills in your garage right away. This includes grease or oil spills. What other actions can I take? Wear shoes that: Have a low heel. Do not wear high heels. Have rubber bottoms. Feel good on your feet and fit well. Are closed at the toe. Do not wear open-toe sandals. Use tools that help you move around if needed. These include: Canes. Walkers. Scooters. Crutches. Review your medicines with your doctor. Some medicines can make you feel dizzy. This can increase your  chance of falling. Ask your doctor what else you can do to help prevent falls. Where to find more information Centers for Disease Control and Prevention, [employer]: http://www.wolf.info/ National Institute on Aging: http://kim-miller.com/ Contact a doctor if: You are afraid of falling at home. You feel weak, drowsy, or dizzy at home. You fall at home. Summary There are many simple things that you can do to make your home safe and to help prevent falls. Ways to make your home safe include removing things that can make you trip and installing grab bars in the bathroom. Ask for help when making these changes in your home. This information is not intended to replace advice given to you by your health care provider. Make sure you discuss any questions you have with your health care provider. Document Revised: 09/28/2019 Document Reviewed: 09/28/2019 Elsevier Patient Education  2022 [employer].   Our clinic's number is [phone]. Please call with questions or concerns about what we discussed today.

## 2021-02-12 NOTE — Progress Notes (Signed)
I have reviewed this visit and agree with the documentation.   

## 2021-02-12 NOTE — Progress Notes (Signed)
Subjective:   [name] is a 55 y.o. female who presents for Medicare Annual (Subsequent) preventive examination.  Patient consented to have virtual visit and was identified by name and date of birth. Method of visit: Telephone  Encounter participants: Patient: [name] - located at Home Nurse/Provider: Steva Colder - located at [facility] Others (if applicable): NA  Review of Systems: Defer to PCP.  Cardiac Risk Factors include: smoking/ tobacco exposure;sedentary lifestyle;hypertension  Objective:  LMP: post menopausal   Advanced Directives 02/12/2021 11/28/2017 09/07/2017 04/25/2016 04/25/2016 04/14/2014  Does Patient Have a Medical Advance Directive? No No No Yes No No  Does patient want to make changes to medical advance directive? - - - No - Patient declined - -  Would patient like information on creating a medical advance directive? Yes (MAU/Ambulatory/Procedural Areas - Information given) - No - Patient declined - No - Patient declined No - patient declined information   Tobacco Social History   Tobacco Use  Smoking Status Every Day   Packs/day: 0.50   Years: 35.00   Pack years: 17.50   Types: Cigarettes   Last attempt to quit: 10/04/2018   Years since quitting: 2.3  Smokeless Tobacco Never  Tobacco Comments   Pt reports smoking ~7 cigs per day.    Pt reports using Chantix     Ready to quit- Yes- patient uses Chantix daily.  Counseling given: Yes- education provided.  Clinical Intake:  Pre-visit preparation completed: Yes  How often do you need to have someone help you when you read instructions, pamphlets, or other written materials from your doctor or pharmacy?: 2 - Rarely What is the last grade level you completed in school?: High School  Interpreter Needed?: No  Past Medical History:  Diagnosis Date   Anxiety    COPD (chronic obstructive pulmonary disease) (HCC)    Depression    GERD (gastroesophageal reflux disease)    History of alcoholism (HCC)     Hypertension    Oxygen deficiency    Past Surgical History:  Procedure Laterality Date   APPENDECTOMY     CESAREAN SECTION     CHOLECYSTECTOMY     HERNIA REPAIR     MINOR CARPAL TUNNEL     right hip surgery     Family History  Problem Relation Age of Onset   Heart disease Mother    Cancer Mother    Hypertension Mother    Hypertension Father    Autism Son    Social History   Socioeconomic History   Marital status: Married    Spouse name: [name]   Number of children: 2   Years of education: 12   Highest education level: High school graduate  Occupational History   Not on file  Tobacco Use   Smoking status: Every Day    Packs/day: 0.50    Years: 35.00    Pack years: 17.50    Types: Cigarettes    Last attempt to quit: 10/04/2018    Years since quitting: 2.3   Smokeless tobacco: Never   Tobacco comments:    Pt reports smoking ~7 cigs per day.     Pt reports using Chantix  Vaping Use   Vaping Use: Never used  Substance and Sexual Activity   Alcohol use: No    Alcohol/week: 0.0 standard drinks    Comment: History of Alcoholism   Drug use: Yes    Types: Marijuana   Sexual activity: Yes    Birth control/protection: Post-menopausal,  Surgical  Other Topics Concern   Not on file  Social History Narrative   Patient lives in [place] with her (2) sons and husband.    Patient is disabled.    Patient has two sons. One with Austim and one with a TBI.   Patient enjoys spending time with her family and two dogs.    Social Determinants of Health   Financial Resource Strain: Low Risk    Difficulty of Paying Living Expenses: Not very hard  Food Insecurity: No Food Insecurity   Worried About Programme researcher, broadcasting/film/video in the Last Year: Never true   Ran Out of Food in the Last Year: Never true  Transportation Needs: No Transportation Needs   Lack of Transportation (Medical): No   Lack of Transportation (Non-Medical): No  Physical Activity: Inactive   Days of Exercise per Week:  0 days   Minutes of Exercise per Session: 0 min  Stress: Stress Concern Present   Feeling of Stress : To some extent  Social Connections: Moderately Isolated   Frequency of Communication with Friends and Family: Three times a week   Frequency of Social Gatherings with Friends and Family: More than three times a week   Attends Religious Services: Never   Database administrator or Organizations: No   Attends Banker Meetings: Never   Marital Status: Married   Outpatient Encounter Medications as of 02/12/2021  Medication Sig   ADVAIR DISKUS 500-50 MCG/ACT AEPB INHALE 1 PUFF INTO THE LUNGS TWICE DAILY   albuterol (PROVENTIL) (2.5 MG/3ML) 0.083% nebulizer solution USE 1 VIAL IN NEBULIZER EVERY 4 HOURS   albuterol (VENTOLIN HFA) 108 (90 Base) MCG/ACT inhaler INHALE 2 PUFFS INTO THE LUNGS EVERY 6 HOURS AS NEEDED FOR WHEEZING OR SHORTNESS OF BREATH   amLODipine (NORVASC) 10 MG tablet TAKE 1 TABLET AT BEDTIME.   amLODipine (NORVASC) 10 MG tablet Take by mouth.   aspirin 81 MG chewable tablet Chew by mouth daily.   carvedilol (COREG) 3.125 MG tablet Take 1 tablet (3.125 mg total) by mouth 2 (two) times daily with a meal.   fluticasone (FLONASE) 50 MCG/ACT nasal spray INSTILL 1 SPRAY INTO EACH NOSTRIL TWICE DAILY   furosemide (LASIX) 40 MG tablet Take 1 tablet (40 mg total) by mouth daily as needed.   INCRUSE ELLIPTA 62.5 MCG/INH AEPB INHALE 1 PUFF INTO THE LUNGS DAILY   loratadine (CLARITIN) 10 MG tablet Take 1 tablet (10 mg total) by mouth daily.   loratadine (CLARITIN) 10 MG tablet Take by mouth.   methocarbamol (ROBAXIN) 750 MG tablet Take 750 mg by mouth 3 (three) times daily.   montelukast (SINGULAIR) 10 MG tablet Take by mouth.   montelukast (SINGULAIR) 10 MG tablet TAKE 1 TABLET BY MOUTH AT BEDTIME.   Multiple Vitamin (MULTIVITAMIN) capsule Take 1 capsule by mouth daily.   mupirocin ointment (BACTROBAN) 2 % APPLY TO AFFECTED AREA(S) INTO THE NOSE 2 TIMES DAILY   omeprazole  (PRILOSEC) 20 MG capsule Take 1 capsule (20 mg total) by mouth daily.   oxyCODONE (OXYCONTIN) 20 mg 12 hr tablet Take 10 mg by mouth.    OXYGEN Place 2 L/min into the nose as directed. Oxygen via [place] @@ 2L/min on exertion with stationary concentrator and portable gas tank.   TRAVATAN Z 0.004 % SOLN ophthalmic solution Place 1 drop into both eyes at bedtime.   varenicline (CHANTIX) 1 MG tablet TAKE 1 TABLET BY MOUTH TWICE DAILY   azithromycin (ZITHROMAX) 250 MG tablet Zpack taper  as directed (Patient not taking: Reported on 02/12/2021)   ferrous sulfate 325 (65 FE) MG EC tablet Take 1 tablet by mouth 2 (two) times daily.   OVER THE COUNTER MEDICATION Take 1 tablet by mouth every 12 (twelve) hours. mucus relief (Patient not taking: Reported on 02/12/2021)   OVER THE COUNTER MEDICATION Take by mouth every 6 (six) hours as needed. Store brand niquil, 1 tablespoon ever 6 hours. (Patient not taking: Reported on 02/12/2021)   potassium chloride SA (KLOR-CON) 20 MEQ tablet Take by mouth. (Patient not taking: Reported on 02/12/2021)   predniSONE (DELTASONE) 10 MG tablet Take 4 tabs po daily x 2 days; then 3 tabs for 2 days; then 2 tabs for 2 days; then 1 tab for 2 days (Patient not taking: Reported on 02/12/2021)   No facility-administered encounter medications on file as of 02/12/2021.   Activities of Daily Living In your present state of health, do you have any difficulty performing the following activities: 02/12/2021  Hearing? N  Vision? N  Difficulty concentrating or making decisions? N  Walking or climbing stairs? Y  Dressing or bathing? N  Doing errands, shopping? N  Preparing Food and eating ? N  Using the Toilet? Y  Comment uses bedside commode  In the past six months, have you accidently leaked urine? N  Do you have problems with loss of bowel control? N  Managing your Medications? N  Managing your Finances? N  Housekeeping or managing your Housekeeping? N  Some recent data might be hidden    Patient Care Team: Evelena Leyden, DO as PCP - General (Family Medicine) Waymon Budge, MD as Consulting Physician (Pulmonary Disease)    Assessment:   This is a routine wellness examination for [place].  Exercise Activities and Dietary recommendations Current Exercise Habits: The patient does not participate in regular exercise at present, Exercise limited by: orthopedic condition(s);psychological condition(s);respiratory conditions(s)   Goals      Exercise 3x per week     Patient reports having dumbbells.  Encouraged patient to use them 3x per week while watching TV.  Patient has orthopedic conditions that do not allow her to ambulate well.        Fall Risk Fall Risk  02/12/2021  Falls in the past year? 0  Number falls in past yr: 0  Injury with Fall? 0  Risk for fall due to : Impaired balance/gait;Orthopedic patient  Follow up Falls prevention discussed   Patient reports extensive history of hip surgeries. Patient dose use assistive devices to ambulate.   Is the patient's home free of loose throw rugs in walkways, pet beds, electrical cords, etc?   yes      Grab bars in the bathroom? yes      Handrails on the stairs?   yes      Adequate lighting?   yes  Depression Screen PHQ 2/9 Scores 02/12/2021 09/07/2017 04/25/2016 04/14/2014  PHQ - 2 Score 0 0 0 2    Cognitive Function 6CIT Screen 02/12/2021  What Year? 0 points  What month? 0 points  What time? 0 points  Count back from 20 0 points  Months in reverse 0 points  Repeat phrase 0 points  Total Score 0   Immunization History  Administered Date(s) Administered   Influenza Whole 01/26/2007   Influenza,inj,Quad PF,6+ Mos 11/26/2012, 12/19/2013, 12/24/2014, 02/13/2018, 12/06/2018   Influenza-Unspecified 01/09/2015, 12/04/2015   Td 04/10/2006   Tdap 10/01/2017    Flu Vaccine status: Due, Education has been provided  regarding the importance of this vaccine. Advised may receive this vaccine at local pharmacy or  Health Dept. Aware to provide a copy of the vaccination record if obtained from local pharmacy or Health Dept. Verbalized acceptance and understanding.   Pneumococcal vaccine status: Due, Education has been provided regarding the importance of this vaccine. Advised may receive this vaccine at local pharmacy or Health Dept. Aware to provide a copy of the vaccination record if obtained from local pharmacy or Health Dept. Verbalized acceptance and understanding.   Covid-19 vaccine status: Patient declines vaccines. Education has been provided regarding the importance of this vaccine. Advised may receive this vaccine at local pharmacy or Health Dept. Aware to provide a copy of the vaccination record if obtained from local pharmacy or Health Dept. Verbalized acceptance and understanding.  Screening Tests Health Maintenance  Topic Date Due   COVID-19 Vaccine (1) Never done   Pneumococcal Vaccine 30-12 Years old (1 - PCV) Never done   Hepatitis C Screening  Never done   COLONOSCOPY (Pts 45-77yrs Insurance coverage will need to be confirmed)  Never done   PAP SMEAR-Modifier  05/16/2016   MAMMOGRAM  10/24/2016   Zoster Vaccines- Shingrix (1 of 2) Never done   INFLUENZA VACCINE  10/08/2020   TETANUS/TDAP  10/02/2027   HIV Screening  Completed   HPV VACCINES  Aged Out   Qualifies for Shingles Vaccine? Yes   Shingrix Completed: No, Education has been provided regarding the importance of this vaccine. Advised may receive this vaccine at local pharmacy or Health Dept. Aware to provide a copy of the vaccination record if obtained from local pharmacy or Health Dept. Verbalized acceptance and understanding.   Cancer Screenings: Lung: Low Dose CT Chest recommended if Age 68-80 years, 30 pack-year currently smoking OR have quit w/in 15years. Patient does qualify. Last done in 2019- 1 year FU with reccommended.  Breast:  Up to date on Mammogram? No   Up to date of Bone Density/Dexa? No Colorectal:  Never  Additional Screenings: HIV Screening: Completed   Plan:  PCP apt scheduled 03/20/2021 to discuss wheelchair.  Please consider health maintenance topics we discussed today. Start using your dumbbells ~3x per week.  Continue working on smoking cessation.   I have personally reviewed and noted the following in the patient's chart:   Medical and social history Use of alcohol, tobacco or illicit drugs  Current medications and supplements Functional ability and status Nutritional status Physical activity Advanced directives List of other physicians Hospitalizations, surgeries, and ER visits in previous 12 months Vitals Screenings to include cognitive, depression, and falls Referrals and appointments  In addition, I have reviewed and discussed with patient certain preventive protocols, quality metrics, and best practice recommendations. A written personalized care plan for preventive services as well as general preventive health recommendations were provided to patient.  This visit was conducted virtually in the setting of the COVID19 pandemic.    Steva Colder, CMA  02/12/2021

## 2021-02-13 MED ORDER — OMEPRAZOLE 20 MG PO CPDR: 20.0000 mg | DELAYED_RELEASE_CAPSULE | Freq: Every day | ORAL | 2 refills | Status: DC

## 2021-02-27 ENCOUNTER — Telehealth: Payer: Self-pay | Admitting: Internal Medicine

## 2021-02-27 NOTE — Telephone Encounter (Signed)
Spoke with pt  She states recently txed for aspiration pna at [facility] and is not improving p abx  Declined in person ov  Scheduled for video visit with CDY for 4 pm tomorrow

## 2021-02-28 ENCOUNTER — Telehealth (INDEPENDENT_AMBULATORY_CARE_PROVIDER_SITE_OTHER): Payer: Medicare (Managed Care) | Admitting: Internal Medicine

## 2021-02-28 ENCOUNTER — Other Ambulatory Visit: Payer: Self-pay

## 2021-02-28 ENCOUNTER — Encounter: Payer: Self-pay | Admitting: Internal Medicine

## 2021-02-28 DIAGNOSIS — J9611 Chronic respiratory failure with hypoxia: Secondary | ICD-10-CM

## 2021-02-28 DIAGNOSIS — J441 Chronic obstructive pulmonary disease with (acute) exacerbation: Secondary | ICD-10-CM

## 2021-02-28 DIAGNOSIS — J690 Pneumonitis due to inhalation of food and vomit: Secondary | ICD-10-CM

## 2021-02-28 MED ORDER — AMOXICILLIN-POT CLAVULANATE 875-125 MG PO TABS: 1.0000 | ORAL_TABLET | Freq: Two times a day (BID) | ORAL | 0 refills | Status: DC

## 2021-02-28 NOTE — Progress Notes (Signed)
HPI female one pack per day smoker followed for COPD mixed type, Chronic Respiratory Failure Hypoxic,, lung nodules, seasonal allergic rhinitis, tobacco abuse, complicated by hip surgery/infection/repair, anxiety/depression, GERD, HBP, obesity, Husband is a patient here ( blind and on dialysis)  Allergy skin test- significant positives for grass weed and tree pollens, dust mite, cockroach. Office Spirometry-08/19/2013-severe obstructive airways disease. FVC 2.05/71%, FEV1 1.04/42%, FEV1/FVC 0.51, FEF 25-75%  0.50/17% Office spirometry 07/06/14-severe obstructive airways disease. FVC 2.37/79%, FEV1 0.92/37%, FEV1/FVC 0.39, FEF 25-75 percent 0.26/9%  -----------------------------------------------------------------------------------------   10/11/2018- Virtual Visit via Telephone Note  I connected with [name] on 10/11/18 at  2:00 PM EDT by telephone and verified that I am speaking with the correct person using two identifiers.  Location: Patient: H Provider: O   I discussed the limitations, risks, security and privacy concerns of performing an evaluation and management service by telephone and the availability of in person appointments. I also discussed with the patient that there may be a patient responsible charge related to this service. The patient expressed understanding and agreed to proceed.   History of Present Illness: 55 year old female one pack per day smoker followed for COPD mixed type, lung nodules, Chronic resp failure hypoxic, seasonal allergic rhinitis, tobacco abuse, complicated by hip surgery/infection/repair, anxiety/depression, GERD, HBP, obesity, O2 2 L continuous  Incruse, Singulair, Claritin,  Advair 500, Chantix, albuterol hfa, neb albuterol,  -----(see telephone note 07/31) pt states she is doing better since augmentin, OTC cough medication, and breathing treatments; reports cough w/ white mucus Says quit smoking "1 weeks ago"- strongly supported in our  discussion. Latest hip surgery 02/10/18- repeated complications. She doesn't intend to allow any more hips surgery. Has Home Health RN. We will ask her to get O2 sat at rest on room air to qualify for O2.  Home oximetry usually 95-98% on O2 2L.  Needs new O2 supplier. Wants Inogen 1 machine at 2L.  Hosp this Spring with pneumonia. Sputum now white as she finishes course of augmentin we called in 7/31.  Observations/Objective:   Assessment and Plan: Chronic resp failure hypoxic- HHRN to get O2 sat rest room air to qualify for Inogen POC COPD mixed type- chronic smoker. Meds sufficient if she can stay off cigs.  Tobacco- supporting cessation Follow Up Instructions: 6 months  I discussed the assessment and treatment plan with the patient. The patient was provided an opportunity to ask questions and all were answered. The patient agreed with the plan and demonstrated an understanding of the instructions.   The patient was advised to call back or seek an in-person evaluation if the symptoms worsen or if the condition fails to improve as anticipated.  I provided 21 minutes of non-face-to-face time during this encounter.   Jetty Duhamel, MD  02/28/21-Virtual Visit via Video Note  I connected with [name] on 02/28/21 at  4:00 PM EST by a video enabled telemedicine application and verified that I am speaking with the correct person using two identifiers.  Location: Patient: home Provider: office   I discussed the limitations of evaluation and management by telemedicine and the availability of in person appointments. The patient expressed understanding and agreed to proceed.  History of Present Illness: 55 year old female Smoker followed for COPD mixed type, lung nodules, Chronic resp failure hypoxic, seasonal allergic rhinitis, tobacco abuse, complicated by hip surgery/infection/repair, anxiety/depression, GERD, HBP, obesity, O2 2-3 L continuous  Incruse, Singulair, Claritin,  Advair  500, Chantix, albuterol hfa, neb albuterol,  Latest hip surgery 02/10/18-  repeated complications. She doesn't intend to allow any more hip surgery. Admits to 2 cigarettes/day and using Chantix. Choked on a piece of egg biscuit and evaluated for aspiration pneumonia at [facility].  She says she eventually coughed out the piece of biscuit.  CT chest was done.  Treated with Flagyl, Levaquin and prednisone. Says her usual oxygen saturation on 3 L is about 94% but this morning 90%.  She has a nebulizer machine and a flutter device.  Still coughing up some white phlegm. She expressed concern that she might need to extend antibiotic longer.   Observations/Objective: Light cough evident during video conversation  Assessment and Plan: Aspiration pneumonia COPD Chronic respiratory failure with hypoxia  Follow Up Instructions: Augmentin, CXR   I discussed the assessment and treatment plan with the patient. The patient was provided an opportunity to ask questions and all were answered. The patient agreed with the plan and demonstrated an understanding of the instructions.   The patient was advised to call back or seek an in-person evaluation if the symptoms worsen or if the condition fails to improve as anticipated.  I provided 20 minutes of non-face-to-face time during this encounter.   Jetty Duhamel, MD   -------------------------------------  ROS-see HPI     + = positive Constitutional:   No-   weight loss, night sweats, fevers, chills, fatigue, lassitude. HEENT:   No-  headaches, difficulty swallowing, tooth/dental problems, no-sore throat,       No- sneezing, no-itching, ear ache, +nasal congestion,+ post nasal drip,  CV:  No-   chest pain, orthopnea, PND,+ swelling in lower extremities, no-anasarca,                                                       dizziness, palpitations Resp: + shortness of breath with exertion or at rest.             + productive cough, + non-productive  cough,  No- coughing up of blood.              No-   change in color of mucus.  No- wheezing.   Skin: No-   rash or lesions. GI:  + heartburn, indigestion, No-abdominal pain, nausea, vomiting,  GU:  MS:  + joint pain or swelling.   Neuro-     nothing unusual Psych:  No- change in mood or affect. + depression or anxiety.  No memory loss.  OBJ- Physical Exam General- Alert, Oriented, Affect-appropriate, Distress- none acute. + Overweight, + talkative + wheelchair,                                     +O2 Skin- rash-none, lesions- none, excoriation- none Lymphadenopathy- none Head- atraumatic            Eyes- Gross vision intact, PERRLA, conjunctivae and secretions clear            Ears- Hearing, canals-normal            Nose- + turbinate edema, no-Septal dev, +mucus bridging, polyps, erosion, perforation             Throat- Mallampati III , mucosa clear , drainage- none, tonsils- atrophic, + dentures,  Neck- flexible , trachea midline, no stridor , thyroid  nl, carotid no bruit Chest - symmetrical excursion , unlabored           Heart/CV- RRR , no murmur , no gallop  , no rub, nl s1 s2                           - JVD- none , edema- none, stasis changes- none, varices- none           Lung-  wheeze + coarse/unlabored, cough+ upper airway rattle,  dullness-none, rub- none           Chest wall-  Abd-  Br/ Gen/ Rectal- Not done, not indicated Extrem- cyanosis- none, clubbing, none, atrophy- none, strength- nl, + wheelchair Neuro- grossly intact to observation

## 2021-02-28 NOTE — Patient Instructions (Addendum)
Order- future outpatient CXR next week at [facility]-  aspiration pneumonia  Script for augmentin sent  Keep using nebulizer machine and Flutter to clear your airways  Ok to use oxygen 2-5 liters as needed to keep O2 saturation 88% or higher most of the time  Please call if we can help

## 2021-03-12 ENCOUNTER — Other Ambulatory Visit: Payer: Self-pay | Admitting: Internal Medicine

## 2021-03-20 ENCOUNTER — Ambulatory Visit: Payer: Medicare (Managed Care) | Admitting: Family Medicine

## 2021-03-21 ENCOUNTER — Encounter: Payer: Self-pay | Admitting: Internal Medicine

## 2021-03-21 DIAGNOSIS — J690 Pneumonitis due to inhalation of food and vomit: Secondary | ICD-10-CM | POA: Insufficient documentation

## 2021-03-21 NOTE — Assessment & Plan Note (Signed)
She remains long-term oxygen dependent Plan-continue oxygen 2-3 L

## 2021-03-21 NOTE — Assessment & Plan Note (Signed)
She is to continue steady use of Advair 500, with albuterol rescue inhaler or nebulizer as needed

## 2021-03-21 NOTE — Assessment & Plan Note (Signed)
I do not have imaging report.  Foreign body aspiration-biscuit.  She thinks she coughs it all out. Plan-Augmentin, follow-up CXR at [facility]

## 2021-04-03 ENCOUNTER — Ambulatory Visit: Payer: Medicare (Managed Care) | Admitting: Family Medicine

## 2021-04-05 ENCOUNTER — Other Ambulatory Visit: Payer: Self-pay

## 2021-04-05 DIAGNOSIS — I10 Essential (primary) hypertension: Secondary | ICD-10-CM

## 2021-04-05 MED ORDER — CARVEDILOL 3.125 MG PO TABS: 3.1250 mg | ORAL_TABLET | Freq: Two times a day (BID) | ORAL | 3 refills | Status: DC

## 2021-04-10 ENCOUNTER — Ambulatory Visit: Payer: Medicare (Managed Care) | Admitting: Family Medicine

## 2021-04-15 ENCOUNTER — Telehealth: Payer: Self-pay | Admitting: Internal Medicine

## 2021-04-15 NOTE — Telephone Encounter (Signed)
Lm for patient.  Did not leave detailed message-no DPR on file.

## 2021-04-24 ENCOUNTER — Ambulatory Visit: Payer: Medicare (Managed Care) | Admitting: Family Medicine

## 2021-04-30 ENCOUNTER — Telehealth: Payer: Self-pay | Admitting: Internal Medicine

## 2021-04-30 MED ORDER — AMOXICILLIN-POT CLAVULANATE 875-125 MG PO TABS: 1.0000 | ORAL_TABLET | Freq: Two times a day (BID) | ORAL | 0 refills | Status: DC

## 2021-04-30 NOTE — Telephone Encounter (Signed)
Spoke to patient.  She is concerned that she has developed PNA.  C/o prod cough with yellow sputum, wheezing, temp of 101.7, chest/nasal congestion and increased sob with exertion x3d  Spo2 88-89% on 3L.  Negative home covid test yesterday. She is using albuterol neb 4-6x daily, Advair BID and Incruse once daily. Not vaccinated against covid or flu.  Declined appt.   Dr. Maple Hudson, please advise.    Current Outpatient Medications on File Prior to Visit  Medication Sig Dispense Refill   ADVAIR DISKUS 500-50 MCG/ACT AEPB INHALE 1 PUFF INTO THE LUNGS TWICE DAILY 60 each 3   albuterol (PROVENTIL) (2.5 MG/3ML) 0.083% nebulizer solution USE 1 VIAL IN NEBULIZER EVERY 4 HOURS 75 mL 11   albuterol (VENTOLIN HFA) 108 (90 Base) MCG/ACT inhaler INHALE 2 PUFFS INTO THE LUNGS EVERY 6 HOURS AS NEEDED FOR WHEEZING OR SHORTNESS OF BREATH 8.5 g 3   amLODipine (NORVASC) 10 MG tablet TAKE 1 TABLET AT BEDTIME. 90 tablet 2   amLODipine (NORVASC) 10 MG tablet Take by mouth.     amoxicillin-clavulanate (AUGMENTIN) 875-125 MG tablet Take 1 tablet by mouth 2 (two) times daily. 14 tablet 0   aspirin 81 MG chewable tablet Chew by mouth daily.     azithromycin (ZITHROMAX) 250 MG tablet Zpack taper as directed 6 tablet 0   carvedilol (COREG) 3.125 MG tablet Take 1 tablet (3.125 mg total) by mouth 2 (two) times daily with a meal. 60 tablet 3   ferrous sulfate 325 (65 FE) MG EC tablet Take 1 tablet by mouth 2 (two) times daily.     fluticasone (FLONASE) 50 MCG/ACT nasal spray INSTILL 1 SPRAY INTO EACH NOSTRIL TWICE DAILY 16 g 2   furosemide (LASIX) 40 MG tablet Take 1 tablet (40 mg total) by mouth daily as needed. 20 tablet 0   INCRUSE ELLIPTA 62.5 MCG/ACT AEPB INHALE 1 PUFF INTO THE LUNGS DAILY 30 each 3   loratadine (CLARITIN) 10 MG tablet Take 1 tablet (10 mg total) by mouth daily. 30 tablet 11   loratadine (CLARITIN) 10 MG tablet Take by mouth.     methocarbamol (ROBAXIN) 750 MG tablet Take 750 mg by mouth 3 (three)  times daily.     montelukast (SINGULAIR) 10 MG tablet Take by mouth.     montelukast (SINGULAIR) 10 MG tablet TAKE 1 TABLET BY MOUTH AT BEDTIME. 30 tablet 11   Multiple Vitamin (MULTIVITAMIN) capsule Take 1 capsule by mouth daily.     mupirocin ointment (BACTROBAN) 2 % APPLY TO AFFECTED AREA(S) INTO THE NOSE 2 TIMES DAILY 22 g 0   omeprazole (PRILOSEC) 20 MG capsule Take 1 capsule (20 mg total) by mouth daily. 90 capsule 2   OVER THE COUNTER MEDICATION Take 1 tablet by mouth every 12 (twelve) hours. mucus relief     OVER THE COUNTER MEDICATION Take by mouth every 6 (six) hours as needed. Store brand niquil, 1 tablespoon ever 6 hours.     oxyCODONE (OXYCONTIN) 20 mg 12 hr tablet Take 10 mg by mouth.      OXYGEN Place 2 L/min into the nose as directed. Oxygen via [place] @@ 2L/min on exertion with stationary concentrator and portable gas tank.     potassium chloride SA (KLOR-CON) 20 MEQ tablet Take by mouth.     predniSONE (DELTASONE) 10 MG tablet Take 4 tabs po daily x 2 days; then 3 tabs for 2 days; then 2 tabs for 2 days; then 1 tab for 2 days 20 tablet 0  TRAVATAN Z 0.004 % SOLN ophthalmic solution Place 1 drop into both eyes at bedtime.     varenicline (CHANTIX) 1 MG tablet TAKE 1 TABLET BY MOUTH TWICE DAILY 60 tablet 3   No current facility-administered medications on file prior to visit.    Allergies  Allergen Reactions   Garlic Anaphylaxis   Onion Anaphylaxis   Cephalexin Itching and Rash   Acetaminophen Nausea Only   Asa [Aspirin] Other (See Comments)   Hydrocodone-Acetaminophen Other (See Comments)    REACTION: itching   Morphine And Related Other (See Comments)   Sulfamethoxazole-Trimethoprim Rash

## 2021-04-30 NOTE — Telephone Encounter (Signed)
Patient is aware of recommendations and voiced her understanding.  Augmentin 875 has been sent to preferred pharmacy.  Nothing further needed.

## 2021-04-30 NOTE — Telephone Encounter (Signed)
Augmentin 875 mg, # 14, 1 twice daily.  If not improving go to Urgent Care.

## 2021-05-02 NOTE — Telephone Encounter (Signed)
Albuterol already sent to pharmacy with refills.

## 2021-05-09 ENCOUNTER — Other Ambulatory Visit: Payer: Self-pay

## 2021-05-09 ENCOUNTER — Encounter: Payer: Self-pay | Admitting: Family Medicine

## 2021-05-09 ENCOUNTER — Ambulatory Visit (INDEPENDENT_AMBULATORY_CARE_PROVIDER_SITE_OTHER): Payer: Medicare (Managed Care) | Admitting: Family Medicine

## 2021-05-09 VITALS — BP 140/96

## 2021-05-09 DIAGNOSIS — Z96641 Presence of right artificial hip joint: Secondary | ICD-10-CM | POA: Diagnosis not present

## 2021-05-09 DIAGNOSIS — I10 Essential (primary) hypertension: Secondary | ICD-10-CM

## 2021-05-09 DIAGNOSIS — Z89621 Acquired absence of right hip joint: Secondary | ICD-10-CM | POA: Diagnosis not present

## 2021-05-09 DIAGNOSIS — Z8673 Personal history of transient ischemic attack (TIA), and cerebral infarction without residual deficits: Secondary | ICD-10-CM | POA: Diagnosis not present

## 2021-05-09 DIAGNOSIS — M25551 Pain in right hip: Secondary | ICD-10-CM

## 2021-05-09 MED ORDER — MUPIROCIN 2 % EX OINT: TOPICAL_OINTMENT | CUTANEOUS | 0 refills | Status: DC

## 2021-05-09 MED ORDER — FUROSEMIDE 40 MG PO TABS: 40.0000 mg | ORAL_TABLET | Freq: Every day | ORAL | 0 refills | Status: DC | PRN

## 2021-05-09 NOTE — Progress Notes (Signed)
? ? ?  SUBJECTIVE:  ? ?CHIEF COMPLAINT / HPI:  ? ?Patient presents to discuss wheelchair.  She is a IT trainer wheelchair as it is difficult for her to get around her house as the wheelchair she has does not fit well through the doors and it makes her feel like she is trapped. ? ?Patient also notes that she recently had a respiratory infection/pneumonia and was given Augmentin.  She finished her last dose yesterday morning.  She does note that she is still having a lot of coughing and sometimes her oxygen will go a little bit low but overall she has been averaging oxygen saturations at 90% on her home oxygen (2-2.5 L).  She has been using her nebulizers and inhalers as prescribed and is also using spirometer and flutter valve consistently. ? ?Patient is also needing refill of her Lasix and mupirocin. ? ? ?PERTINENT  PMH / PSH: Reviewed ? ?OBJECTIVE:  ? ?BP (!) 140/96   LMP 04/08/2014   SpO2 (!) 88%   ?Gen: well-appearing, NAD ?CV: RRR, no m/r/g appreciated, no peripheral edema ?Pulm: overall adequate aeration, no focality to exam, some rhonchi present diffusely, saturating 88% with O2 [place] in place ?GI: soft, non-tender, non-distended ? ?ASSESSMENT/PLAN:  ? 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 ?- Referral to neuro rehab placed for evaluation ? ?Recent upper respiratory illness ?Patient is still having cough and congestion, she is adherent to her regimen of nebulizers, inhalers, spirometer/flutter valves.  She does finished her Augmentin course. ?- Patient to maintain O2 saturations 88-92% ?- Patient following with pulmonology ?- Patient to continue current regimen ?- Instructed to return if worsening symptoms over the next couple of days or if persistently low O2 saturations (<85%), at which time she is to follow-up with the ER and contact her pulmonologist ? ? ?Sevanna Ballengee, DO ?[place]   ?

## 2021-05-09 NOTE — Patient Instructions (Addendum)
It was so great seeing you today! Today we discussed the following: ? ?- Your oxygen was in the lower side in clinic today, over the next couple days and wanted to keep a close eye in the future persistently <85% then I want you to go to the ER to get evaluated. ? ?- If you are having worsening of your symptoms over the next day or so then please call your pulmonologist, if you are having the low oxygen as listed above with the worsening symptoms then please go to the ER ? ?-I am placing a referral to neuro rehab, they are the ones that would be able to certify you to get an electric wheelchair. ? ? ? ?Please make sure to bring any medications you take to your appointments. If you have any questions or concerns please call the office at [phone].  ?

## 2021-05-09 NOTE — Progress Notes (Signed)
Patient has no-showed to multiple appointments in a 6-month period. Per our no-show policy, a letter has been routed to [facility] Admin to be mailed to patient regarding likely dismissal for repeat no show. Will CC to PCP.  ° °

## 2021-05-10 ENCOUNTER — Telehealth: Payer: Self-pay

## 2021-05-10 ENCOUNTER — Telehealth: Payer: Self-pay | Admitting: Internal Medicine

## 2021-05-10 DIAGNOSIS — R052 Subacute cough: Secondary | ICD-10-CM

## 2021-05-10 MED ORDER — AMOXICILLIN-POT CLAVULANATE 875-125 MG PO TABS: 1.0000 | ORAL_TABLET | Freq: Two times a day (BID) | ORAL | 0 refills | Status: DC

## 2021-05-10 NOTE — Telephone Encounter (Signed)
Called patient back regarding request for additional antibiotics.  Discussed the case with Dr. Manson Passey initially with the plan for getting a chest x-ray before initiating further antibiotics.  When when I called the patient, she reported that she is unable to get to transportation for chest x-ray for the next week as her sons that transport her will be gone to [place].  Was unable to contact Dr. Manson Passey and so then contacted Dr. Leveda Anna for further assistance. ? ?After discussion with Dr. Leveda Anna, we will err on the side of caution and send in another dose of antibiotics.  I do not have much information at this time given difficulty of pulmonary exam that was documented previously and am basing the decision more so off of the risk of patient's worsening with her medical conditions.  She did have an increased requirement of oxygen while she was in the clinic, she does seem to be getting somewhat better after her prior course of Augmentin, but as the patient is not able to get an x-ray at this time we will send in another week's course of Augmentin.  Patient given strict instructions for return to care and red flag symptoms to go to the ER. ? ?If continued symptoms, patient will either need to be evaluated in the ER or obtain the chest x-rays, I will order them just in case patient is able to get them at some point. ? ?Attempted to call back patient to let her know that we were sending in the prescription and the order for the x-rays but she did not answer, voicemail was left ? ? ?Kealan Buchan, DO  ?

## 2021-05-10 NOTE — Telephone Encounter (Signed)
Called and spoke with pt who states she is still wheezing, coughing and getting up mucus since she was first prescribed meds by CY 2/20. Stated to pt if she was not any better and still requiring meds to help with her symptoms, she would need to now be seen for an appt as she has not been in office since 02/2021. Stated to pt that we could get her in for an appt Monday, 3/6 but if she could not wait until then that she would need to go to UC. Pt verbalized understanding, did not want to schedule an appt, and hung up the phone. Nothing further needed. ?

## 2021-05-10 NOTE — Telephone Encounter (Signed)
Patient calls nurse line regarding request for additional antibiotics. Patient states that she called her pulmonologist and was told to contact our office as we saw her yesterday.  ? ?Patient continues to have productive cough.  ? ?Please advise.  ? ?Talbot Grumbling, RN ? ?

## 2021-06-05 ENCOUNTER — Other Ambulatory Visit: Payer: Self-pay

## 2021-06-05 DIAGNOSIS — I10 Essential (primary) hypertension: Secondary | ICD-10-CM

## 2021-06-06 MED ORDER — FUROSEMIDE 40 MG PO TABS: 40.0000 mg | ORAL_TABLET | Freq: Every day | ORAL | 0 refills | Status: DC | PRN

## 2021-06-12 ENCOUNTER — Other Ambulatory Visit: Payer: Self-pay | Admitting: Internal Medicine

## 2021-06-12 NOTE — Telephone Encounter (Signed)
Dr. Maple Hudson, please advise on med refill. ? ?Allergies  ?Allergen Reactions  ? Garlic Anaphylaxis  ? Onion Anaphylaxis  ? Cephalexin Itching and Rash  ? Acetaminophen Nausea Only  ? Asa [Aspirin] Other (See Comments)  ? Hydrocodone-Acetaminophen Other (See Comments)  ?  REACTION: itching  ? Morphine And Related Other (See Comments)  ? Sulfamethoxazole-Trimethoprim Rash  ? ? ? ?Current Outpatient Medications:  ?  ADVAIR DISKUS 500-50 MCG/ACT AEPB, INHALE 1 PUFF INTO THE LUNGS TWICE DAILY, Disp: 60 each, Rfl: 3 ?  albuterol (PROVENTIL) (2.5 MG/3ML) 0.083% nebulizer solution, USE 1 VIAL IN NEBULIZER EVERY 4 HOURS, Disp: 75 mL, Rfl: 11 ?  albuterol (VENTOLIN HFA) 108 (90 Base) MCG/ACT inhaler, INHALE 2 PUFFS INTO THE LUNGS EVERY 6 HOURS AS NEEDED FOR WHEEZING OR SHORTNESS OF BREATH, Disp: 8.5 g, Rfl: 3 ?  amLODipine (NORVASC) 10 MG tablet, TAKE 1 TABLET AT BEDTIME., Disp: 90 tablet, Rfl: 2 ?  amLODipine (NORVASC) 10 MG tablet, Take by mouth., Disp: , Rfl:  ?  amoxicillin-clavulanate (AUGMENTIN) 875-125 MG tablet, Take 1 tablet by mouth 2 (two) times daily., Disp: 14 tablet, Rfl: 0 ?  aspirin 81 MG chewable tablet, Chew by mouth daily., Disp: , Rfl:  ?  carvedilol (COREG) 3.125 MG tablet, Take 1 tablet (3.125 mg total) by mouth 2 (two) times daily with a meal., Disp: 60 tablet, Rfl: 3 ?  ferrous sulfate 325 (65 FE) MG EC tablet, Take 1 tablet by mouth 2 (two) times daily., Disp: , Rfl:  ?  fluticasone (FLONASE) 50 MCG/ACT nasal spray, INSTILL 1 SPRAY INTO EACH NOSTRIL TWICE DAILY, Disp: 16 g, Rfl: 2 ?  furosemide (LASIX) 40 MG tablet, Take 1 tablet (40 mg total) by mouth daily as needed., Disp: 30 tablet, Rfl: 0 ?  INCRUSE ELLIPTA 62.5 MCG/ACT AEPB, INHALE 1 PUFF INTO THE LUNGS DAILY, Disp: 30 each, Rfl: 3 ?  loratadine (CLARITIN) 10 MG tablet, Take 1 tablet (10 mg total) by mouth daily., Disp: 30 tablet, Rfl: 11 ?  loratadine (CLARITIN) 10 MG tablet, Take by mouth., Disp: , Rfl:  ?  methocarbamol (ROBAXIN) 750 MG tablet,  Take 750 mg by mouth 3 (three) times daily., Disp: , Rfl:  ?  montelukast (SINGULAIR) 10 MG tablet, Take by mouth., Disp: , Rfl:  ?  montelukast (SINGULAIR) 10 MG tablet, TAKE 1 TABLET BY MOUTH AT BEDTIME., Disp: 30 tablet, Rfl: 11 ?  Multiple Vitamin (MULTIVITAMIN) capsule, Take 1 capsule by mouth daily., Disp: , Rfl:  ?  mupirocin ointment (BACTROBAN) 2 %, APPLY TO AFFECTED AREA(S) INTO THE NOSE 2 TIMES DAILY, Disp: 22 g, Rfl: 0 ?  omeprazole (PRILOSEC) 20 MG capsule, Take 1 capsule (20 mg total) by mouth daily., Disp: 90 capsule, Rfl: 2 ?  OVER THE COUNTER MEDICATION, Take 1 tablet by mouth every 12 (twelve) hours. mucus relief, Disp: , Rfl:  ?  OVER THE COUNTER MEDICATION, Take by mouth every 6 (six) hours as needed. Store brand niquil, 1 tablespoon ever 6 hours., Disp: , Rfl:  ?  oxyCODONE (OXYCONTIN) 20 mg 12 hr tablet, Take 10 mg by mouth. , Disp: , Rfl:  ?  OXYGEN, Place 2 L/min into the nose as directed. Oxygen via [place] @@ 2L/min on exertion with stationary concentrator and portable gas tank., Disp: , Rfl:  ?  potassium chloride SA (KLOR-CON) 20 MEQ tablet, Take by mouth., Disp: , Rfl:  ?  predniSONE (DELTASONE) 10 MG tablet, Take 4 tabs po daily x 2 days; then 3  tabs for 2 days; then 2 tabs for 2 days; then 1 tab for 2 days, Disp: 20 tablet, Rfl: 0 ?  TRAVATAN Z 0.004 % SOLN ophthalmic solution, Place 1 drop into both eyes at bedtime., Disp: , Rfl:  ?  varenicline (CHANTIX) 1 MG tablet, TAKE 1 TABLET BY MOUTH TWICE DAILY, Disp: 60 tablet, Rfl: 3 ? ?

## 2021-06-14 NOTE — Telephone Encounter (Signed)
Chantix refilled

## 2021-07-03 ENCOUNTER — Other Ambulatory Visit: Payer: Self-pay | Admitting: Adult Health

## 2021-07-14 ENCOUNTER — Telehealth: Payer: Self-pay | Admitting: Pulmonary Disease

## 2021-07-14 MED ORDER — ALBUTEROL SULFATE (2.5 MG/3ML) 0.083% IN NEBU: INHALATION_SOLUTION | RESPIRATORY_TRACT | 0 refills | Status: DC

## 2021-07-14 NOTE — Telephone Encounter (Signed)
Patient called to say that she has run out of her albuterol nebulizer and needs a refill ?She has not been seen in clinic in over a year ?I called in a prescription for 30-day supply.  She will need to call back and reestablishing the visit to continue prescription. ?

## 2021-07-15 ENCOUNTER — Telehealth: Payer: Self-pay | Admitting: Pulmonary Disease

## 2021-07-15 MED ORDER — ALBUTEROL SULFATE (2.5 MG/3ML) 0.083% IN NEBU: 2.5000 mg | INHALATION_SOLUTION | RESPIRATORY_TRACT | 0 refills | Status: DC | PRN

## 2021-07-15 NOTE — Telephone Encounter (Signed)
Per previous encounter from Dr. Isaiah Serge, "I called in a prescription for 30-day supply.  She will need to call back and reestablishing the visit to continue prescription" ? ?Pharmacy states they need a new script sent in with instructions- this was suppose to be an emergency refill. ? ?Please advise. ?

## 2021-07-15 NOTE — Telephone Encounter (Signed)
Rx previously sent to the pharmacy with no directions  ?I have sent correct rx  ?Called pt to schedule ov  ?She said she is not near her calendar and will have to call us back later  ?I reiterated that she needs rov for additional refills  ?She verbalized understanding  ? ?

## 2021-07-17 ENCOUNTER — Other Ambulatory Visit: Payer: Self-pay

## 2021-07-17 DIAGNOSIS — I10 Essential (primary) hypertension: Secondary | ICD-10-CM

## 2021-07-18 MED ORDER — FUROSEMIDE 40 MG PO TABS: 40.0000 mg | ORAL_TABLET | Freq: Every day | ORAL | 0 refills | Status: DC | PRN

## 2021-07-23 ENCOUNTER — Ambulatory Visit: Payer: Medicare (Managed Care) | Admitting: Physical Therapy

## 2021-08-07 ENCOUNTER — Other Ambulatory Visit: Payer: Self-pay

## 2021-08-08 MED ORDER — AMLODIPINE BESYLATE 10 MG PO TABS: 10.0000 mg | ORAL_TABLET | Freq: Every day | ORAL | 2 refills | Status: DC

## 2021-08-13 ENCOUNTER — Encounter: Payer: Self-pay | Admitting: *Deleted

## 2021-08-14 ENCOUNTER — Other Ambulatory Visit: Payer: Self-pay

## 2021-08-14 ENCOUNTER — Other Ambulatory Visit: Payer: Self-pay | Admitting: Internal Medicine

## 2021-08-14 DIAGNOSIS — I10 Essential (primary) hypertension: Secondary | ICD-10-CM

## 2021-08-14 MED ORDER — FUROSEMIDE 40 MG PO TABS: 40.0000 mg | ORAL_TABLET | Freq: Every day | ORAL | 0 refills | Status: DC | PRN

## 2021-08-14 MED ORDER — CARVEDILOL 3.125 MG PO TABS: 3.1250 mg | ORAL_TABLET | Freq: Two times a day (BID) | ORAL | 3 refills | Status: DC

## 2021-09-11 ENCOUNTER — Other Ambulatory Visit: Payer: Self-pay | Admitting: Internal Medicine

## 2021-09-24 ENCOUNTER — Other Ambulatory Visit: Payer: Self-pay

## 2021-09-24 DIAGNOSIS — I10 Essential (primary) hypertension: Secondary | ICD-10-CM

## 2021-09-24 MED ORDER — FUROSEMIDE 40 MG PO TABS: 40.0000 mg | ORAL_TABLET | Freq: Every day | ORAL | 0 refills | Status: DC | PRN

## 2021-10-10 ENCOUNTER — Other Ambulatory Visit: Payer: Self-pay | Admitting: Internal Medicine

## 2021-10-10 ENCOUNTER — Other Ambulatory Visit: Payer: Self-pay

## 2021-10-10 DIAGNOSIS — K219 Gastro-esophageal reflux disease without esophagitis: Secondary | ICD-10-CM

## 2021-10-10 NOTE — Telephone Encounter (Signed)
Chantix refilled

## 2021-10-10 NOTE — Telephone Encounter (Signed)
Incruse refilled 

## 2021-10-11 MED ORDER — OMEPRAZOLE 20 MG PO CPDR: 20.0000 mg | DELAYED_RELEASE_CAPSULE | Freq: Every day | ORAL | 0 refills | Status: DC

## 2021-11-04 ENCOUNTER — Other Ambulatory Visit: Payer: Self-pay

## 2021-11-04 DIAGNOSIS — I10 Essential (primary) hypertension: Secondary | ICD-10-CM

## 2021-11-04 MED ORDER — FUROSEMIDE 40 MG PO TABS
40.0000 mg | ORAL_TABLET | Freq: Every day | ORAL | 0 refills | Status: DC | PRN
Start: 2021-11-04 — End: 2021-12-06

## 2021-11-13 ENCOUNTER — Other Ambulatory Visit: Payer: Self-pay

## 2021-11-13 DIAGNOSIS — M25551 Pain in right hip: Secondary | ICD-10-CM

## 2021-11-13 DIAGNOSIS — K219 Gastro-esophageal reflux disease without esophagitis: Secondary | ICD-10-CM

## 2021-11-13 MED ORDER — MUPIROCIN 2 % EX OINT: TOPICAL_OINTMENT | CUTANEOUS | 0 refills | Status: DC

## 2021-11-13 MED ORDER — OMEPRAZOLE 20 MG PO CPDR: 20.0000 mg | DELAYED_RELEASE_CAPSULE | Freq: Every day | ORAL | 0 refills | Status: DC

## 2021-12-06 ENCOUNTER — Other Ambulatory Visit: Payer: Self-pay

## 2021-12-06 DIAGNOSIS — I10 Essential (primary) hypertension: Secondary | ICD-10-CM

## 2021-12-06 MED ORDER — FUROSEMIDE 40 MG PO TABS: 40.0000 mg | ORAL_TABLET | Freq: Every day | ORAL | 3 refills | Status: DC | PRN

## 2021-12-09 ENCOUNTER — Other Ambulatory Visit: Payer: Self-pay

## 2021-12-09 DIAGNOSIS — I10 Essential (primary) hypertension: Secondary | ICD-10-CM

## 2021-12-09 MED ORDER — CARVEDILOL 3.125 MG PO TABS: 3.1250 mg | ORAL_TABLET | Freq: Two times a day (BID) | ORAL | 3 refills | Status: DC

## 2022-01-08 ENCOUNTER — Other Ambulatory Visit: Payer: Self-pay | Admitting: Internal Medicine

## 2022-02-14 ENCOUNTER — Other Ambulatory Visit: Payer: Self-pay | Admitting: Internal Medicine

## 2022-02-17 ENCOUNTER — Other Ambulatory Visit: Payer: Self-pay | Admitting: Internal Medicine

## 2022-02-17 DIAGNOSIS — J441 Chronic obstructive pulmonary disease with (acute) exacerbation: Secondary | ICD-10-CM

## 2022-02-18 ENCOUNTER — Other Ambulatory Visit: Payer: Self-pay | Admitting: Internal Medicine

## 2022-02-18 ENCOUNTER — Other Ambulatory Visit: Payer: Self-pay

## 2022-02-18 DIAGNOSIS — K219 Gastro-esophageal reflux disease without esophagitis: Secondary | ICD-10-CM

## 2022-02-18 DIAGNOSIS — M25551 Pain in right hip: Secondary | ICD-10-CM

## 2022-02-18 MED ORDER — MUPIROCIN 2 % EX OINT: TOPICAL_OINTMENT | CUTANEOUS | 0 refills | Status: DC

## 2022-02-18 MED ORDER — OMEPRAZOLE 20 MG PO CPDR: 20.0000 mg | DELAYED_RELEASE_CAPSULE | Freq: Every day | ORAL | 0 refills | Status: DC

## 2022-02-26 ENCOUNTER — Telehealth: Payer: Self-pay | Admitting: Family Medicine

## 2022-02-26 NOTE — Telephone Encounter (Signed)
Left message for patient to call back and schedule the Medicare Annual Wellness Visit (AWV) virtually or by telephone.  Last AWV 02/12/21  Please schedule at anytime with [facility].  30 minute appointment  Any questions, please call me at [phone]

## 2022-03-21 ENCOUNTER — Other Ambulatory Visit: Payer: Self-pay

## 2022-03-21 DIAGNOSIS — M25551 Pain in right hip: Secondary | ICD-10-CM

## 2022-03-24 MED ORDER — MUPIROCIN 2 % EX OINT: TOPICAL_OINTMENT | CUTANEOUS | 0 refills | Status: DC

## 2022-03-25 ENCOUNTER — Telehealth: Payer: Self-pay | Admitting: Internal Medicine

## 2022-03-25 MED ORDER — PREDNISONE 10 MG PO TABS: ORAL_TABLET | ORAL | 0 refills | Status: DC

## 2022-03-25 MED ORDER — AMOXICILLIN-POT CLAVULANATE 875-125 MG PO TABS: 1.0000 | ORAL_TABLET | Freq: Two times a day (BID) | ORAL | 0 refills | Status: DC

## 2022-03-25 NOTE — Telephone Encounter (Signed)
I sent prednisone and augmentin to [facility]

## 2022-03-25 NOTE — Telephone Encounter (Signed)
Spoke with the pt  She states that she has been coughing and wheezing past 3 days  Cough has been prod with green to yellow sputum  She states her temp today is 101  Her sats were 88%  2lpm and she increased to 93% on 3lpm o2  She states that she is unable to seek care bc she can not leave her house and does not want to be admitted to hospital  She has not been seen in person since 2021  She says she took at home covid test and this was neg  Please advise thanks!  Allergies  Allergen Reactions   Garlic Anaphylaxis   Onion Anaphylaxis   Cephalexin Itching and Rash   Acetaminophen Nausea Only   Asa [Aspirin] Other (See Comments)   Hydrocodone-Acetaminophen Other (See Comments)    REACTION: itching   Morphine And Related Other (See Comments)   Sulfamethoxazole-Trimethoprim Rash

## 2022-03-25 NOTE — Telephone Encounter (Signed)
Patient is returning a call.  Please call patient at [phone]

## 2022-03-25 NOTE — Telephone Encounter (Signed)
Called the pt and there was no answer- LMTCB    

## 2022-03-25 NOTE — Telephone Encounter (Signed)
Spoke with the pt and notified of response per Dr Young  °She verbalized understanding  °Nothing further needed °

## 2022-04-04 ENCOUNTER — Other Ambulatory Visit: Payer: Self-pay

## 2022-04-04 DIAGNOSIS — I10 Essential (primary) hypertension: Secondary | ICD-10-CM

## 2022-04-04 MED ORDER — CARVEDILOL 3.125 MG PO TABS: 3.1250 mg | ORAL_TABLET | Freq: Two times a day (BID) | ORAL | 3 refills | Status: DC

## 2022-04-04 MED ORDER — FUROSEMIDE 40 MG PO TABS: 40.0000 mg | ORAL_TABLET | Freq: Every day | ORAL | 3 refills | Status: DC | PRN

## 2022-04-28 ENCOUNTER — Other Ambulatory Visit: Payer: Self-pay | Admitting: Internal Medicine

## 2022-04-28 NOTE — Telephone Encounter (Signed)
Chantix refilled

## 2022-04-28 NOTE — Telephone Encounter (Signed)
Please advise on refill request   Allergies  Allergen Reactions   Garlic Anaphylaxis   Onion Anaphylaxis   Cephalexin Itching and Rash   Acetaminophen Nausea Only   Asa [Aspirin] Other (See Comments)   Hydrocodone-Acetaminophen Other (See Comments)    REACTION: itching   Morphine And Related Other (See Comments)   Sulfamethoxazole-Trimethoprim Rash     Current Outpatient Medications:    ADVAIR DISKUS 500-50 MCG/ACT AEPB, INHALE 1 PUFF INTO THE LUNGS TWICE DAILY, Disp: 60 each, Rfl: 4   albuterol (PROVENTIL) (2.5 MG/3ML) 0.083% nebulizer solution, USE 1 VIAL IN NEBULIZER EVERY 4 HOURS, Disp: 75 mL, Rfl: 0   albuterol (VENTOLIN HFA) 108 (90 Base) MCG/ACT inhaler, INHALE 2 PUFFS INTO THE LUNGS EVERY 6 HOURS AS NEEDED FOR WHEEZING OR SHORTNESS OF BREATH, Disp: 8.5 g, Rfl: 4   amLODipine (NORVASC) 10 MG tablet, Take by mouth., Disp: , Rfl:    amLODipine (NORVASC) 10 MG tablet, Take 1 tablet (10 mg total) by mouth at bedtime., Disp: 90 tablet, Rfl: 2   amoxicillin-clavulanate (AUGMENTIN) 875-125 MG tablet, Take 1 tablet by mouth 2 (two) times daily., Disp: 14 tablet, Rfl: 0   aspirin 81 MG chewable tablet, Chew by mouth daily., Disp: , Rfl:    carvedilol (COREG) 3.125 MG tablet, Take 1 tablet (3.125 mg total) by mouth 2 (two) times daily with a meal., Disp: 60 tablet, Rfl: 3   ferrous sulfate 325 (65 FE) MG EC tablet, Take 1 tablet by mouth 2 (two) times daily., Disp: , Rfl:    fluticasone (FLONASE) 50 MCG/ACT nasal spray, INSTILL 1 SPRAY INTO EACH NOSTRIL TWICE DAILY, Disp: 16 g, Rfl: 2   furosemide (LASIX) 40 MG tablet, Take 1 tablet (40 mg total) by mouth daily as needed., Disp: 30 tablet, Rfl: 3   loratadine (CLARITIN) 10 MG tablet, Take 1 tablet (10 mg total) by mouth daily., Disp: 30 tablet, Rfl: 11   loratadine (CLARITIN) 10 MG tablet, Take by mouth., Disp: , Rfl:    methocarbamol (ROBAXIN) 750 MG tablet, Take 750 mg by mouth 3 (three) times daily., Disp: , Rfl:    montelukast  (SINGULAIR) 10 MG tablet, Take by mouth., Disp: , Rfl:    montelukast (SINGULAIR) 10 MG tablet, TAKE 1 TABLET BY MOUTH AT BEDTIME., Disp: 30 tablet, Rfl: 10   Multiple Vitamin (MULTIVITAMIN) capsule, Take 1 capsule by mouth daily., Disp: , Rfl:    mupirocin ointment (BACTROBAN) 2 %, APPLY TO AFFECTED AREA(S) INTO THE NOSE 2 TIMES DAILY, Disp: 22 g, Rfl: 0   omeprazole (PRILOSEC) 20 MG capsule, Take 1 capsule (20 mg total) by mouth daily., Disp: 90 capsule, Rfl: 0   OVER THE COUNTER MEDICATION, Take 1 tablet by mouth every 12 (twelve) hours. mucus relief, Disp: , Rfl:    OVER THE COUNTER MEDICATION, Take by mouth every 6 (six) hours as needed. Store brand niquil, 1 tablespoon ever 6 hours., Disp: , Rfl:    oxyCODONE (OXYCONTIN) 20 mg 12 hr tablet, Take 10 mg by mouth. , Disp: , Rfl:    OXYGEN, Place 2 L/min into the nose as directed. Oxygen via [place] @@ 2L/min on exertion with stationary concentrator and portable gas tank., Disp: , Rfl:    potassium chloride SA (KLOR-CON) 20 MEQ tablet, Take by mouth., Disp: , Rfl:    predniSONE (DELTASONE) 10 MG tablet, 4 X 2 DAYS, 3 X 2 DAYS, 2 X 2 DAYS, 1 X 2 DAYS, Disp: 20 tablet, Rfl: 0   TRAVATAN Z  0.004 % SOLN ophthalmic solution, Place 1 drop into both eyes at bedtime., Disp: , Rfl:    umeclidinium bromide (INCRUSE ELLIPTA) 62.5 MCG/ACT AEPB, INHALE 1 PUFF INTO THE LUNGS DAILY, Disp: 30 each, Rfl: 12   varenicline (CHANTIX) 1 MG tablet, TAKE 1 TABLET BY MOUTH TWICE DAILY, Disp: 60 tablet, Rfl: 5

## 2022-05-05 ENCOUNTER — Other Ambulatory Visit: Payer: Self-pay

## 2022-05-05 DIAGNOSIS — M25551 Pain in right hip: Secondary | ICD-10-CM

## 2022-05-05 MED ORDER — MUPIROCIN 2 % EX OINT: TOPICAL_OINTMENT | CUTANEOUS | 0 refills | Status: DC

## 2022-05-12 ENCOUNTER — Other Ambulatory Visit: Payer: Self-pay

## 2022-05-13 MED ORDER — AMLODIPINE BESYLATE 10 MG PO TABS: 10.0000 mg | ORAL_TABLET | Freq: Every day | ORAL | 3 refills | Status: DC

## 2022-06-20 ENCOUNTER — Telehealth: Payer: Self-pay | Admitting: Family Medicine

## 2022-06-20 NOTE — Telephone Encounter (Signed)
Called patient to schedule Medicare Annual Wellness Visit (AWV). Left message for patient to call back and schedule Medicare Annual Wellness Visit (AWV).  Last date of AWV: 02/12/2021   Please schedule an AWVS appointment at any time with [facility] VISIT.  If any questions, please contact me at [phone].    Thank you,  [facility] Support [facility] Direct dial  [phone]

## 2022-07-01 ENCOUNTER — Telehealth: Payer: Self-pay

## 2022-07-01 NOTE — Transitions of Care (Post Inpatient/ED Visit) (Signed)
   07/01/2022  Name: [name] MRN: [id number] DOB: Apr 22, 1965  Today's TOC FU Call Status: Today's TOC FU Call Status:: Unsuccessul Call (1st Attempt) Unsuccessful Call (1st Attempt) Date: 07/01/22  Attempted to reach the patient regarding the most recent Inpatient/ED visit.  Follow Up Plan: Additional outreach attempts will be made to reach the patient to complete the Transitions of Care (Post Inpatient/ED visit) call.   Signature Karena Addison, LPN [facility] Nurse Health Advisor Direct Dial [phone]

## 2022-07-03 NOTE — Transitions of Care (Post Inpatient/ED Visit) (Signed)
   07/03/2022  Name: [name] MRN: [id number] DOB: November 19, 1965  Today's TOC FU Call Status: Today's TOC FU Call Status:: Unsuccessful Call (2nd Attempt) Unsuccessful Call (1st Attempt) Date: 07/01/22 Unsuccessful Call (2nd Attempt) Date: 07/03/22  Attempted to reach the patient regarding the most recent Inpatient/ED visit.  Follow Up Plan: Additional outreach attempts will be made to reach the patient to complete the Transitions of Care (Post Inpatient/ED visit) call.   Signature Karena Addison, LPN [facility] Nurse Health Advisor Direct Dial [phone]

## 2022-07-07 NOTE — Transitions of Care (Post Inpatient/ED Visit) (Signed)
   07/07/2022  Name: [name] MRN: [id number] DOB: 12-16-1965  Today's TOC FU Call Status: Today's TOC FU Call Status:: Unsuccessful Call (3rd Attempt) Unsuccessful Call (1st Attempt) Date: 07/01/22 Unsuccessful Call (2nd Attempt) Date: 07/03/22 Unsuccessful Call (3rd Attempt) Date: 07/07/22  Attempted to reach the patient regarding the most recent Inpatient/ED visit.  Follow Up Plan: No further outreach attempts will be made at this time. We have been unable to contact the patient. Karena Addison, LPN [facility] Nurse Health Advisor Direct Dial [phone]  Signature  Karena Addison, LPN [facility] Nurse Health Advisor Direct Dial [phone]

## 2022-08-07 ENCOUNTER — Other Ambulatory Visit: Payer: Self-pay | Admitting: Internal Medicine

## 2022-08-13 NOTE — Progress Notes (Deleted)
HPI female one pack per day smoker followed for COPD mixed type, Chronic Respiratory Failure Hypoxic,, lung nodules, seasonal allergic rhinitis, tobacco abuse, complicated by hip surgery/infection/repair, anxiety/depression, GERD, HBP, obesity, Husband is a patient here ( blind and on dialysis)  Allergy skin test- significant positives for grass weed and tree pollens, dust mite, cockroach. Office Spirometry-08/19/2013-severe obstructive airways disease. FVC 2.05/71%, FEV1 1.04/42%, FEV1/FVC 0.51, FEF 25-75%  0.50/17% Office spirometry 07/06/14-severe obstructive airways disease. FVC 2.37/79%, FEV1 0.92/37%, FEV1/FVC 0.39, FEF 25-75 percent 0.26/9%  -----------------------------------------------------------------------------------------   02/28/21-Virtual Visit via Video Note  I connected with [name] on 08/13/22 at 10:00 AM EDT by a video enabled telemedicine application and verified that I am speaking with the correct person using two identifiers.  Location: Patient: home Provider: office   I discussed the limitations of evaluation and management by telemedicine and the availability of in person appointments. The patient expressed understanding and agreed to proceed.  History of Present Illness: 57 year old female Smoker followed for COPD mixed type, lung nodules, Chronic resp failure hypoxic, seasonal allergic rhinitis, tobacco abuse, complicated by hip surgery/infection/repair, anxiety/depression, GERD, HBP, obesity, O2 2-3 L continuous  Incruse, Singulair, Claritin,  Advair 500, Chantix, albuterol hfa, neb albuterol,  Latest hip surgery 02/10/18- repeated complications. She doesn't intend to allow any more hip surgery. Admits to 2 cigarettes/day and using Chantix. Choked on a piece of egg biscuit and evaluated for aspiration pneumonia at [facility].  She says she eventually coughed out the piece of biscuit.  CT chest was done.  Treated with Flagyl, Levaquin and  prednisone. Says her usual oxygen saturation on 3 L is about 94% but this morning 90%.  She has a nebulizer machine and a flutter device.  Still coughing up some white phlegm. She expressed concern that she might need to extend antibiotic longer.   Observations/Objective: Light cough evident during video conversation  Assessment and Plan: Aspiration pneumonia COPD Chronic respiratory failure with hypoxia  Follow Up Instructions: Augmentin, CXR   I discussed the assessment and treatment plan with the patient. The patient was provided an opportunity to ask questions and all were answered. The patient agreed with the plan and demonstrated an understanding of the instructions.   The patient was advised to call back or seek an in-person evaluation if the symptoms worsen or if the condition fails to improve as anticipated.  I provided 20 minutes of non-face-to-face time during this encounter.   Jetty Duhamel, MD  08/18/22- 57 year old female Smoker followed for COPD mixed type, lung nodules, Chronic resp failure hypoxic, seasonal allergic rhinitis, tobacco abuse, complicated by hip surgery/infection/repair, anxiety/depression, GERD, HBP, obesity, O2 2-3 L continuous  Incruse, Singulair, Claritin,  Advair 500, Chantix, albuterol hfa, neb albuterol,  Latest hip surgery 02/10/18- repeated complications. She doesn't intend to allow any more hip surgery. Admits to 2 cigarettes/day and using Chantix   -------------------------------------  ROS-see HPI     + = positive Constitutional:   No-   weight loss, night sweats, fevers, chills, fatigue, lassitude. HEENT:   No-  headaches, difficulty swallowing, tooth/dental problems, no-sore throat,       No- sneezing, no-itching, ear ache, +nasal congestion,+ post nasal drip,  CV:  No-   chest pain, orthopnea, PND,+ swelling in lower extremities, no-anasarca,  dizziness, palpitations Resp: +  shortness of breath with exertion or at rest.             + productive cough, + non-productive cough,  No- coughing up of blood.              No-   change in color of mucus.  No- wheezing.   Skin: No-   rash or lesions. GI:  + heartburn, indigestion, No-abdominal pain, nausea, vomiting,  GU:  MS:  + joint pain or swelling.   Neuro-     nothing unusual Psych:  No- change in mood or affect. + depression or anxiety.  No memory loss.  OBJ- Physical Exam General- Alert, Oriented, Affect-appropriate, Distress- none acute. + Overweight, + talkative + wheelchair,                                     +O2 Skin- rash-none, lesions- none, excoriation- none Lymphadenopathy- none Head- atraumatic            Eyes- Gross vision intact, PERRLA, conjunctivae and secretions clear            Ears- Hearing, canals-normal            Nose- + turbinate edema, no-Septal dev, +mucus bridging, polyps, erosion, perforation             Throat- Mallampati III , mucosa clear , drainage- none, tonsils- atrophic, + dentures,  Neck- flexible , trachea midline, no stridor , thyroid nl, carotid no bruit Chest - symmetrical excursion , unlabored           Heart/CV- RRR , no murmur , no gallop  , no rub, nl s1 s2                           - JVD- none , edema- none, stasis changes- none, varices- none           Lung-  wheeze + coarse/unlabored, cough+ upper airway rattle,  dullness-none, rub- none           Chest wall-  Abd-  Br/ Gen/ Rectal- Not done, not indicated Extrem- cyanosis- none, clubbing, none, atrophy- none, strength- nl, + wheelchair Neuro- grossly intact to observation

## 2022-08-18 ENCOUNTER — Institutional Professional Consult (permissible substitution): Payer: Medicare (Managed Care) | Admitting: Internal Medicine

## 2022-08-18 ENCOUNTER — Other Ambulatory Visit: Payer: Self-pay

## 2022-08-18 ENCOUNTER — Other Ambulatory Visit: Payer: Self-pay | Admitting: Internal Medicine

## 2022-08-18 DIAGNOSIS — K219 Gastro-esophageal reflux disease without esophagitis: Secondary | ICD-10-CM

## 2022-08-18 DIAGNOSIS — M25551 Pain in right hip: Secondary | ICD-10-CM

## 2022-08-18 MED ORDER — OMEPRAZOLE 20 MG PO CPDR: 20.0000 mg | DELAYED_RELEASE_CAPSULE | Freq: Every day | ORAL | 0 refills | Status: AC

## 2022-08-18 MED ORDER — MUPIROCIN 2 % EX OINT: TOPICAL_OINTMENT | CUTANEOUS | 0 refills | Status: DC

## 2022-08-19 ENCOUNTER — Other Ambulatory Visit: Payer: Self-pay

## 2022-08-19 DIAGNOSIS — I10 Essential (primary) hypertension: Secondary | ICD-10-CM

## 2022-08-20 MED ORDER — FUROSEMIDE 40 MG PO TABS: 40.0000 mg | ORAL_TABLET | Freq: Every day | ORAL | 3 refills | Status: DC | PRN

## 2022-09-24 NOTE — Progress Notes (Deleted)
HPI female one pack per day smoker followed for COPD mixed type, Chronic Respiratory Failure Hypoxic,, lung nodules, seasonal allergic rhinitis, tobacco abuse, complicated by hip surgery/infection/repair, anxiety/depression, GERD, HBP, obesity, Husband is a patient here ( blind and on dialysis)  Allergy skin test- significant positives for grass weed and tree pollens, dust mite, cockroach. Office Spirometry-08/19/2013-severe obstructive airways disease. FVC 2.05/71%, FEV1 1.04/42%, FEV1/FVC 0.51, FEF 25-75%  0.50/17% Office spirometry 07/06/14-severe obstructive airways disease. FVC 2.37/79%, FEV1 0.92/37%, FEV1/FVC 0.39, FEF 25-75 percent 0.26/9%  -----------------------------------------------------------------------------------------  02/28/21-Virtual Visit via Video Note  I connected with [name] on 09/24/22 at 10:30 AM EDT by a video enabled telemedicine application and verified that I am speaking with the correct person using two identifiers.  Location: Patient: home Provider: office   I discussed the limitations of evaluation and management by telemedicine and the availability of in person appointments. The patient expressed understanding and agreed to proceed.  History of Present Illness: 57 year old female Smoker followed for COPD mixed type, lung nodules, Chronic resp failure hypoxic, seasonal allergic rhinitis, tobacco abuse, complicated by hip surgery/infection/repair, anxiety/depression, GERD, HBP, obesity, O2 2-3 L continuous  Incruse, Singulair, Claritin,  Advair 500, Chantix, albuterol hfa, neb albuterol,  Latest hip surgery 02/10/18- repeated complications. She doesn't intend to allow any more hip surgery. Admits to 2 cigarettes/day and using Chantix. Choked on a piece of egg biscuit and evaluated for aspiration pneumonia at [facility].  She says she eventually coughed out the piece of biscuit.  CT chest was done.  Treated with Flagyl, Levaquin and  prednisone. Says her usual oxygen saturation on 3 L is about 94% but this morning 90%.  She has a nebulizer machine and a flutter device.  Still coughing up some white phlegm. She expressed concern that she might need to extend antibiotic longer.   Observations/Objective: Light cough evident during video conversation  Assessment and Plan: Aspiration pneumonia COPD Chronic respiratory failure with hypoxia  Follow Up Instructions: Augmentin, CXR   I discussed the assessment and treatment plan with the patient. The patient was provided an opportunity to ask questions and all were answered. The patient agreed with the plan and demonstrated an understanding of the instructions.   The patient was advised to call back or seek an in-person evaluation if the symptoms worsen or if the condition fails to improve as anticipated.  I provided 20 minutes of non-face-to-face time during this encounter.   Jetty Duhamel, MD   09/26/22- 57 year old female Smoker followed for COPD mixed type, lung nodules, Chronic resp failure hypoxic, seasonal allergic rhinitis, tobacco abuse, complicated by hip surgery/infection/repair, anxiety/depression, GERD, HBP, obesity, O2 2-3 L continuous  Incruse, Singulair, Claritin,  Advair 500, Chantix, albuterol hfa, neb albuterol,  Latest hip surgery 02/10/18- repeated complications. She doesn't intend to allow any more hip surgery.    ROS-see HPI     + = positive Constitutional:   No-   weight loss, night sweats, fevers, chills, fatigue, lassitude. HEENT:   No-  headaches, difficulty swallowing, tooth/dental problems, no-sore throat,       No- sneezing, no-itching, ear ache, +nasal congestion,+ post nasal drip,  CV:  No-   chest pain, orthopnea, PND,+ swelling in lower extremities, no-anasarca,  dizziness, palpitations Resp: + shortness of breath with exertion or at rest.             + productive cough, +  non-productive cough,  No- coughing up of blood.              No-   change in color of mucus.  No- wheezing.   Skin: No-   rash or lesions. GI:  + heartburn, indigestion, No-abdominal pain, nausea, vomiting,  GU:  MS:  + joint pain or swelling.   Neuro-     nothing unusual Psych:  No- change in mood or affect. + depression or anxiety.  No memory loss.  OBJ- Physical Exam General- Alert, Oriented, Affect-appropriate, Distress- none acute. + Overweight, + talkative + wheelchair,                                     +O2 Skin- rash-none, lesions- none, excoriation- none Lymphadenopathy- none Head- atraumatic            Eyes- Gross vision intact, PERRLA, conjunctivae and secretions clear            Ears- Hearing, canals-normal            Nose- + turbinate edema, no-Septal dev, +mucus bridging, polyps, erosion, perforation             Throat- Mallampati III , mucosa clear , drainage- none, tonsils- atrophic, + dentures,  Neck- flexible , trachea midline, no stridor , thyroid nl, carotid no bruit Chest - symmetrical excursion , unlabored           Heart/CV- RRR , no murmur , no gallop  , no rub, nl s1 s2                           - JVD- none , edema- none, stasis changes- none, varices- none           Lung-  wheeze + coarse/unlabored, cough+ upper airway rattle,  dullness-none, rub- none           Chest wall-  Abd-  Br/ Gen/ Rectal- Not done, not indicated Extrem- cyanosis- none, clubbing, none, atrophy- none, strength- nl, + wheelchair Neuro- grossly intact to observation

## 2022-09-26 ENCOUNTER — Institutional Professional Consult (permissible substitution): Payer: Medicare (Managed Care) | Admitting: Internal Medicine

## 2022-11-05 NOTE — Progress Notes (Deleted)
HPI female one pack per day smoker followed for COPD mixed type, Chronic Respiratory Failure Hypoxic,, lung nodules, seasonal allergic rhinitis, tobacco abuse, complicated by hip surgery/infection/repair, anxiety/depression, GERD, HBP, obesity, Husband is a patient here ( blind and on dialysis)  Allergy skin test- significant positives for grass weed and tree pollens, dust mite, cockroach. Office Spirometry-08/19/2013-severe obstructive airways disease. FVC 2.05/71%, FEV1 1.04/42%, FEV1/FVC 0.51, FEF 25-75%  0.50/17% Office spirometry 07/06/14-severe obstructive airways disease. FVC 2.37/79%, FEV1 0.92/37%, FEV1/FVC 0.39, FEF 25-75 percent 0.26/9%  -----------------------------------------------------------------------------------------   02/28/21-Virtual Visit via Video Note  I connected with [name] on 11/05/22 at 10:30 AM EDT by a video enabled telemedicine application and verified that I am speaking with the correct person using two identifiers.  Location: Patient: home Provider: office   I discussed the limitations of evaluation and management by telemedicine and the availability of in person appointments. The patient expressed understanding and agreed to proceed.  History of Present Illness: 57 year old female Smoker followed for COPD mixed type, lung nodules, Chronic resp failure hypoxic, seasonal allergic rhinitis, tobacco abuse, complicated by hip surgery/infection/repair, anxiety/depression, GERD, HBP, obesity, O2 2-3 L continuous  Incruse, Singulair, Claritin,  Advair 500, Chantix, albuterol hfa, neb albuterol,  Latest hip surgery 02/10/18- repeated complications. She doesn't intend to allow any more hip surgery. Admits to 2 cigarettes/day and using Chantix. Choked on a piece of egg biscuit and evaluated for aspiration pneumonia at [facility].  She says she eventually coughed out the piece of biscuit.  CT chest was done.  Treated with Flagyl, Levaquin and  prednisone. Says her usual oxygen saturation on 3 L is about 94% but this morning 90%.  She has a nebulizer machine and a flutter device.  Still coughing up some white phlegm. She expressed concern that she might need to extend antibiotic longer.   Observations/Objective: Light cough evident during video conversation  Assessment and Plan: Aspiration pneumonia COPD Chronic respiratory failure with hypoxia  Follow Up Instructions: Augmentin, CXR   I discussed the assessment and treatment plan with the patient. The patient was provided an opportunity to ask questions and all were answered. The patient agreed with the plan and demonstrated an understanding of the instructions.   The patient was advised to call back or seek an in-person evaluation if the symptoms worsen or if the condition fails to improve as anticipated.  I provided 20 minutes of non-face-to-face time during this encounter.   Kelly Duhamel, MD  11/07/22- 57 year old female Smoker followed for COPD mixed type, lung nodules, Chronic resp failure hypoxic, seasonal allergic rhinitis, tobacco abuse, complicated by hip surgery/infection/repair, anxiety/depression, GERD, HBP, obesity, O2 2-3 L continuous  Incruse, Singulair, Claritin,  Advair 500, Chantix, albuterol hfa, neb albuterol,  Latest hip surgery 02/10/18- repeated complications. She doesn't intend to allow any more hip surgery. Admits to 2 cigarettes/day and using Chantix.  -------------------------------------  ROS-see HPI     + = positive Constitutional:   No-   weight loss, night sweats, fevers, chills, fatigue, lassitude. HEENT:   No-  headaches, difficulty swallowing, tooth/dental problems, no-sore throat,       No- sneezing, no-itching, ear ache, +nasal congestion,+ post nasal drip,  CV:  No-   chest pain, orthopnea, PND,+ swelling in lower extremities, no-anasarca,  dizziness, palpitations Resp: +  shortness of breath with exertion or at rest.             + productive cough, + non-productive cough,  No- coughing up of blood.              No-   change in color of mucus.  No- wheezing.   Skin: No-   rash or lesions. GI:  + heartburn, indigestion, No-abdominal pain, nausea, vomiting,  GU:  MS:  + joint pain or swelling.   Neuro-     nothing unusual Psych:  No- change in mood or affect. + depression or anxiety.  No memory loss.  OBJ- Physical Exam General- Alert, Oriented, Affect-appropriate, Distress- none acute. + Overweight, + talkative + wheelchair,                                     +O2 Skin- rash-none, lesions- none, excoriation- none Lymphadenopathy- none Head- atraumatic            Eyes- Gross vision intact, PERRLA, conjunctivae and secretions clear            Ears- Hearing, canals-normal            Nose- + turbinate edema, no-Septal dev, +mucus bridging, polyps, erosion, perforation             Throat- Mallampati III , mucosa clear , drainage- none, tonsils- atrophic, + dentures,  Neck- flexible , trachea midline, no stridor , thyroid nl, carotid no bruit Chest - symmetrical excursion , unlabored           Heart/CV- RRR , no murmur , no gallop  , no rub, nl s1 s2                           - JVD- none , edema- none, stasis changes- none, varices- none           Lung-  wheeze + coarse/unlabored, cough+ upper airway rattle,  dullness-none, rub- none           Chest wall-  Abd-  Br/ Gen/ Rectal- Not done, not indicated Extrem- cyanosis- none, clubbing, none, atrophy- none, strength- nl, + wheelchair Neuro- grossly intact to observation

## 2022-11-07 ENCOUNTER — Institutional Professional Consult (permissible substitution): Payer: Medicare (Managed Care) | Admitting: Internal Medicine

## 2022-12-01 ENCOUNTER — Telehealth: Payer: Self-pay | Admitting: Internal Medicine

## 2022-12-01 NOTE — Telephone Encounter (Signed)
Patient states having symptoms of shortness of breath, nausea, cough and mucus. Patient oxygen level is 82 %. Dr. Maple Hudson advised patient to call our office. Pharmacy is [facility]. Patient scheduled 12/16/2022 with Dr. Maple Hudson. Patient phone number is [phone].

## 2022-12-02 NOTE — Telephone Encounter (Signed)
Called and spoke with patient, she states her oxygen level on 3.5 L is 81%.   Her concentrator only goes to 5L and then she says it makes a lot of noise and cuts off.   Patient turned oxygen up to 4L and sats were still only 82%.  Advised her that she would need to go to the ER since her concentrator only goes to 5L and she cannot get her sats up above 88-90%. She states she has an appointment in October, that was the first she could come in.  I advised her she needed to be seen in the ER.  She stated her older son was not at home and her younger son cannot drive.  She said that she knows what she needs, an antibiotic and prednisone, but no one will listen to her.  I advised her that she has not been seen in our office in over a year and she has to be seen once a year for [place] to be able to prescribe medications.  I let her know that I am just trying to get her taken care of and being seen in the ER is what is best right now.   As I was talking to her she hung up the phone.  Closing encounter.

## 2022-12-14 NOTE — Progress Notes (Deleted)
HPI female one pack per day smoker followed for COPD mixed type, Chronic Respiratory Failure Hypoxic,, lung nodules, seasonal allergic rhinitis, tobacco abuse, complicated by hip surgery/infection/repair, anxiety/depression, GERD, HBP, obesity, Husband is a patient here ( blind and on dialysis)  Allergy skin test- significant positives for grass weed and tree pollens, dust mite, cockroach. Office Spirometry-08/19/2013-severe obstructive airways disease. FVC 2.05/71%, FEV1 1.04/42%, FEV1/FVC 0.51, FEF 25-75%  0.50/17% Office spirometry 07/06/14-severe obstructive airways disease. FVC 2.37/79%, FEV1 0.92/37%, FEV1/FVC 0.39, FEF 25-75 percent 0.26/9%  -----------------------------------------------------------------------------------------   02/28/21-Virtual Visit via Video Note  I connected with [name] on 02/28/21 at  4:00 PM EST by a video enabled telemedicine application and verified that I am speaking with the correct person using two identifiers.  Location: Patient: home Provider: office   I discussed the limitations of evaluation and management by telemedicine and the availability of in person appointments. The patient expressed understanding and agreed to proceed.  History of Present Illness: 57 year old female Smoker followed for COPD mixed type, lung nodules, Chronic resp failure hypoxic, seasonal allergic rhinitis, tobacco abuse, complicated by hip surgery/infection/repair, anxiety/depression, GERD, HBP, obesity, O2 2-3 L continuous  Incruse, Singulair, Claritin,  Advair 500, Chantix, albuterol hfa, neb albuterol,  Latest hip surgery 02/10/18- repeated complications. She doesn't intend to allow any more hip surgery. Admits to 2 cigarettes/day and using Chantix. Choked on a piece of egg biscuit and evaluated for aspiration pneumonia at [facility].  She says she eventually coughed out the piece of biscuit.  CT chest was done.  Treated with Flagyl, Levaquin and  prednisone. Says her usual oxygen saturation on 3 L is about 94% but this morning 90%.  She has a nebulizer machine and a flutter device.  Still coughing up some white phlegm. She expressed concern that she might need to extend antibiotic longer.   Observations/Objective: Light cough evident during video conversation  Assessment and Plan: Aspiration pneumonia COPD Chronic respiratory failure with hypoxia  Follow Up Instructions: Augmentin, CXR   I discussed the assessment and treatment plan with the patient. The patient was provided an opportunity to ask questions and all were answered. The patient agreed with the plan and demonstrated an understanding of the instructions.   The patient was advised to call back or seek an in-person evaluation if the symptoms worsen or if the condition fails to improve as anticipated.  I provided 20 minutes of non-face-to-face time during this encounter.   Jetty Duhamel, MD  12/16/22- 57 year old female Smoker followed for COPD mixed type, lung nodules, Chronic resp failure hypoxic, seasonal allergic rhinitis, tobacco abuse, complicated by hip surgery/infection/repair, anxiety/depression, GERD, HBP, obesity, O2 2-3 L continuous  -Incruse, Singulair, Claritin,  Advair 500, Chantix, albuterol hfa, neb albuterol,  Latest hip surgery 02/10/18- repeated complications. She doesn't intend to allow any more hip surgery. Admits to 2 cigarettes/day and using Chantix.  -------------------------------------  ROS-see HPI     + = positive Constitutional:   No-   weight loss, night sweats, fevers, chills, fatigue, lassitude. HEENT:   No-  headaches, difficulty swallowing, tooth/dental problems, no-sore throat,       No- sneezing, no-itching, ear ache, +nasal congestion,+ post nasal drip,  CV:  No-   chest pain, orthopnea, PND,+ swelling in lower extremities, no-anasarca,  dizziness, palpitations Resp: +  shortness of breath with exertion or at rest.             + productive cough, + non-productive cough,  No- coughing up of blood.              No-   change in color of mucus.  No- wheezing.   Skin: No-   rash or lesions. GI:  + heartburn, indigestion, No-abdominal pain, nausea, vomiting,  GU:  MS:  + joint pain or swelling.   Neuro-     nothing unusual Psych:  No- change in mood or affect. + depression or anxiety.  No memory loss.  OBJ- Physical Exam General- Alert, Oriented, Affect-appropriate, Distress- none acute. + Overweight, + talkative + wheelchair,                                     +O2 Skin- rash-none, lesions- none, excoriation- none Lymphadenopathy- none Head- atraumatic            Eyes- Gross vision intact, PERRLA, conjunctivae and secretions clear            Ears- Hearing, canals-normal            Nose- + turbinate edema, no-Septal dev, +mucus bridging, polyps, erosion, perforation             Throat- Mallampati III , mucosa clear , drainage- none, tonsils- atrophic, + dentures,  Neck- flexible , trachea midline, no stridor , thyroid nl, carotid no bruit Chest - symmetrical excursion , unlabored           Heart/CV- RRR , no murmur , no gallop  , no rub, nl s1 s2                           - JVD- none , edema- none, stasis changes- none, varices- none           Lung-  wheeze + coarse/unlabored, cough+ upper airway rattle,  dullness-none, rub- none           Chest wall-  Abd-  Br/ Gen/ Rectal- Not done, not indicated Extrem- cyanosis- none, clubbing, none, atrophy- none, strength- nl, + wheelchair Neuro- grossly intact to observation

## 2022-12-16 ENCOUNTER — Institutional Professional Consult (permissible substitution): Payer: Medicare (Managed Care) | Admitting: Internal Medicine

## 2022-12-16 ENCOUNTER — Telehealth: Payer: Self-pay | Admitting: Internal Medicine

## 2022-12-16 NOTE — Telephone Encounter (Signed)
Called patient's husband back to ensure that EMS was called. He stated that EMS was there currently and will be taking patient to [facility]. He stated that spo2 was lower then 55% when EMS arrived.  Nothing further needed.

## 2022-12-16 NOTE — Telephone Encounter (Signed)
Adv PT to go to ER. States she is missing appt due to 02 levels in the 30's and can not walk. We have not seen since 2014.

## 2022-12-16 NOTE — Telephone Encounter (Signed)
Patient last seen 02/28/2021.   Attempted to speak with patient. She was exteremly SOB and unable to speak with me. She handed the phone over to her husband. I asked that he check patient spo2. Spo2 55% on 4L during phone call. I instructed patient's husband to call EMS. Instructed patient to remain seated, increased oxygen to 5L and do purse lip breathing. Patient has a Product manager. Unable to increased liter flow any higher.  Routing to Dr. Maple Hudson as an Lorain Childes.

## 2022-12-22 ENCOUNTER — Telehealth: Payer: Self-pay

## 2022-12-22 NOTE — Transitions of Care (Post Inpatient/ED Visit) (Unsigned)
   12/22/2022  Name: [name] MRN: [id number] DOB: 07-01-1965  Today's TOC FU Call Status: Today's TOC FU Call Status:: Unsuccessful Call (1st Attempt) Unsuccessful Call (1st Attempt) Date: 12/22/22  Attempted to reach the patient regarding the most recent Inpatient/ED visit.  Follow Up Plan: Additional outreach attempts will be made to reach the patient to complete the Transitions of Care (Post Inpatient/ED visit) call.   Signature Karena Addison, LPN [facility] Nurse Health Advisor Direct Dial [phone]

## 2022-12-23 NOTE — Transitions of Care (Post Inpatient/ED Visit) (Unsigned)
   12/23/2022  Name: [name] MRN: [id number] DOB: 08-22-1965  Today's TOC FU Call Status: Today's TOC FU Call Status:: Unsuccessful Call (2nd Attempt) Unsuccessful Call (1st Attempt) Date: 12/22/22 Unsuccessful Call (2nd Attempt) Date: 12/23/22  Attempted to reach the patient regarding the most recent Inpatient/ED visit.  Follow Up Plan: Additional outreach attempts will be made to reach the patient to complete the Transitions of Care (Post Inpatient/ED visit) call.   Signature Karena Addison, LPN [facility] Nurse Health Advisor Direct Dial [phone]

## 2022-12-24 NOTE — Transitions of Care (Post Inpatient/ED Visit) (Signed)
12/24/2022  Name: [name] MRN: [id number] DOB: 1965-03-14  Today's TOC FU Call Status: Today's TOC FU Call Status:: Unsuccessful Call (2nd Attempt) Unsuccessful Call (1st Attempt) Date: 12/22/22 Unsuccessful Call (2nd Attempt) Date: 12/23/22 Patient's Name and Date of Birth confirmed.  Transition Care Management Follow-up Telephone Call Date of Discharge: 12/24/22 Discharge Facility: Other ([facility]) Name of Other ([facility]) Discharge Facility: [facility] Type of Discharge: Inpatient Admission Primary Inpatient Discharge Diagnosis:: COPD, pneumonia How have you been since you were released from the hospital?: Same Any questions or concerns?: Yes Patient Questions/Concerns:: needs larger concentrator than will allow her to turn  oxygen up to 5L. she uses [facility]. she will call Dr Adolphus Birchwood  Items Reviewed: Did you receive and understand the discharge instructions provided?: Yes Medications obtained,verified, and reconciled?: Yes (Medications Reviewed) Any new allergies since your discharge?: No Dietary orders reviewed?: Yes Do you have support at home?: Yes People in Home: spouse  Medications Reviewed Today: Medications Reviewed Today     Reviewed by Karena Addison, LPN (Licensed Practical Nurse) on 12/24/22 at 1136  Med List Status: <None>   Medication Order Taking? Sig Documenting Provider Last Dose Status Informant  albuterol (PROVENTIL) (2.5 MG/3ML) 0.083% nebulizer solution [id number] Yes USE 1 VIAL IN NEBULIZER EVERY 4 HOURS Young, Clinton D, MD Taking Active   albuterol (VENTOLIN HFA) 108 (90 Base) MCG/ACT inhaler [id number] Yes INHALE 2 PUFFS INTO THE LUNGS EVERY 6 HOURS AS NEEDED FOR WHEEZING OR SHORTNESS OF BREATH Young, Clinton D, MD Taking Active   amLODipine (NORVASC) 10 MG tablet [id number] Yes Take 1 tablet (10 mg total) by mouth at bedtime. Lilland, Alana, DO Taking Active   amLODipine (NORVASC) 10 MG tablet [id number] Yes Take 1 tablet (10 mg  total) by mouth daily. Lilland, Alana, DO Taking Active   amoxicillin-clavulanate (AUGMENTIN) 875-125 MG tablet [id number] No Take 1 tablet by mouth 2 (two) times daily.  Patient not taking: Reported on 12/24/2022   Jetty Duhamel D, MD Not Taking Active   aspirin 81 MG chewable tablet [id number] Yes Chew by mouth daily. [provider] Taking Active   carvedilol (COREG) 3.125 MG tablet [id number] Yes Take 1 tablet (3.125 mg total) by mouth 2 (two) times daily with a meal. Lilland, Alana, DO Taking Active   doxycycline (VIBRA-TABS) 100 MG tablet [id number] Yes Take 100 mg by mouth 2 (two) times daily. [provider] Taking Active   ferrous sulfate 325 (65 FE) MG EC tablet [id number]  Take 1 tablet by mouth 2 (two) times daily. [provider]  Expired 05/12/16 2359            Med Note Yetta Barre, CRYSTAL D   Fri Apr 13, 2015 10:27 AM) Received from: [facility] Received Sig: Take by mouth.  fluticasone (FLONASE) 50 MCG/ACT nasal spray [id number] Yes INSTILL 1 SPRAY INTO EACH NOSTRIL TWICE DAILY Leland Her, DO Taking Active   fluticasone-salmeterol (ADVAIR) 500-50 MCG/ACT AEPB [id number] Yes INHALE 1 PUFF INTO THE LUNGS TWICE DAILY Young, Rennis Chris, MD Taking Active   furosemide (LASIX) 40 MG tablet [id number] Yes Take 1 tablet (40 mg total) by mouth daily as needed. Lilland, Alana, DO Taking Active   loratadine (CLARITIN) 10 MG tablet [id number] Yes Take 1 tablet (10 mg total) by mouth daily. Carney Living, MD Taking Active   loratadine (CLARITIN) 10 MG tablet [id number] Yes Take by mouth. [provider] Taking Active   methocarbamol (ROBAXIN) 750 MG tablet [id number] Yes Take  750 mg by mouth 3 (three) times daily. [provider] Taking Active   montelukast (SINGULAIR) 10 MG tablet [id number] Yes Take by mouth. [provider] Taking Active   montelukast (SINGULAIR) 10 MG tablet [id number] Yes TAKE 1 TABLET BY MOUTH AT BEDTIME. Waymon Budge, MD Taking Active   Multiple Vitamin (MULTIVITAMIN) capsule [id number] Yes Take 1 capsule by mouth daily. [provider] Taking Active   mupirocin ointment (BACTROBAN) 2 % [id number] Yes APPLY TO AFFECTED AREA(S) INTO THE NOSE 2 TIMES DAILY Lilland, Alana, DO Taking Active   omeprazole (PRILOSEC) 20 MG capsule [id number]  Take 1 capsule (20 mg total) by mouth daily. Lilland, Alana, DO  Expired 11/16/22 2359   OVER THE COUNTER MEDICATION [id number] Yes Take 1 tablet by mouth every 12 (twelve) hours. mucus relief [provider] Taking Active   OVER THE COUNTER MEDICATION [id number] Yes Take by mouth every 6 (six) hours as needed. Store brand niquil, 1 tablespoon ever 6 hours. [provider] Taking Active   oxyCODONE (OXYCONTIN) 20 mg 12 hr tablet [id number] Yes Take 10 mg by mouth.  [provider] Taking Active   OXYGEN [id number] Yes Place 2 L/min into the nose as directed. Oxygen via [place] @@ 2L/min on exertion with stationary concentrator and portable gas tank. [provider] Taking Active   potassium chloride SA (KLOR-CON) 20 MEQ tablet [id number] Yes Take by mouth. [provider] Taking Active   predniSONE (DELTASONE) 10 MG tablet [id number] Yes 4 X 2 DAYS, 3 X 2 DAYS, 2 X 2 DAYS, 1 X 2 DAYS Young, Clinton D, MD Taking Active   TRAVATAN Z 0.004 % SOLN ophthalmic solution [id number] Yes Place 1 drop into both eyes at bedtime. [provider] Taking Active   umeclidinium bromide (INCRUSE ELLIPTA) 62.5 MCG/ACT AEPB [id number] Yes INHALE 1 PUFF INTO THE LUNGS DAILY Waymon Budge, MD Taking Active   varenicline (CHANTIX) 1 MG tablet [id number] Yes TAKE 1 TABLET BY MOUTH TWICE DAILY Waymon Budge, MD Taking Active             Home Care and Equipment/Supplies: Were Home Health Services Ordered?: NA Any new equipment or medical supplies ordered?: NA  Functional Questionnaire: Do you need assistance with bathing/showering or  dressing?: Yes Do you need assistance with meal preparation?: Yes Do you need assistance with eating?: No Do you have difficulty maintaining continence: No Do you need assistance with getting out of bed/getting out of a chair/moving?: Yes Do you have difficulty managing or taking your medications?: No  Follow up appointments reviewed: PCP Follow-up appointment confirmed?: No (needs video appt . can't get to office because of decreased O2 level when walks) Specialist Hospital Follow-up appointment confirmed?: No Reason Specialist Follow-Up Not Confirmed: Patient has Specialist Provider Number and will Call for Appointment Do you need transportation to your follow-up appointment?: No Do you understand care options if your condition(s) worsen?: Yes-patient verbalized understanding  advised patient to call Dr Maple Hudson her pulmo for appt and new order for O2 concentrator. Patient states understanding and is agreeable.  SIGNATURE Karena Addison, LPN [facility] Nurse Health Advisor Direct Dial [phone]

## 2023-01-12 ENCOUNTER — Telehealth: Payer: Self-pay

## 2023-02-08 NOTE — Telephone Encounter (Signed)
Received call from [facility].   Patient passed away this morning.   [name] is requesting PCP information for death certificate.  Information given.

## 2023-02-08 DEATH — deceased
